# Patient Record
Sex: Female | Born: 1960 | Race: White | Hispanic: No | Marital: Single | Smoking: Never smoker
Health system: Southern US, Community
[De-identification: ages and names within clinical notes are randomized; demographics above are authoritative.]

## PROBLEM LIST (undated history)

## (undated) DIAGNOSIS — I10 Essential (primary) hypertension: Secondary | ICD-10-CM

## (undated) DIAGNOSIS — F329 Major depressive disorder, single episode, unspecified: Secondary | ICD-10-CM

## (undated) DIAGNOSIS — M1990 Unspecified osteoarthritis, unspecified site: Secondary | ICD-10-CM

## (undated) DIAGNOSIS — G43909 Migraine, unspecified, not intractable, without status migrainosus: Secondary | ICD-10-CM

## (undated) DIAGNOSIS — H269 Unspecified cataract: Secondary | ICD-10-CM

## (undated) DIAGNOSIS — Z8669 Personal history of other diseases of the nervous system and sense organs: Secondary | ICD-10-CM

## (undated) DIAGNOSIS — F32A Depression, unspecified: Secondary | ICD-10-CM

## (undated) DIAGNOSIS — Z9849 Cataract extraction status, unspecified eye: Secondary | ICD-10-CM

## (undated) DIAGNOSIS — K219 Gastro-esophageal reflux disease without esophagitis: Secondary | ICD-10-CM

## (undated) DIAGNOSIS — Z9884 Bariatric surgery status: Secondary | ICD-10-CM

## (undated) DIAGNOSIS — M25569 Pain in unspecified knee: Secondary | ICD-10-CM

## (undated) DIAGNOSIS — E559 Vitamin D deficiency, unspecified: Secondary | ICD-10-CM

## (undated) HISTORY — DX: Pain in unspecified knee: M25.569

## (undated) HISTORY — DX: Unspecified osteoarthritis, unspecified site: M19.90

## (undated) HISTORY — DX: Vitamin D deficiency, unspecified: E55.9

## (undated) HISTORY — DX: Bariatric surgery status: Z98.84

## (undated) HISTORY — DX: Essential (primary) hypertension: I10

## (undated) HISTORY — DX: Migraine, unspecified, not intractable, without status migrainosus: G43.909

## (undated) HISTORY — PX: BARIATRIC SURGERY: SHX1103

## (undated) HISTORY — PX: KNEE SURGERY: SHX244

## (undated) HISTORY — DX: Personal history of other diseases of the nervous system and sense organs: Z86.69

## (undated) HISTORY — PX: WRIST SURGERY: SHX841

## (undated) HISTORY — DX: Major depressive disorder, single episode, unspecified: F32.9

## (undated) HISTORY — DX: Cataract extraction status, unspecified eye: Z98.49

## (undated) HISTORY — DX: Depression, unspecified: F32.A

## (undated) HISTORY — PX: TONSILLECTOMY: SUR1361

## (undated) HISTORY — DX: Gastro-esophageal reflux disease without esophagitis: K21.9

## (undated) HISTORY — PX: ABDOMINAL HYSTERECTOMY: SHX81

## (undated) HISTORY — DX: Unspecified cataract: H26.9

---

## 2007-07-26 DIAGNOSIS — F32A Depression, unspecified: Secondary | ICD-10-CM | POA: Insufficient documentation

## 2007-07-26 DIAGNOSIS — F329 Major depressive disorder, single episode, unspecified: Secondary | ICD-10-CM | POA: Insufficient documentation

## 2007-07-26 DIAGNOSIS — K279 Peptic ulcer, site unspecified, unspecified as acute or chronic, without hemorrhage or perforation: Secondary | ICD-10-CM | POA: Insufficient documentation

## 2007-07-27 DIAGNOSIS — M1990 Unspecified osteoarthritis, unspecified site: Secondary | ICD-10-CM

## 2007-07-27 HISTORY — DX: Unspecified osteoarthritis, unspecified site: M19.90

## 2007-10-27 DIAGNOSIS — K219 Gastro-esophageal reflux disease without esophagitis: Secondary | ICD-10-CM | POA: Insufficient documentation

## 2007-10-27 HISTORY — DX: Gastro-esophageal reflux disease without esophagitis: K21.9

## 2009-02-08 DIAGNOSIS — J309 Allergic rhinitis, unspecified: Secondary | ICD-10-CM | POA: Insufficient documentation

## 2010-05-22 DIAGNOSIS — I10 Essential (primary) hypertension: Secondary | ICD-10-CM | POA: Insufficient documentation

## 2010-05-22 DIAGNOSIS — E119 Type 2 diabetes mellitus without complications: Secondary | ICD-10-CM | POA: Insufficient documentation

## 2010-05-22 DIAGNOSIS — R7301 Impaired fasting glucose: Secondary | ICD-10-CM | POA: Insufficient documentation

## 2011-01-28 DIAGNOSIS — G43009 Migraine without aura, not intractable, without status migrainosus: Secondary | ICD-10-CM | POA: Insufficient documentation

## 2011-01-28 DIAGNOSIS — IMO0002 Reserved for concepts with insufficient information to code with codable children: Secondary | ICD-10-CM | POA: Insufficient documentation

## 2011-04-20 ENCOUNTER — Ambulatory Visit: Payer: Self-pay | Admitting: Family Medicine

## 2011-05-30 DIAGNOSIS — M25569 Pain in unspecified knee: Secondary | ICD-10-CM

## 2011-05-30 DIAGNOSIS — Z9884 Bariatric surgery status: Secondary | ICD-10-CM | POA: Insufficient documentation

## 2011-05-30 HISTORY — DX: Pain in unspecified knee: M25.569

## 2011-12-02 DIAGNOSIS — E785 Hyperlipidemia, unspecified: Secondary | ICD-10-CM | POA: Insufficient documentation

## 2012-10-12 DIAGNOSIS — E559 Vitamin D deficiency, unspecified: Secondary | ICD-10-CM

## 2012-10-12 HISTORY — DX: Vitamin D deficiency, unspecified: E55.9

## 2013-11-15 DIAGNOSIS — D509 Iron deficiency anemia, unspecified: Secondary | ICD-10-CM | POA: Insufficient documentation

## 2013-11-15 DIAGNOSIS — M170 Bilateral primary osteoarthritis of knee: Secondary | ICD-10-CM

## 2013-11-20 ENCOUNTER — Ambulatory Visit: Payer: Self-pay | Admitting: Family Medicine

## 2013-11-25 ENCOUNTER — Ambulatory Visit: Payer: Self-pay | Admitting: Orthopedic Surgery

## 2014-11-24 ENCOUNTER — Ambulatory Visit: Payer: Self-pay | Admitting: Emergency Medicine

## 2014-12-26 DIAGNOSIS — IMO0002 Reserved for concepts with insufficient information to code with codable children: Secondary | ICD-10-CM | POA: Insufficient documentation

## 2014-12-26 DIAGNOSIS — H2513 Age-related nuclear cataract, bilateral: Secondary | ICD-10-CM | POA: Insufficient documentation

## 2014-12-26 DIAGNOSIS — H25043 Posterior subcapsular polar age-related cataract, bilateral: Secondary | ICD-10-CM | POA: Insufficient documentation

## 2014-12-26 DIAGNOSIS — H1851 Endothelial corneal dystrophy: Secondary | ICD-10-CM

## 2014-12-26 DIAGNOSIS — H18519 Endothelial corneal dystrophy, unspecified eye: Secondary | ICD-10-CM | POA: Insufficient documentation

## 2014-12-26 DIAGNOSIS — H3530 Unspecified macular degeneration: Secondary | ICD-10-CM | POA: Insufficient documentation

## 2015-01-20 DIAGNOSIS — Z961 Presence of intraocular lens: Secondary | ICD-10-CM | POA: Insufficient documentation

## 2015-01-20 DIAGNOSIS — Z9841 Cataract extraction status, right eye: Secondary | ICD-10-CM

## 2015-01-20 DIAGNOSIS — Z9849 Cataract extraction status, unspecified eye: Secondary | ICD-10-CM

## 2015-01-20 HISTORY — DX: Cataract extraction status, unspecified eye: Z98.49

## 2015-02-10 DIAGNOSIS — Z9842 Cataract extraction status, left eye: Secondary | ICD-10-CM

## 2015-02-10 DIAGNOSIS — Z961 Presence of intraocular lens: Secondary | ICD-10-CM | POA: Insufficient documentation

## 2015-08-30 ENCOUNTER — Encounter: Payer: Self-pay | Admitting: Pain Medicine

## 2015-08-30 ENCOUNTER — Ambulatory Visit: Payer: BC Managed Care – PPO | Attending: Pain Medicine | Admitting: Pain Medicine

## 2015-08-30 VITALS — BP 140/89 | HR 73 | Temp 98.8°F | Resp 18 | Ht 66.0 in | Wt 235.0 lb

## 2015-08-30 DIAGNOSIS — M25562 Pain in left knee: Secondary | ICD-10-CM | POA: Insufficient documentation

## 2015-08-30 DIAGNOSIS — M159 Polyosteoarthritis, unspecified: Secondary | ICD-10-CM | POA: Insufficient documentation

## 2015-08-30 DIAGNOSIS — K219 Gastro-esophageal reflux disease without esophagitis: Secondary | ICD-10-CM | POA: Diagnosis not present

## 2015-08-30 DIAGNOSIS — G8929 Other chronic pain: Secondary | ICD-10-CM

## 2015-08-30 DIAGNOSIS — F1190 Opioid use, unspecified, uncomplicated: Secondary | ICD-10-CM | POA: Insufficient documentation

## 2015-08-30 DIAGNOSIS — Z79899 Other long term (current) drug therapy: Secondary | ICD-10-CM

## 2015-08-30 DIAGNOSIS — M6249 Contracture of muscle, multiple sites: Secondary | ICD-10-CM

## 2015-08-30 DIAGNOSIS — M25561 Pain in right knee: Secondary | ICD-10-CM | POA: Insufficient documentation

## 2015-08-30 DIAGNOSIS — Z8669 Personal history of other diseases of the nervous system and sense organs: Secondary | ICD-10-CM

## 2015-08-30 DIAGNOSIS — M62838 Other muscle spasm: Secondary | ICD-10-CM | POA: Insufficient documentation

## 2015-08-30 DIAGNOSIS — F1120 Opioid dependence, uncomplicated: Secondary | ICD-10-CM

## 2015-08-30 DIAGNOSIS — M150 Primary generalized (osteo)arthritis: Secondary | ICD-10-CM

## 2015-08-30 DIAGNOSIS — G43909 Migraine, unspecified, not intractable, without status migrainosus: Secondary | ICD-10-CM | POA: Insufficient documentation

## 2015-08-30 DIAGNOSIS — I10 Essential (primary) hypertension: Secondary | ICD-10-CM | POA: Diagnosis not present

## 2015-08-30 DIAGNOSIS — Z9884 Bariatric surgery status: Secondary | ICD-10-CM | POA: Diagnosis not present

## 2015-08-30 DIAGNOSIS — M7918 Myalgia, other site: Secondary | ICD-10-CM | POA: Insufficient documentation

## 2015-08-30 DIAGNOSIS — M25569 Pain in unspecified knee: Secondary | ICD-10-CM | POA: Insufficient documentation

## 2015-08-30 DIAGNOSIS — Z0289 Encounter for other administrative examinations: Secondary | ICD-10-CM

## 2015-08-30 DIAGNOSIS — Z5181 Encounter for therapeutic drug level monitoring: Secondary | ICD-10-CM | POA: Insufficient documentation

## 2015-08-30 DIAGNOSIS — Z6841 Body Mass Index (BMI) 40.0 and over, adult: Secondary | ICD-10-CM | POA: Insufficient documentation

## 2015-08-30 DIAGNOSIS — F329 Major depressive disorder, single episode, unspecified: Secondary | ICD-10-CM | POA: Diagnosis not present

## 2015-08-30 DIAGNOSIS — M791 Myalgia: Secondary | ICD-10-CM

## 2015-08-30 DIAGNOSIS — Z79891 Long term (current) use of opiate analgesic: Secondary | ICD-10-CM | POA: Insufficient documentation

## 2015-08-30 DIAGNOSIS — F1990 Other psychoactive substance use, unspecified, uncomplicated: Secondary | ICD-10-CM

## 2015-08-30 HISTORY — DX: Personal history of other diseases of the nervous system and sense organs: Z86.69

## 2015-08-30 HISTORY — DX: Bariatric surgery status: Z98.84

## 2015-08-30 MED ORDER — HYDROCODONE-ACETAMINOPHEN 7.5-325 MG PO TABS: 1.0000 | ORAL_TABLET | Freq: Three times a day (TID) | ORAL | Status: DC | PRN

## 2015-08-30 NOTE — Progress Notes (Signed)
Safety precautions to be maintained throughout the outpatient stay will include: orient to surroundings, keep bed in low position, maintain call bell within reach at all times, provide assistance with transfer out of bed and ambulation. Hydrocodone pill count # [phone]

## 2015-08-30 NOTE — Patient Instructions (Signed)
Radiofrequency Lesioning Radiofrequency lesioning is a procedure that is performed to relieve pain. The procedure is often used for back, neck, or arm pain. Radiofrequency lesioning involves the use of a machine that creates radio waves to make heat. During the procedure, the heat is applied to the nerve that carries the pain signal. The heat damages the nerve and interferes with the pain signal. Pain relief usually lasts for 6 months to 1 year. LET YOUR HEALTH CARE PROVIDER KNOW ABOUT:  Any allergies you have.  All medicines you are taking, including vitamins, herbs, eye drops, creams, and over-the-counter medicines.  Previous problems you or members of your family have had with the use of anesthetics.  Any blood disorders you have.  Previous surgeries you have had.  Any medical conditions you have.  Whether you are pregnant or may be pregnant. RISKS AND COMPLICATIONS Generally, this is a safe procedure. However, problems may occur, including:  Pain or soreness at the injection site.  Infection at the injection site.  Damage to nerves or blood vessels. BEFORE THE PROCEDURE  Ask your health care provider about:  Changing or stopping your regular medicines. This is especially important if you are taking diabetes medicines or blood thinners.  Taking medicines such as aspirin and ibuprofen. These medicines can thin your blood. Do not take these medicines before your procedure if your health care provider instructs you not to.  Follow instructions from your health care provider about eating or drinking restrictions.  Plan to have someone take you home after the procedure.  If you go home right after the procedure, plan to have someone with you for 24 hours. PROCEDURE  You will be given one or more of the following:  A medicine to help you relax (sedative).  A medicine to numb the area (local anesthetic).  You will be awake during the procedure. You will need to be able to  talk with the health care provider during the procedure.  With the help of a type of X-ray (fluoroscopy), the health care provider will insert a radiofrequency needle into the area to be treated.  Next, a wire that carries the radio waves (electrode) will be put through the radiofrequency needle. An electrical pulse will be sent through the electrode to verify the correct nerve. You will feel a tingling sensation, and you may have muscle twitching.  Then, the tissue that is around the needle tip will be heated by an electric current that is passed using the radiofrequency machine. This will numb the nerves.  A bandage (dressing) will be put on the insertion area after the procedure is done. The procedure may vary among health care providers and hospitals. AFTER THE PROCEDURE  Your blood pressure, heart rate, breathing rate, and blood oxygen level will be monitored often until the medicines you were given have worn off.  Return to your normal activities as directed by your health care provider.   This information is not intended to replace advice given to you by your health care provider. Make sure you discuss any questions you have with your health care provider.   Document Released: 06/19/2011 Document Revised: 07/12/2015 Document Reviewed: 11/28/2014 Elsevier Interactive Patient Education 2016 [employer] Inc[employer]  

## 2015-08-30 NOTE — Progress Notes (Signed)
Patient's Name: Saloma A Pett MRN: [id number] MAYEDAebruary 17, 1962 DOS: 08/30/2015  Primary Reason(s) for Visit: Encounter for Medication Management. CC: Joint Swelling   HPI:   Ms. Gerken is a 54 y.o. year old, female patient, who returns today as an established patient. She has Chronic pain; Opiate use; Opiate dependence (HCC); Long term prescription opiate use; Long term current use of opiate analgesic; Encounter for therapeutic drug level monitoring; Bilateral chronic knee pain; Status post bariatric surgery; Morbid obesity (HCC); Osteoarthrosis; History of migraine; Substance Use Disorder Risk:; Cataract, nuclear; Allergic rhinitis; Age-related macular degeneration; Arthritis; Atypical migraine; Cornea guttata; Clinical depression; Acid reflux; Bariatric surgery status; HLD (hyperlipidemia); BP (high blood pressure); Gonalgia; Arthritis of knee, degenerative; Gastroduodenal ulcer; Avitaminosis D; H/O cataract extraction; Cataract, post subcapsular polar senile; Opioid use agreement exists; Myofascial pain; Muscle spasms of lower extremity; and GERD (gastroesophageal reflux disease) on her problem list.. Her primarily concern today is the Joint Swelling   The patient returns to the clinic today after having been seen by the medical psychologist for the substance use disorder evaluation. The evaluation came back indicating that her risk is low. Today we have signed and an opioid contract with the patient and we have informed the patient about the rules and regulations of the medication management program. The patient has an orthopedic surgeon that we will be following her just in case she gets to the point where she may need some knee replacements. She was also seen a sports medicine physician that was doing her injections. However, because were now taking care of her medication, we will take over all of her interventional treatments as well. The patient was informed not to go back to have any more Synvisc  injections, we will take over and do those here from now on. Today we talked about different alternatives such as steroid injections, viscoelastic treatments, genicular nerve blocks, genicular nerve RFA, and even spinal cord stimulation as possible options. Following this I went ahead and also spoke to her about her medication program. For now she indicates that she is doing well and does not even need any injections. Today we will provide her with enough medication to last for 3 months at which time we'll see her back for reevaluation. Today I also spoke to the patient about drug holidays as a way to treat medication tolerance.  Pharmacotherapy Review: Side-effects or Adverse reactions: None reported. Effectiveness: Described as relatively effective, allowing for increase in activities of daily living (ADL). Onset of action: Within expected pharmacological parameters. Duration of action: Within normal limits for medication. Peak effect: Timing and results are as within normal expected parameters. [place] PMP: Compliant with practice rules and regulations. DST: Compliant with practice rules and regulations. Lab work: No new labs ordered by our practice. Treatment compliance: Compliant. Substance Use Disorder (SUD) Risk Level: Low Planned course of action: Continue therapy as is.  Allergies: Ms. Lombard is allergic to diphenhydramine; morphine and related; nsaids; tetanus toxoids; morphine; and naproxen.  Meds: The patient has a current medication list which includes the following prescription(s): amlodipine, atenolol, calcium carbonate antacid, vitamin d, hydrocodone-acetaminophen, losartan-hydrochlorothiazide, magnesium, magnesium-chelated zinc, multivitamin, nortriptyline, omeprazole, sertraline, tizanidine, vitamin b-12, hydrocodone-acetaminophen, and hydrocodone-acetaminophen. Requested Prescriptions   Signed Prescriptions Disp Refills  . HYDROcodone-acetaminophen (NORCO) 7.5-325 MG tablet 90  tablet 0    Sig: Take 1 tablet by mouth every 8 (eight) hours as needed for moderate pain.  Marland Kitchen HYDROcodone-acetaminophen (NORCO) 7.5-325 MG tablet 90 tablet 0    Sig: Take 1 tablet  by mouth every 8 (eight) hours as needed for moderate pain.    ROS: Constitutional: Afebrile, no chills, well hydrated and well nourished Gastrointestinal: negative Musculoskeletal:negative Neurological: negative Behavioral/Psych: negative  PFSH: Medical:  Ms. [facility]  has a past medical history of Osteoarthritis; Cataract; Depression; Hypertension; Migraines; Status post bariatric surgery (08/30/2015); and History of migraine (08/30/2015). Family: family history includes Cancer in her mother; Heart disease in her father. Surgical:  has past surgical history that includes Abdominal hysterectomy; Bariatric Surgery; Wrist surgery; Knee surgery; and Tonsillectomy. Tobacco:  reports that she has never smoked. She does not have any smokeless tobacco history on file. Alcohol:  reports that she does not drink alcohol. Drug:  reports that she does not use illicit drugs.  Physical Exam: Vitals:  Today's Vitals   08/30/15 0751 08/30/15 0753  BP: 140/89   Pulse: 73   Temp: 98.8 F (37.1 C)   Resp: 18   Height:  (1.676 m)   Weight: 235 lb (106.595 kg)   SpO2: 100%   PainSc: 4  4   PainLoc: Knee   Calculated BMI: Body mass index is 37.95 kg/(m^2). General appearance: alert, cooperative, appears stated age, mild distress and morbidly obese Eyes: conjunctivae/corneas clear. PERRL, EOM's intact. Fundi benign. Lungs: No evidence respiratory distress, no audible rales or ronchi and no use of accessory muscles of respiration Neck: no adenopathy, no carotid bruit, no JVD, supple, symmetrical, trachea midline and thyroid not enlarged, symmetric, no tenderness/mass/nodules Back: symmetric, no curvature. ROM normal. No CVA tenderness. Extremities: extremities normal, atraumatic, no cyanosis or edema Pulses: 2+ and  symmetric Skin: Skin color, texture, turgor normal. No rashes or lesions Neurologic: Grossly normal    Assessment: Encounter Diagnosis:  Primary Diagnosis: Chronic pain [G89.29]  Plan: Meryle was seen today for joint swelling.  Diagnoses and all orders for this visit:  Chronic pain -     HYDROcodone-acetaminophen (NORCO) 7.5-325 MG tablet; Take 1 tablet by mouth every 8 (eight) hours as needed for moderate pain. -     HYDROcodone-acetaminophen (NORCO) 7.5-325 MG tablet; Take 1 tablet by mouth every 8 (eight) hours as needed for moderate pain.  Opiate use  Uncomplicated opioid dependence (HCC)  Long term prescription opiate use  Long term current use of opiate analgesic  Encounter for therapeutic drug level monitoring  Bilateral chronic knee pain -     KNEE INJECTION; Standing  Status post bariatric surgery  Morbid obesity due to excess calories (HCC)  History of migraine  Substance Use Disorder Risk: Low  Opioid use agreement exists  Myofascial pain  Muscle spasms of lower extremity, unspecified laterality  Gastroesophageal reflux disease, esophagitis presence not specified     Patient Instructions  Radiofrequency Lesioning Radiofrequency lesioning is a procedure that is performed to relieve pain. The procedure is often used for back, neck, or arm pain. Radiofrequency lesioning involves the use of a machine that creates radio waves to make heat. During the procedure, the heat is applied to the nerve that carries the pain signal. The heat damages the nerve and interferes with the pain signal. Pain relief usually lasts for 6 months to 1 year. LET [facility] CARE PROVIDER KNOW ABOUT:  Any allergies you have.  All medicines you are taking, including vitamins, herbs, eye drops, creams, and over-the-counter medicines.  Previous problems you or members of your family have had with the use of anesthetics.  Any blood disorders you have.  Previous surgeries you  have had.  Any medical conditions you  have.  Whether you are pregnant or may be pregnant. RISKS AND COMPLICATIONS Generally, this is a safe procedure. However, problems may occur, including:  Pain or soreness at the injection site.  Infection at the injection site.  Damage to nerves or blood vessels. BEFORE THE PROCEDURE  Ask your health care provider about:  Changing or stopping your regular medicines. This is especially important if you are taking diabetes medicines or blood thinners.  Taking medicines such as aspirin and ibuprofen. These medicines can thin your blood. Do not take these medicines before your procedure if your health care provider instructs you not to.  Follow instructions from your health care provider about eating or drinking restrictions.  Plan to have someone take you home after the procedure.  If you go home right after the procedure, plan to have someone with you for 24 hours. PROCEDURE  You will be given one or more of the following:  A medicine to help you relax (sedative).  A medicine to numb the area (local anesthetic).  You will be awake during the procedure. You will need to be able to talk with the health care provider during the procedure.  With the help of a type of X-ray (fluoroscopy), the health care provider will insert a radiofrequency needle into the area to be treated.  Next, a wire that carries the radio waves (electrode) will be put through the radiofrequency needle. An electrical pulse will be sent through the electrode to verify the correct nerve. You will feel a tingling sensation, and you may have muscle twitching.  Then, the tissue that is around the needle tip will be heated by an electric current that is passed using the radiofrequency machine. This will numb the nerves.  A bandage (dressing) will be put on the insertion area after the procedure is done. The procedure may vary among health care providers and hospitals. AFTER  THE PROCEDURE  Your blood pressure, heart rate, breathing rate, and blood oxygen level will be monitored often until the medicines you were given have worn off.  Return to your normal activities as directed by your health care provider.   This information is not intended to replace advice given to you by your health care provider. Make sure you discuss any questions you have with your health care provider.   Document Released: 06/19/2011 Document Revised: 07/12/2015 Document Reviewed: 11/28/2014 Elsevier Interactive Patient Education 2016 [employer]    Medications discontinued today:  Medications Discontinued During This Encounter  Medication Reason  . Magnesium 400 MG TABS Error  . sertraline (ZOLOFT) 20 MG/ML concentrated solution Error   Medications administered today:  Ms. Fredric MareBailey had no medications administered during this visit.  Primary Care Physician: Michelene GardenerSANDBULTE, ZACHARY W, MD [facility] Note by: Sydnee LevansFrancisco A. Laban EmperorNaveira, M.D, DABA, DABAPM, DABPM, DABIPP, FIPP

## 2015-11-22 ENCOUNTER — Ambulatory Visit: Payer: BC Managed Care – PPO | Attending: Pain Medicine | Admitting: Pain Medicine

## 2015-11-22 ENCOUNTER — Other Ambulatory Visit: Payer: Self-pay | Admitting: Pain Medicine

## 2015-11-22 ENCOUNTER — Encounter: Payer: Self-pay | Admitting: Pain Medicine

## 2015-11-22 VITALS — BP 133/89 | HR 76 | Temp 98.6°F | Resp 16 | Ht 66.0 in | Wt 240.0 lb

## 2015-11-22 DIAGNOSIS — M170 Bilateral primary osteoarthritis of knee: Secondary | ICD-10-CM

## 2015-11-22 DIAGNOSIS — Z5181 Encounter for therapeutic drug level monitoring: Secondary | ICD-10-CM

## 2015-11-22 DIAGNOSIS — M25561 Pain in right knee: Secondary | ICD-10-CM | POA: Diagnosis not present

## 2015-11-22 DIAGNOSIS — M25562 Pain in left knee: Secondary | ICD-10-CM

## 2015-11-22 DIAGNOSIS — F329 Major depressive disorder, single episode, unspecified: Secondary | ICD-10-CM | POA: Diagnosis not present

## 2015-11-22 DIAGNOSIS — E559 Vitamin D deficiency, unspecified: Secondary | ICD-10-CM | POA: Diagnosis not present

## 2015-11-22 DIAGNOSIS — Z9884 Bariatric surgery status: Secondary | ICD-10-CM | POA: Diagnosis not present

## 2015-11-22 DIAGNOSIS — E785 Hyperlipidemia, unspecified: Secondary | ICD-10-CM | POA: Diagnosis not present

## 2015-11-22 DIAGNOSIS — M25539 Pain in unspecified wrist: Secondary | ICD-10-CM | POA: Diagnosis present

## 2015-11-22 DIAGNOSIS — M25569 Pain in unspecified knee: Secondary | ICD-10-CM | POA: Diagnosis present

## 2015-11-22 DIAGNOSIS — I10 Essential (primary) hypertension: Secondary | ICD-10-CM | POA: Insufficient documentation

## 2015-11-22 DIAGNOSIS — G8929 Other chronic pain: Secondary | ICD-10-CM | POA: Insufficient documentation

## 2015-11-22 DIAGNOSIS — Z79891 Long term (current) use of opiate analgesic: Secondary | ICD-10-CM

## 2015-11-22 LAB — COMPREHENSIVE METABOLIC PANEL
ALBUMIN: 4.3 g/dL (ref 3.5–5.0)
ALT: 20 U/L (ref 14–54)
ANION GAP: 10 (ref 5–15)
AST: 23 U/L (ref 15–41)
Alkaline Phosphatase: 114 U/L (ref 38–126)
BUN: 12 mg/dL (ref 6–20)
CO2: 29 mmol/L (ref 22–32)
Calcium: 9.6 mg/dL (ref 8.9–10.3)
Chloride: 102 mmol/L (ref 101–111)
Creatinine, Ser: 0.92 mg/dL (ref 0.44–1.00)
GFR calc Af Amer: >60 mL/min (ref >60)
GFR calc non Af Amer: >60 mL/min (ref >60)
GLUCOSE: 93 mg/dL (ref 65–99)
POTASSIUM: 3.7 mmol/L (ref 3.5–5.1)
SODIUM: 141 mmol/L (ref 135–145)
Total Bilirubin: 0.7 mg/dL (ref 0.3–1.2)
Total Protein: 7.6 g/dL (ref 6.5–8.1)

## 2015-11-22 LAB — MAGNESIUM: Magnesium: 2.2 mg/dL (ref 1.7–2.4)

## 2015-11-22 LAB — SEDIMENTATION RATE: SED RATE: 41 mm/h — AB (ref 0–30)

## 2015-11-22 LAB — C-REACTIVE PROTEIN: CRP: 1.6 mg/dL — ABNORMAL HIGH (ref <1.0)

## 2015-11-22 MED ORDER — HYDROCODONE-ACETAMINOPHEN 7.5-325 MG PO TABS: 1.0000 | ORAL_TABLET | Freq: Three times a day (TID) | ORAL | Status: DC | PRN

## 2015-11-22 NOTE — Progress Notes (Signed)
Safety precautions to be maintained throughout the outpatient stay will include: orient to surroundings, keep bed in low position, maintain call bell within reach at all times, provide assistance with transfer out of bed and ambulation. Hydrocodone pill count # 7

## 2015-11-22 NOTE — Assessment & Plan Note (Signed)
The patient suffers from bilateral knee osteoarthritis with the left being worst on the right. In the past she has had injections with steroids as well as Synvisc. This injection were done by Dr. Tasia Catchings. She has been recommended to have knee replacements. At this point she is trying to avoid surgery as much as possible. The patient has been counseled on loosing weight and bringing her BMI to less than 30. We have also added standing orders for knee injections, should she need those. Along with her medication management we will be taking over all of her interventional injection treatments.

## 2015-11-22 NOTE — Progress Notes (Signed)
Patient's Name: Sophia Jackson MRN: [id number] DOB: 1961-03-14 DOS: 11/22/2015  Primary Reason(s) for Visit: Encounter for Medication Management CC: Knee Pain and Wrist Pain   HPI:  Ms. Talmadge is a 55 y.o. year old, female patient, who returns today as an established patient. She has Chronic pain; Opiate use; Opiate dependence (HCC); Long term prescription opiate use; Long term current use of opiate analgesic; Encounter for therapeutic drug level monitoring; Chronic knee pain  (Location of Primary Source of Pain) (Bilateral) (L>R); Status post bariatric surgery; Morbid obesity (HCC); Osteoarthrosis; Substance Use Disorder Risk:; Cataract, nuclear; Allergic rhinitis; Age-related macular degeneration; Common Migraine (Without Aura); Cornea guttata; Clinical depression; Bariatric surgery status; HLD (hyperlipidemia); Hypertension; Arthritis of knee, degenerative (Bilateral); Gastroduodenal ulcer (Peptic Ulcer); Cataract, post subcapsular polar senile; Opioid use agreement exists; Myofascial pain; Muscle spasms of lower extremity; GERD (gastroesophageal reflux disease); Encounter for chronic pain management; Osteoarthritis of knee (Bilateral) (L>R); and Vitamin D insufficiency on her problem list.. Her primarily concern today is the Knee Pain and Wrist Pain   The patient comes to the clinics today for pharmacological management of her chronic pain. Today she has also requested for [place] to give her an opinion with her nurse to "[facility]", which she heard of on TV. We went over to their website and I went over some of the treatments that they performed and they seem to be fairly standard.  Reported Pain Score: 4 , clinically she looks like a 1-2/10. Reported level is inconsistent with clinical obrservations. Pain Type: Chronic pain Pain Location: Knee Pain Orientation:  (bilateral) Pain Descriptors / Indicators: Sharp, Constant Pain Frequency: Constant  Date of Last Visit: 08/30/15 Service Provided on  Last Visit: Med Refill  Pharmacotherapy  Review:   Onset of action: Within expected pharmacological parameters Time to Peak effect: Timing and results are as within normal expected parameters Effectiveness: Described as relatively effective, allowing for increase in activities of daily living (ADL) % Relief: More than 50% Side-effects or Adverse reactions: None reported Duration of action: Within normal limits for medication  PMP: Compliant with practice rules and regulations UDS Results: No UDS available, at this time UDS Interpretation: No UDS available, at this time Medication Assessment Form: Reviewed. Patient indicates being compliant with therapy Treatment compliance: Compliant Substance Use Disorder (SUD) Risk Level: Low Pharmacologic Plan: Continue therapy as is  Lab Work: Illicit Drugs No results found for: THCU, COCAINSCRNUR, PCPSCRNUR, MDMA, AMPHETMU, METHADONE, ETOH  Inflammation Markers No results found for: ESRSEDRATE, CRP  Renal Function Lab Results  Component Value Date   BUN 12 11/22/2015   CREATININE 0.92 11/22/2015   GFRAA >60 11/22/2015   GFRNONAA >60 11/22/2015    Hepatic Function Lab Results  Component Value Date   AST 23 11/22/2015   ALT 20 11/22/2015   ALBUMIN 4.3 11/22/2015    Electrolytes Lab Results  Component Value Date   NA 141 11/22/2015   K 3.7 11/22/2015   CL 102 11/22/2015   CALCIUM 9.6 11/22/2015   MG 2.2 11/22/2015     Allergies:  Ms. Delia is allergic to diphenhydramine; ibuprofen; morphine and related; nsaids; tetanus toxoids; 2,4-d dimethylamine (amisol); diclofenac; diphenhydramine hcl; morphine; and naproxen.  Meds:  The patient has a current medication list which includes the following prescription(s): amlodipine, atenolol, calcium carbonate, vitamin d, cyanocobalamin, hydrocodone-acetaminophen, hydrocodone-acetaminophen, hydrocodone-acetaminophen, losartan-hydrochlorothiazide, magnesium, megared omega-3 krill oil,  multivitamin, nortriptyline, omeprazole, sertraline, magnesium (amino acid chelate), and tizanidine.  ROS:  Constitutional: Afebrile, no chills, well hydrated and well nourished Gastrointestinal:  negative Musculoskeletal:negative Neurological: negative Behavioral/Psych: negative  PFSH:  Medical:  Ms. Savell  has a past medical history of Osteoarthritis; Cataract; Depression; Hypertension; Migraines; Status post bariatric surgery (08/30/2015); History of migraine (08/30/2015); Gonalgia (05/30/2011); H/O cataract extraction (01/20/2015); Arthritis (07/27/2007); Acid reflux (10/27/2007); and Avitaminosis D (10/12/2012). Family: family history includes Cancer in her mother; Heart disease in her father. Surgical:  has past surgical history that includes Abdominal hysterectomy; Bariatric Surgery; Wrist surgery; Knee surgery; and Tonsillectomy. Tobacco:  reports that she has never smoked. She does not have any smokeless tobacco history on file. Alcohol:  reports that she does not drink alcohol. Drug:  reports that she does not use illicit drugs.  Physical Exam:  Vitals:  Today's Vitals   11/22/15 0803 11/22/15 0805  BP: 133/89   Pulse: 76   Temp: 98.6 F (37 C)   Resp: 16   Height:  (1.676 m)   Weight: 240 lb (108.863 kg)   SpO2: 100%   PainSc: 4  4   PainLoc: Knee     Calculated BMI: Body mass index is 38.76 kg/(m^2).  General appearance: alert, cooperative, appears stated age, no distress and morbidly obese Eyes: PERLA Respiratory: No evidence respiratory distress, no audible rales or ronchi and no use of accessory muscles of respiration  Cervical Spine Inspection: Normal anatomy Alignment: Symetrical Palpation: WNL ROM: Adequate  Upper Extremities Inspection: No gross anomalies detected ROM: Adequate Sensory: Normal Motor: Unremarkable  Thoracic Spine Inspection: No gross anomalies detected Alignment: Symetrical Palpation: WNL ROM: Adequate  Lumbar  Spine Inspection: No gross anomalies detected Alignment: Symetrical Palpation: WNL ROM: Adequate Gait: Antalgic (limping)  Lower Extremities Inspection: No gross anomalies detected ROM: Decreased on both knees, possibly due to guarding. Sensory: Normal Motor: Unremarkable  Assessment & Plan:  Primary Diagnosis & Pertinent Problem List: The primary encounter diagnosis was Chronic pain. Diagnoses of Chronic knee pain  (Location of Primary Source of Pain) (Bilateral) (L>R), Long term current use of opiate analgesic, Encounter for therapeutic drug level monitoring, Vitamin D insufficiency, and Primary osteoarthritis of both knees were also pertinent to this visit.  Assessment: Osteoarthritis of knee (Bilateral) (L>R) The patient suffers from bilateral knee osteoarthritis with the left being worst on the right. In the past she has had injections with steroids as well as Synvisc. This injection were done by Dr. Tasia Catchings. She has been recommended to have knee replacements. At this point she is trying to avoid surgery as much as possible. The patient has been counseled on loosing weight and bringing her BMI to less than 30. We have also added standing orders for knee injections, should she need those. Along with her medication management we will be taking over all of her interventional injection treatments.    Pharmacotherapy Orders: Meds ordered this encounter  Medications  . HYDROcodone-acetaminophen (NORCO) 7.5-325 MG tablet    Sig: Take 1 tablet by mouth every 8 (eight) hours as needed for moderate pain or severe pain.    Dispense:  90 tablet    Refill:  0    Do not place this medication, or any other prescription from our practice, on "Automatic Refill". Patient may have prescription filled one day early if pharmacy is closed on scheduled refill date. Do not fill until: 11/23/15 To last until: 12/23/15  . HYDROcodone-acetaminophen (NORCO) 7.5-325 MG tablet    Sig: Take 1 tablet by mouth  every 8 (eight) hours as needed for moderate pain or severe pain.    Dispense:  90 tablet  Refill:  0    Do not place this medication, or any other prescription from our practice, on "Automatic Refill". Patient may have prescription filled one day early if pharmacy is closed on scheduled refill date. Do not fill until: 12/23/15 To last until: 01/19/16  . HYDROcodone-acetaminophen (NORCO) 7.5-325 MG tablet    Sig: Take 1 tablet by mouth every 8 (eight) hours as needed for moderate pain or severe pain.    Dispense:  90 tablet    Refill:  0    Do not place this medication, or any other prescription from our practice, on "Automatic Refill". Patient may have prescription filled one day early if pharmacy is closed on scheduled refill date. Do not fill until: 01/19/16 To last until: 02/18/16    [facility] & Procedure Orders: Orders Placed This Encounter  Procedures  . KNEE INJECTION  . DG Knee 1-2 Views Left  . DG Knee 1-2 Views Right  . Drugs of abuse screen w/o alc, rtn urine-sln  . Comprehensive metabolic panel  . C-reactive protein  . Magnesium  . Sedimentation rate  . Vitamin B12  . Vitamin D pnl(25-hydrxy+1,25-dihy)-bld    Radiology Orders: DG KNEE 1-2 VIEWS LEFT DG KNEE 1-2 VIEWS RIGHT  Interventional Therapies: PRN procedure: Bilateral intra-articular knee injection. (Steroids versus Hyalgan)    Administered Medications: Ms. Starlin had no medications administered during this visit.  Primary Care Physician: Michelene Gardener, MD Location: [facility] Facility Note by: Sydnee Levans Laban Emperor, M.D, DABA, DABAPM, DABPM, DABIPP, FIPP

## 2015-11-24 ENCOUNTER — Encounter: Payer: Self-pay | Admitting: Pain Medicine

## 2015-11-24 DIAGNOSIS — R7982 Elevated C-reactive protein (CRP): Secondary | ICD-10-CM | POA: Insufficient documentation

## 2015-11-24 NOTE — Progress Notes (Signed)

## 2015-11-26 LAB — TOXASSURE SELECT 13 (MW), URINE: PDF: 0

## 2015-12-01 ENCOUNTER — Ambulatory Visit
Admission: RE | Admit: 2015-12-01 | Discharge: 2015-12-01 | Disposition: A | Discharge status: Home / Self Care | Payer: BC Managed Care – PPO | Source: Ambulatory Visit | Attending: Pain Medicine | Admitting: Pain Medicine

## 2015-12-01 DIAGNOSIS — M25562 Pain in left knee: Secondary | ICD-10-CM | POA: Diagnosis present

## 2015-12-01 DIAGNOSIS — G8929 Other chronic pain: Secondary | ICD-10-CM

## 2015-12-01 DIAGNOSIS — M25561 Pain in right knee: Principal | ICD-10-CM

## 2015-12-01 DIAGNOSIS — M170 Bilateral primary osteoarthritis of knee: Secondary | ICD-10-CM | POA: Diagnosis not present

## 2015-12-14 NOTE — Progress Notes (Signed)
Quick Note:  This imaging study has been reviewed and not found to have any major pathology requiring urgent or emergency care. This particular study shows 3 views of the left knee with no acute pathology. There seems to have been a previous fracture of the lateral tibial plateau which has healed. There is no clear joint effusion. Changes observed seems to suggest mild degenerative changes. There is some mild narrowing of the medial joint compartment suggesting some loss of cartilage in the medial portion of the joint. ______

## 2015-12-14 NOTE — Progress Notes (Signed)
Quick Note:  X-rays of the right knee suggest mild to moderate osteoarthritis concentrated on the lateral and patellofemoral compartments of the knee. No major pathology observed. ______

## 2016-02-19 ENCOUNTER — Encounter: Payer: BC Managed Care – PPO | Admitting: Pain Medicine

## 2016-02-21 ENCOUNTER — Encounter: Payer: BC Managed Care – PPO | Admitting: Pain Medicine

## 2016-02-23 ENCOUNTER — Telehealth: Payer: Self-pay | Admitting: Pain Medicine

## 2016-02-23 ENCOUNTER — Ambulatory Visit: Payer: BC Managed Care – PPO | Attending: Pain Medicine | Admitting: Pain Medicine

## 2016-02-23 ENCOUNTER — Encounter: Payer: Self-pay | Admitting: Pain Medicine

## 2016-02-23 VITALS — BP 154/80 | HR 69 | Temp 98.7°F | Resp 16 | Ht 66.0 in | Wt 240.0 lb

## 2016-02-23 DIAGNOSIS — H3530 Unspecified macular degeneration: Secondary | ICD-10-CM | POA: Insufficient documentation

## 2016-02-23 DIAGNOSIS — J309 Allergic rhinitis, unspecified: Secondary | ICD-10-CM | POA: Diagnosis not present

## 2016-02-23 DIAGNOSIS — Z5181 Encounter for therapeutic drug level monitoring: Secondary | ICD-10-CM

## 2016-02-23 DIAGNOSIS — M791 Myalgia: Secondary | ICD-10-CM

## 2016-02-23 DIAGNOSIS — G43909 Migraine, unspecified, not intractable, without status migrainosus: Secondary | ICD-10-CM | POA: Diagnosis not present

## 2016-02-23 DIAGNOSIS — Z9884 Bariatric surgery status: Secondary | ICD-10-CM | POA: Diagnosis not present

## 2016-02-23 DIAGNOSIS — M170 Bilateral primary osteoarthritis of knee: Secondary | ICD-10-CM | POA: Diagnosis not present

## 2016-02-23 DIAGNOSIS — Z79891 Long term (current) use of opiate analgesic: Secondary | ICD-10-CM | POA: Diagnosis not present

## 2016-02-23 DIAGNOSIS — E785 Hyperlipidemia, unspecified: Secondary | ICD-10-CM | POA: Diagnosis not present

## 2016-02-23 DIAGNOSIS — K219 Gastro-esophageal reflux disease without esophagitis: Secondary | ICD-10-CM | POA: Diagnosis not present

## 2016-02-23 DIAGNOSIS — F329 Major depressive disorder, single episode, unspecified: Secondary | ICD-10-CM | POA: Insufficient documentation

## 2016-02-23 DIAGNOSIS — I10 Essential (primary) hypertension: Secondary | ICD-10-CM | POA: Insufficient documentation

## 2016-02-23 DIAGNOSIS — M6249 Contracture of muscle, multiple sites: Secondary | ICD-10-CM

## 2016-02-23 DIAGNOSIS — G8929 Other chronic pain: Secondary | ICD-10-CM | POA: Insufficient documentation

## 2016-02-23 DIAGNOSIS — F1190 Opioid use, unspecified, uncomplicated: Secondary | ICD-10-CM

## 2016-02-23 DIAGNOSIS — M62838 Other muscle spasm: Secondary | ICD-10-CM | POA: Diagnosis not present

## 2016-02-23 DIAGNOSIS — Z90710 Acquired absence of both cervix and uterus: Secondary | ICD-10-CM | POA: Insufficient documentation

## 2016-02-23 DIAGNOSIS — H269 Unspecified cataract: Secondary | ICD-10-CM | POA: Insufficient documentation

## 2016-02-23 DIAGNOSIS — M7918 Myalgia, other site: Secondary | ICD-10-CM

## 2016-02-23 DIAGNOSIS — M25569 Pain in unspecified knee: Secondary | ICD-10-CM | POA: Diagnosis present

## 2016-02-23 MED ORDER — HYDROCODONE-ACETAMINOPHEN 7.5-325 MG PO TABS: 1.0000 | ORAL_TABLET | Freq: Three times a day (TID) | ORAL | Status: DC | PRN

## 2016-02-23 MED ORDER — TIZANIDINE HCL 4 MG PO CAPS: 4.0000 mg | ORAL_CAPSULE | Freq: Every day | ORAL | Status: DC

## 2016-02-23 NOTE — Progress Notes (Signed)
Safety precautions to be maintained throughout the outpatient stay will include: orient to surroundings, keep bed in low position, maintain call bell within reach at all times, provide assistance with transfer out of bed and ambulation.  Pill count 0/90 date filled was 01/22/2016 hydrocodone /apap 7.5-325mg 

## 2016-02-23 NOTE — Telephone Encounter (Signed)
Got msg from pharmacy saying they had script from Dr. Laban EmperorNaveira for Tizanidine, which he normally doesn't write. She canceled the script at the pharmacy. She was not sure why it was sent?

## 2016-02-23 NOTE — Telephone Encounter (Signed)
Spoke with Dr. Laban EmperorNaveira, states he wrote this because she has already been taking it. Patient advised per voicemail that she does not have to fill it if it is not needed.

## 2016-02-23 NOTE — Progress Notes (Signed)
Patient's Name: Sophia Jackson  Patient type: Established  MRN: [id number]  Service setting: Ambulatory outpatient  DOB: 10-Oct-1961  Location: [facility]  DOS: 02/23/2016  Primary Care Physician: Michelene Gardener, MD  Note by: Sydnee Levans Laban Emperor, M.D, DABA, DABAPM, DABPM, DABIPP, FIPP  Referring Physician: Michelene Gardener, MD  Specialty: Board-Certified Interventional Pain Management     Primary Reason(s) for Visit: Encounter for prescription drug management (Level of risk: moderate) CC: Knee Pain   HPI  Sophia Jackson is a 55 y.o. year old, female patient, who returns today as an established patient. She has Chronic pain; Opiate use; Opiate dependence (HCC); Long term prescription opiate use; Long term current use of opiate analgesic; Encounter for therapeutic drug level monitoring; Chronic knee pain  (Location of Primary Source of Pain) (Bilateral) (L>R); Status post bariatric surgery; Morbid obesity (HCC); Osteoarthrosis; Substance Use Disorder Risk:; Cataract, nuclear; Allergic rhinitis; Age-related macular degeneration; Common Migraine (Without Aura); Cornea guttata; Clinical depression; Bariatric surgery status; HLD (hyperlipidemia); Hypertension; Arthritis of knee, degenerative (Bilateral); Gastroduodenal ulcer (Peptic Ulcer); Cataract, post subcapsular polar senile; Opioid use agreement exists; Myofascial pain; Muscle spasms of lower extremity; GERD (gastroesophageal reflux disease); Encounter for chronic pain management; Osteoarthritis of knee (Bilateral) (L>R); Vitamin D insufficiency; and Elevated C-reactive protein (CRP) on her problem list.. Her primarily concern today is the Knee Pain   Pain Assessment: Self-Reported Pain Score: 7 , clinically she looks like a 2-3/10. Reported level is inconsistent with clinical obrservations Pain Type: Chronic pain Pain Location: Knee Pain Orientation: Left Pain Descriptors / Indicators: Aching, Constant,  Sharp Pain Frequency: Constant  The patient comes into the clinics today for pharmacological management of her chronic pain.  Date of Last Visit: 11/22/15 Service Provided on Last Visit: Med Refill  Controlled Substance Pharmacotherapy Assessment & REMS (Risk Evaluation and Mitigation Strategy)  Analgesic: No change since the last visit (see below). Pill Count: Pill count 0/90 date filled was 01/22/2016 hydrocodone /apap 7.5-325mg  MME/day: No change since the last visit (see below). Date of Last Visit: 11/22/15 Pharmacokinetics: Onset of action (Liberation/Absorption): Within expected pharmacological parameters Time to Peak effect (Distribution): Timing and results are as within normal expected parameters Duration of action (Metabolism/Excretion): Within normal limits for medication Pharmacodynamics: Analgesic Effect: More than 50% Activity Facilitation: Medication(s) allow patient to sit, stand, walk, and do the basic ADLs Perceived Effectiveness: Described as relatively effective, allowing for increase in activities of daily living (ADL) Side-effects or Adverse reactions: None reported Monitoring: [place] PMP: Online review of the past 31-month period conducted. Compliant with practice rules and regulations UDS Results/interpretation: Last UDS done on 11/22/2015 came back within normal limits with no unexpected results. Medication Assessment Form: Reviewed. Patient indicates being compliant with therapy Treatment compliance: Compliant Risk Assessment: Aberrant Behavior: None observed today Substance Use Disorder (SUD) Risk Level: No change since last visit Risk of opioid abuse or dependence: 0.7-3.0% with doses ? 36 MME/day and 6.1-26% with doses ? 120 MME/day. Opioid Risk Tool (ORT) Score: Total Score: 1 Low Risk for SUD (Score <3) Depression Scale Score: PHQ-2: PHQ-2 Total Score: 0 No depression (0) PHQ-9: PHQ-9 Total Score: 0 No depression (0-4)  Pharmacologic Plan: No change in  therapy, at this time  Laboratory Chemistry  Inflammation Markers Lab Results  Component Value Date   ESRSEDRATE 41* 11/22/2015   CRP 1.6* 11/22/2015    Renal Function Lab Results  Component Value Date   BUN 12 11/22/2015   CREATININE 0.92 11/22/2015   GFRAA >  60 11/22/2015   GFRNONAA >60 11/22/2015    Hepatic Function Lab Results  Component Value Date   AST 23 11/22/2015   ALT 20 11/22/2015   ALBUMIN 4.3 11/22/2015    Electrolytes Lab Results  Component Value Date   NA 141 11/22/2015   K 3.7 11/22/2015   CL 102 11/22/2015   CALCIUM 9.6 11/22/2015   MG 2.2 11/22/2015   Note: I personally reviewed the above data. Results shared with patient.  Meds  The patient has a current medication list which includes the following prescription(s): amlodipine, atenolol, calcium carbonate, vitamin d, cyanocobalamin, hydrocodone-acetaminophen, losartan-hydrochlorothiazide, magnesium, megared omega-3 krill oil, multivitamin, nortriptyline, omeprazole, sertraline, magnesium (amino acid chelate), sumatriptan, vitamin b-12, hydrocodone-acetaminophen, and hydrocodone-acetaminophen.  Current Outpatient Prescriptions on File Prior to Visit  Medication Sig  . amLODipine (NORVASC) 5 MG tablet Take 5 mg by mouth daily.  Marland Kitchen. atenolol (TENORMIN) 25 MG tablet Take 25 mg by mouth daily.  . calcium carbonate (OS-CAL) 600 MG TABS tablet Take 600 mg by mouth.  . Cholecalciferol (VITAMIN D) 2000 UNITS tablet Take 2,000 Units by mouth daily.  . cyanocobalamin (TH VITAMIN B12) 100 MCG tablet Take by mouth.  . losartan-hydrochlorothiazide (HYZAAR) 100-25 MG tablet Take 1 tablet by mouth daily.  . Magnesium 100 MG TABS Take 1 tablet by mouth daily.  Marland Kitchen. MEGARED OMEGA-3 KRILL OIL 500 MG CAPS Take 1 capsule by mouth.  . Multiple Vitamin (MULTIVITAMIN) capsule Take 1 capsule by mouth daily.  . nortriptyline (PAMELOR) 25 MG capsule Take 25 mg by mouth at bedtime. Take 1-2 at bedtime  . omeprazole (PRILOSEC OTC)  20 MG tablet Take 20 mg by mouth.  . sertraline (ZOLOFT) 100 MG tablet Take 150 mg by mouth daily.  Marland Kitchen. Specialty Vitamins Products (MAGNESIUM, AMINO ACID CHELATE,) 133 MG tablet Take 1 tablet by mouth.   No current facility-administered medications on file prior to visit.    ROS  Constitutional: Afebrile, no chills, well hydrated and well nourished Gastrointestinal: No upper or lower GI bleeding, no nausea, no vomiting and no acute GI distress Musculoskeletal: No acute joint swelling or redness, no acute loss of range of motion and no acute onset weakness Neurological: Denies any acute onset apraxia, no episodes of paralysis, no acute loss of coordination, no acute loss of consciousness and no acute onset aphasia, dysarthria, agnosia, or amnesia  Allergies  Ms. Fredric MareBailey is allergic to diphenhydramine; ibuprofen; morphine and related; nsaids; tetanus toxoids; 2,4-d dimethylamine (amisol); diclofenac; diphenhydramine hcl; morphine; and naproxen.  PFSH  Medical:  Ms. Fredric MareBailey  has a past medical history of Osteoarthritis; Cataract; Depression; Hypertension; Migraines; Status post bariatric surgery (08/30/2015); History of migraine (08/30/2015); Gonalgia (05/30/2011); H/O cataract extraction (01/20/2015); Arthritis (07/27/2007); Acid reflux (10/27/2007); and Avitaminosis D (10/12/2012). Family: family history includes Cancer in her mother; Heart disease in her father. Surgical:  has past surgical history that includes Abdominal hysterectomy; Bariatric Surgery; Wrist surgery; Knee surgery; and Tonsillectomy. Tobacco:  reports that she has never smoked. She does not have any smokeless tobacco history on file. Alcohol:  reports that she does not drink alcohol. Drug:  reports that she does not use illicit drugs.  Physical Examination  Constitutional Vitals: Blood pressure 154/80, pulse 69, temperature 98.7 F (37.1 C), temperature source Oral, resp. rate 16, height 5\' 6"  (1.676 m), weight 240 lb (108.863  kg), SpO2 100 %. Calculated BMI: Body mass index is 38.76 kg/(m^2). (35-39.9 kg/m2) Severe obesity (Class II) - 136% higher incidence of chronic pain General appearance: Alert,  oriented x 3, in no apparent acute distress. Well nourished, well hydrated Eyes: PERLA Respiratory: No evidence respiratory distress, no audible rales or ronchi and no use of accessory muscles of respiration Psych: Alert, oriented to person, oriented to place and oriented to time  Cervical Spine Exam  Inspection: Normal anatomy, no anomalies observed Cervical Lordosis: Normal Alignment: Symetrical Functional ROM: Within functional limits [facility]) AROM: WFL Sensory: No sensory anomalies reported or detected  Upper Extremity Exam    Right  Left  Inspection: No gross anomalies detected  Inspection: No gross anomalies detected  Functional ROM: Adequate  Functional ROM: Adequate  AROM: Adequate  AROM: Adequate  Sensory: No sensory anomalies reported or detected  Sensory: No sensory anomalies reported or detected  Motor: Unremarkable  Motor: Unremarkable  Vascular: Normal skin color, temperature, and hair growth. No peripheral edema or cyanosis  Vascular: Normal skin color, temperature, and hair growth. No peripheral edema or cyanosis   Thoracic Spine  Inspection: No gross anomalies detected Alignment: Symetrical Functional ROM: Within functional limits [facility]) AROM: Adequate Palpation: WNL  Lumbar Spine  Inspection: No gross anomalies detected Alignment: Symetrical Functional ROM: Within functional limits (WFL) AROM: Adequate Sensory: No sensory anomalies reported or detected Palpation: WNL Provocative Tests: Lumbar Hyperextension and rotation test: deferred Patrick's Maneuver: deferred  Gait Assessment  Gait: Antalgic (limping)  Lower Extremities    Right  Left  Inspection: No gross anomalies detected  Inspection: No gross anomalies detected  Functional ROM: Within functional limits [facility])  Functional  ROM: Within functional limits (WFL)  AROM: Decreased for knee   AROM: Decreased for knee   Sensory: No sensory anomalies reported or detected  Sensory: No sensory anomalies reported or detected  Motor: Unremarkable  Motor: Unremarkable  Toe walk (S1): WNL  Toe walk (S1): WNL  Heal walk (L5): WNL  Heal walk (L5): WNL   Assessment & Plan  Primary Diagnosis & Pertinent Problem List: The primary encounter diagnosis was Chronic pain. Diagnoses of Encounter for therapeutic drug level monitoring, Long term current use of opiate analgesic, Opiate use, Muscle spasms of lower extremity, unspecified laterality, and Myofascial pain were also pertinent to this visit.  Visit Diagnosis: 1. Chronic pain   2. Encounter for therapeutic drug level monitoring   3. Long term current use of opiate analgesic   4. Opiate use   5. Muscle spasms of lower extremity, unspecified laterality   6. Myofascial pain     Problems updated and reviewed during this visit: No problems updated.  Problem-specific Plan(s): No problem-specific assessment & plan notes found for this encounter.  No new assessment & plan notes have been filed under this hospital service since the last note was generated. Service: Pain Management   Plan of Care   Problem List Items Addressed This Visit      High   Chronic pain - Primary (Chronic)   Relevant Medications   HYDROcodone-acetaminophen (NORCO) 7.5-325 MG tablet   HYDROcodone-acetaminophen (NORCO) 7.5-325 MG tablet   HYDROcodone-acetaminophen (NORCO) 7.5-325 MG tablet   Myofascial pain (Chronic)     Medium   Encounter for therapeutic drug level monitoring   Long term current use of opiate analgesic (Chronic)   Relevant Orders   ToxASSURE Select 13 (MW), Urine   Opiate use (Chronic)     Low   Muscle spasms of lower extremity (Chronic)       Pharmacotherapy (Medications Ordered): Meds ordered this encounter  Medications  . HYDROcodone-acetaminophen (NORCO) 7.5-325  MG tablet  Sig: Take 1 tablet by mouth every 8 (eight) hours as needed for moderate pain or severe pain.    Dispense:  90 tablet    Refill:  0    Do not place this medication, or any other prescription from our practice, on "Automatic Refill". Patient may have prescription filled one day early if pharmacy is closed on scheduled refill date. Do not fill until: 02/23/16 To last until: 03/24/16  . HYDROcodone-acetaminophen (NORCO) 7.5-325 MG tablet    Sig: Take 1 tablet by mouth every 8 (eight) hours as needed for moderate pain or severe pain.    Dispense:  90 tablet    Refill:  0    Do not place this medication, or any other prescription from our practice, on "Automatic Refill". Patient may have prescription filled one day early if pharmacy is closed on scheduled refill date. Do not fill until: 03/24/16 To last until: 04/23/16  . HYDROcodone-acetaminophen (NORCO) 7.5-325 MG tablet    Sig: Take 1 tablet by mouth every 8 (eight) hours as needed for moderate pain or severe pain.    Dispense:  90 tablet    Refill:  0    Do not place this medication, or any other prescription from our practice, on "Automatic Refill". Patient may have prescription filled one day early if pharmacy is closed on scheduled refill date. Do not fill until: 04/23/16 To last until: 05/23/16  . DISCONTD: tiZANidine (ZANAFLEX) 4 MG capsule    Sig: Take 1 capsule (4 mg total) by mouth daily.    Dispense:  30 capsule    Refill:  2    Do not add this medication to the electronic "Automatic Refill" notification system. Patient may have prescription filled one day early if pharmacy is closed on scheduled refill date.    Lab-work & Procedure Ordered: Orders Placed This Encounter  Procedures  . ToxASSURE Select 13 (MW), Urine    Imaging Ordered: None  Interventional Therapies: Scheduled:  None at this time.    Considering:  Bilateral intra-articular knee injections with Hyalgan versus genicular nerve blocks followed  by radiofrequency.    PRN Procedures:  Bilateral intra-articular knee injection with steroids versus Hyalgan.    Referral(s) or Consult(s): None at this time.  New Prescriptions   No medications on file    Medications administered during this visit: Ms. Fanton had no medications administered during this visit.  Future Appointments Date Time Provider Department Center  05/20/2016 8:00 AM Delano Metz, MD [facility] None    Primary Care Physician: Michelene Gardener, MD Location: [facility] Note by: Sydnee Levans Laban Emperor, M.D, DABA, DABAPM, DABPM, DABIPP, FIPP  Pain Score Disclaimer: We use the NRS-11 scale. This is a self-reported, subjective measurement of pain severity with only modest accuracy. It is used primarily to identify changes within a particular patient. It must be understood that outpatient pain scales are significantly less accurate that those used for research, where they can be applied under ideal controlled circumstances with minimal exposure to variables. In reality, the score is likely to be a combination of pain intensity and pain affect, where pain affect describes the degree of emotional arousal or changes in action readiness caused by the sensory experience of pain. Factors such as social and work situation, setting, emotional state, anxiety levels, expectation, and prior pain experience may influence pain perception and show large inter-individual differences that may also be affected by time variables.

## 2016-03-01 LAB — TOXASSURE SELECT 13 (MW), URINE: PDF: 0

## 2016-05-20 ENCOUNTER — Encounter: Payer: BC Managed Care – PPO | Admitting: Pain Medicine

## 2016-05-23 ENCOUNTER — Ambulatory Visit: Payer: BC Managed Care – PPO | Attending: Pain Medicine | Admitting: Pain Medicine

## 2016-05-23 ENCOUNTER — Encounter: Payer: Self-pay | Admitting: Pain Medicine

## 2016-05-23 ENCOUNTER — Other Ambulatory Visit
Admission: RE | Admit: 2016-05-23 | Discharge: 2016-05-23 | Disposition: A | Discharge status: Home / Self Care | Payer: BC Managed Care – PPO | Source: Ambulatory Visit | Attending: Pain Medicine | Admitting: Pain Medicine

## 2016-05-23 VITALS — BP 141/86 | HR 71 | Temp 97.8°F | Resp 16 | Ht 66.0 in | Wt 235.0 lb

## 2016-05-23 DIAGNOSIS — Z79891 Long term (current) use of opiate analgesic: Secondary | ICD-10-CM

## 2016-05-23 DIAGNOSIS — H353190 Nonexudative age-related macular degeneration, unspecified eye, stage unspecified: Secondary | ICD-10-CM | POA: Insufficient documentation

## 2016-05-23 DIAGNOSIS — K279 Peptic ulcer, site unspecified, unspecified as acute or chronic, without hemorrhage or perforation: Secondary | ICD-10-CM | POA: Diagnosis not present

## 2016-05-23 DIAGNOSIS — G8929 Other chronic pain: Secondary | ICD-10-CM | POA: Diagnosis not present

## 2016-05-23 DIAGNOSIS — M62838 Other muscle spasm: Secondary | ICD-10-CM | POA: Diagnosis not present

## 2016-05-23 DIAGNOSIS — E785 Hyperlipidemia, unspecified: Secondary | ICD-10-CM | POA: Diagnosis not present

## 2016-05-23 DIAGNOSIS — Z5181 Encounter for therapeutic drug level monitoring: Secondary | ICD-10-CM

## 2016-05-23 DIAGNOSIS — M25561 Pain in right knee: Secondary | ICD-10-CM | POA: Diagnosis not present

## 2016-05-23 DIAGNOSIS — I10 Essential (primary) hypertension: Secondary | ICD-10-CM | POA: Diagnosis not present

## 2016-05-23 DIAGNOSIS — R7982 Elevated C-reactive protein (CRP): Secondary | ICD-10-CM | POA: Diagnosis not present

## 2016-05-23 DIAGNOSIS — M25562 Pain in left knee: Secondary | ICD-10-CM | POA: Insufficient documentation

## 2016-05-23 DIAGNOSIS — K219 Gastro-esophageal reflux disease without esophagitis: Secondary | ICD-10-CM | POA: Diagnosis not present

## 2016-05-23 DIAGNOSIS — Z6837 Body mass index (BMI) 37.0-37.9, adult: Secondary | ICD-10-CM | POA: Diagnosis not present

## 2016-05-23 DIAGNOSIS — E559 Vitamin D deficiency, unspecified: Secondary | ICD-10-CM | POA: Insufficient documentation

## 2016-05-23 DIAGNOSIS — M170 Bilateral primary osteoarthritis of knee: Secondary | ICD-10-CM | POA: Insufficient documentation

## 2016-05-23 DIAGNOSIS — M25569 Pain in unspecified knee: Secondary | ICD-10-CM | POA: Diagnosis present

## 2016-05-23 DIAGNOSIS — J309 Allergic rhinitis, unspecified: Secondary | ICD-10-CM | POA: Insufficient documentation

## 2016-05-23 DIAGNOSIS — G43009 Migraine without aura, not intractable, without status migrainosus: Secondary | ICD-10-CM | POA: Insufficient documentation

## 2016-05-23 DIAGNOSIS — H2510 Age-related nuclear cataract, unspecified eye: Secondary | ICD-10-CM | POA: Diagnosis not present

## 2016-05-23 DIAGNOSIS — Z9884 Bariatric surgery status: Secondary | ICD-10-CM | POA: Diagnosis not present

## 2016-05-23 DIAGNOSIS — R51 Headache: Secondary | ICD-10-CM | POA: Diagnosis present

## 2016-05-23 LAB — SEDIMENTATION RATE: Sed Rate: 43 mm/hr — ABNORMAL HIGH (ref 0–30)

## 2016-05-23 LAB — COMPREHENSIVE METABOLIC PANEL
ALBUMIN: 4.1 g/dL (ref 3.5–5.0)
ALT: 18 U/L (ref 14–54)
ANION GAP: 7 (ref 5–15)
AST: 24 U/L (ref 15–41)
Alkaline Phosphatase: 124 U/L (ref 38–126)
BUN: 15 mg/dL (ref 6–20)
CO2: 29 mmol/L (ref 22–32)
Calcium: 9.4 mg/dL (ref 8.9–10.3)
Chloride: 105 mmol/L (ref 101–111)
Creatinine, Ser: 0.97 mg/dL (ref 0.44–1.00)
GFR calc Af Amer: >60 mL/min (ref >60)
GFR calc non Af Amer: >60 mL/min (ref >60)
GLUCOSE: 95 mg/dL (ref 65–99)
POTASSIUM: 4.1 mmol/L (ref 3.5–5.1)
SODIUM: 141 mmol/L (ref 135–145)
TOTAL PROTEIN: 7.2 g/dL (ref 6.5–8.1)
Total Bilirubin: 0.4 mg/dL (ref 0.3–1.2)

## 2016-05-23 LAB — MAGNESIUM: MAGNESIUM: 2.2 mg/dL (ref 1.7–2.4)

## 2016-05-23 LAB — VITAMIN B12: VITAMIN B 12: 1285 pg/mL — AB (ref 180–914)

## 2016-05-23 LAB — C-REACTIVE PROTEIN: CRP: 1.2 mg/dL — ABNORMAL HIGH (ref <1.0)

## 2016-05-23 MED ORDER — HYDROCODONE-ACETAMINOPHEN 7.5-325 MG PO TABS: 1.0000 | ORAL_TABLET | Freq: Three times a day (TID) | ORAL | Status: DC | PRN

## 2016-05-23 NOTE — Progress Notes (Signed)
Safety precautions to be maintained throughout the outpatient stay will include: orient to surroundings, keep bed in low position, maintain call bell within reach at all times, provide assistance with transfer out of bed and ambulation.  Pills remaining hydrocodne 7.5-325 0/90  Filled 04/23/16

## 2016-05-23 NOTE — Progress Notes (Signed)
Patient's Name: Sophia Jackson  Patient type: Established  MRN: [id number]  Service setting: Ambulatory outpatient  DOB: [birth year]  Location: [facility]  DOS: 05/23/2016  Primary Care Physician: Michelene Gardener, MD  Note by: Sydnee Levans Laban Emperor, M.D, DABA, DABAPM, DABPM, DABIPP, FIPP  Referring Physician: Michelene Gardener, MD  Specialty: Board-Certified Interventional Pain Management  Last Visit to Pain Management: 02/23/2016   Primary Reason(s) for Visit: Encounter for prescription drug management (Level of risk: moderate) CC: Knee Pain and Headache   HPI  Ms. Mcphee is a 55 y.o. year old, female patient, who returns today as an established patient. She has Chronic pain; Opiate use; Opiate dependence (HCC); Long term prescription opiate use; Long term current use of opiate analgesic; Encounter for therapeutic drug level monitoring; Chronic knee pain  (Location of Primary Source of Pain) (Bilateral) (L>R); Status post bariatric surgery; Morbid obesity (HCC); Osteoarthrosis; Substance Use Disorder Risk:; Cataract, nuclear; Allergic rhinitis; Age-related macular degeneration; Common Migraine (Without Aura); Cornea guttata; Clinical depression; Bariatric surgery status; HLD (hyperlipidemia); Hypertension; Arthritis of knee, degenerative (Bilateral); Gastroduodenal ulcer (Peptic Ulcer); Cataract, post subcapsular polar senile; Opioid use agreement exists; Myofascial pain; Muscle spasms of lower extremity; GERD (gastroesophageal reflux disease); Encounter for chronic pain management; Osteoarthritis of knee (Bilateral) (L>R); Vitamin D insufficiency; Elevated C-reactive protein (CRP); and Age-related nuclear cataract of both eyes on her problem list.. Her primarily concern today is the Knee Pain and Headache   Pain Assessment: Self-Reported Pain Score: 3              Reported level is compatible with observation       Pain Type: Chronic pain Pain Location: Knee Pain  Orientation: Right, Left Pain Descriptors / Indicators: Sharp, Aching Pain Frequency: Constant  The patient comes into the clinics today for pharmacological management of her chronic pain. I last saw this patient on 02/23/2016. The patient  reports that she does not use illicit drugs. Her body mass index is 37.95 kg/(m^2).  Date of Last Visit: 02/23/16 Service Provided on Last Visit: Med Refill  Controlled Substance Pharmacotherapy Assessment & REMS (Risk Evaluation and Mitigation Strategy)  Analgesic: Hydrocodone/APAP 7.5/325 one every 8 hours (22.5 mg/day of hydrocodone) MME/day: 22.5 mg/day.  Pill Count: Pills remaining hydrocodne 7.5-325 0/90 Filled 04/23/16 Pharmacokinetics: Onset of action (Liberation/Absorption): Within expected pharmacological parameters Time to Peak effect (Distribution): Timing and results are as within normal expected parameters Duration of action (Metabolism/Excretion): Within normal limits for medication Pharmacodynamics: Analgesic Effect: More than 50% Activity Facilitation: Medication(s) allow patient to sit, stand, walk, and do the basic ADLs Perceived Effectiveness: Described as relatively effective, allowing for increase in activities of daily living (ADL) Side-effects or Adverse reactions: None reported Monitoring: [place] PMP: Online review of the past 28-month period conducted. Compliant with practice rules and regulations Last UDS on record: TOXASSURE SELECT 13  Date Value Ref Range Status  02/23/2016 FINAL  Final    Comment:    ==================================================================== TOXASSURE SELECT 13 (MW) ==================================================================== Test                             Result       Flag       Units Drug Present and Declared for Prescription Verification   Hydrocodone                    294          EXPECTED  ng/mg creat   Hydromorphone                  80           EXPECTED   ng/mg creat    Dihydrocodeine                 177          EXPECTED   ng/mg creat   Norhydrocodone                 899          EXPECTED   ng/mg creat    Sources of hydrocodone include scheduled prescription    medications. Hydromorphone, dihydrocodeine and norhydrocodone are    expected metabolites of hydrocodone. Hydromorphone and    dihydrocodeine are also available as scheduled prescription    medications. ==================================================================== Test                      Result    Flag   Units      Ref Range   Creatinine              111              mg/dL      >=16>=20 ==================================================================== Declared Medications:  The flagging and interpretation on this report are based on the  following declared medications.  Unexpected results may arise from  inaccuracies in the declared medications.  **Note: The testing scope of this panel includes these medications:  Hydrocodone (Norco)  **Note: The testing scope of this panel does not include following  reported medications:  Acetaminophen (Norco)  Amlodipine (Norvasc)  Atenolol (Tenormin)  Cyanocobalamin  Hydrochlorothiazide (HCTZ)  Losartan (Losartan Potassium)  Magnesium  Multivitamin  Nortriptyline (Pamelor)  Omeprazole  Sertraline (Zoloft)  Sumatriptan (Imitrex)  Supplement (Krill Oil)  Supplement (OsCal)  Tizanidine (Zanaflex)  Vitamin D ==================================================================== For clinical consultation, please call [phone]. ====================================================================    UDS interpretation: Compliant          Medication Assessment Form: Reviewed. Patient indicates being compliant with therapy Treatment compliance: Compliant Risk Assessment: Aberrant Behavior: None observed today Substance Use Disorder (SUD) Risk Level: Low-to-moderate Risk of opioid abuse or dependence: 0.7-3.0% with doses ? 36 MME/day and  6.1-26% with doses ? 120 MME/day. Opioid Risk Tool (ORT) Score: Total Score: 1 Low Risk for SUD (Score <3) Depression Scale Score: PHQ-2: PHQ-2 Total Score: 0 No depression (0) PHQ-9: PHQ-9 Total Score: 0 No depression (0-4)  Pharmacologic Plan: No change in therapy, at this time  Laboratory Chemistry  Inflammation Markers Lab Results  Component Value Date   ESRSEDRATE 43* 05/23/2016   CRP 1.2* 05/23/2016    Renal Function Lab Results  Component Value Date   BUN 15 05/23/2016   CREATININE 0.97 05/23/2016   GFRAA >60 05/23/2016   GFRNONAA >60 05/23/2016    Hepatic Function Lab Results  Component Value Date   AST 24 05/23/2016   ALT 18 05/23/2016   ALBUMIN 4.1 05/23/2016    Electrolytes Lab Results  Component Value Date   NA 141 05/23/2016   K 4.1 05/23/2016   CL 105 05/23/2016   CALCIUM 9.4 05/23/2016   MG 2.2 05/23/2016    Pain Modulating Vitamins Lab Results  Component Value Date   VITAMINB12 1285* 05/23/2016    Coagulation Parameters No results found for: INR, LABPROT, APTT, PLT  Cardiovascular No results found for: BNP, HGB, HCT  Note: Lab results  reviewed.  Recent Diagnostic Imaging  Dg Knee 1-2 Views Left  12/01/2015  CLINICAL DATA:  Bilateral chronic left knee pain EXAM: LEFT KNEE - 1-2 VIEW COMPARISON:  11/24/2014 and 11/25/2013 FINDINGS: Three views of the left knee submitted. No acute fracture or subluxation. Previous fracture of the lateral tibial plateau is healed. No joint effusion. Mild narrowing of patellofemoral joint space. Mild spurring of patella. There is mild narrowing of medial joint compartment. Spurring of lateral femoral condyle. IMPRESSION: No acute fracture or subluxation. Mild degenerative changes as described above. Electronically Signed   By: Natasha Mead M.D.   On: 12/01/2015 13:34   Dg Knee 1-2 Views Right  12/01/2015  CLINICAL DATA:  Five years of knee pain which has gradually grown worse ; history of tibial plateau fracture  on the left. EXAM: RIGHT KNEE - 1-2 VIEW COMPARISON:  Right knee series of November 24, 2014 FINDINGS: The bones of the right knee are adequately mineralized. The tibial plateaus appear intact. There is mild narrowing of the lateral joint compartment which is stable. There are small are marginal osteophytes arising from the periphery of the lateral femoral condyle and lateral tibial plateau. There is no chondrocalcinosis or joint effusion. There are small osteophytes arising from the superior and inferior articular margins of the patella. There is no acute fracture nor dislocation. IMPRESSION: There is mild to moderate osteoarthritic change centered on the lateral and patellofemoral compartments. There is no tibial plateau fracture nor other acute bony abnormality of the right knee. Electronically Signed   By: David  [place] M.D.   On: 12/01/2015 13:31   Knee Imaging: Knee-R DG 1-2 views:  Results for orders placed during the hospital encounter of 12/01/15  DG Knee 1-2 Views Right   Narrative CLINICAL DATA:  Five years of knee pain which has gradually grown worse ; history of tibial plateau fracture on the left.  EXAM: RIGHT KNEE - 1-2 VIEW  COMPARISON:  Right knee series of November 24, 2014  FINDINGS: The bones of the right knee are adequately mineralized. The tibial plateaus appear intact. There is mild narrowing of the lateral joint compartment which is stable. There are small are marginal osteophytes arising from the periphery of the lateral femoral condyle and lateral tibial plateau. There is no chondrocalcinosis or joint effusion. There are small osteophytes arising from the superior and inferior articular margins of the patella. There is no acute fracture nor dislocation.  IMPRESSION: There is mild to moderate osteoarthritic change centered on the lateral and patellofemoral compartments. There is no tibial plateau fracture nor other acute bony abnormality of the right  knee.   Electronically Signed   By: David  [place] M.D.   On: 12/01/2015 13:31    Knee-L DG 1-2 views:  Results for orders placed during the hospital encounter of 12/01/15  DG Knee 1-2 Views Left   Narrative CLINICAL DATA:  Bilateral chronic left knee pain  EXAM: LEFT KNEE - 1-2 VIEW  COMPARISON:  11/24/2014 and 11/25/2013  FINDINGS: Three views of the left knee submitted. No acute fracture or subluxation. Previous fracture of the lateral tibial plateau is healed. No joint effusion. Mild narrowing of patellofemoral joint space. Mild spurring of patella. There is mild narrowing of medial joint compartment. Spurring of lateral femoral condyle.  IMPRESSION: No acute fracture or subluxation. Mild degenerative changes as described above.   Electronically Signed   By: Natasha Mead M.D.   On: 12/01/2015 13:34    Knee-R DG 4 views:  Results  for orders placed in visit on 11/24/14  DG Knee Complete 4 Views Right   Narrative * PRIOR REPORT IMPORTED FROM AN EXTERNAL SYSTEM *   CLINICAL DATA:  55 year old female status post fall over concrete  parking bumper earlier today.   EXAM:  RIGHT KNEE - COMPLETE 4+ VIEW   COMPARISON:  Concurrently obtained radiographs of the left knee   FINDINGS:  No acute fracture, malalignment or knee joint effusion.  Tricompartmental degenerative osteoarthritis of moderate severity  with compartmental joint space narrowing and osteophyte formation.  Normal bony mineralization. No lytic or blastic osseous lesion.  Narrowing   IMPRESSION:  1. No acute fracture, malalignment or joint effusion.  2. Tricompartmental osteoarthritis of moderate severity.    Electronically Signed    By: Malachy Moan M.D.    On: 11/24/2014 19:12       Note: Imaging results reviewed.  Meds  The patient has a current medication list which includes the following prescription(s): amlodipine, atenolol, calcium carbonate, vitamin d, cyanocobalamin, gabapentin,  hydrocodone-acetaminophen, hydrocodone-acetaminophen, hydrocodone-acetaminophen, losartan-hydrochlorothiazide, magnesium, megared omega-3 krill oil, methocarbamol, multivitamin, nortriptyline, omeprazole, sertraline, magnesium (amino acid chelate), sumatriptan, and vitamin b-12.  Current Outpatient Prescriptions on File Prior to Visit  Medication Sig  . amLODipine (NORVASC) 5 MG tablet Take 5 mg by mouth daily.  Marland Kitchen atenolol (TENORMIN) 25 MG tablet Take 25 mg by mouth daily.  . calcium carbonate (OS-CAL) 600 MG TABS tablet Take 600 mg by mouth.  . Cholecalciferol (VITAMIN D) 2000 UNITS tablet Take 2,000 Units by mouth daily.  . cyanocobalamin (TH VITAMIN B12) 100 MCG tablet Take by mouth.  . losartan-hydrochlorothiazide (HYZAAR) 100-25 MG tablet Take 1 tablet by mouth daily.  . Magnesium 100 MG TABS Take 1 tablet by mouth daily.  Marland Kitchen MEGARED OMEGA-3 KRILL OIL 500 MG CAPS Take 1 capsule by mouth.  . Multiple Vitamin (MULTIVITAMIN) capsule Take 1 capsule by mouth daily.  . nortriptyline (PAMELOR) 25 MG capsule Take 25 mg by mouth at bedtime. Take 1-2 at bedtime  . omeprazole (PRILOSEC OTC) 20 MG tablet Take 20 mg by mouth.  . sertraline (ZOLOFT) 100 MG tablet Take 150 mg by mouth daily.  Marland Kitchen Specialty Vitamins Products (MAGNESIUM, AMINO ACID CHELATE,) 133 MG tablet Take 1 tablet by mouth.  . SUMAtriptan (IMITREX) 50 MG tablet Take 50 mg by mouth as needed.   . vitamin B-12 (CYANOCOBALAMIN) 100 MCG tablet Take 100 mcg by mouth daily.    No current facility-administered medications on file prior to visit.    ROS  Constitutional: Denies any fever or chills Gastrointestinal: No reported hemesis, hematochezia, vomiting, or acute GI distress Musculoskeletal: Denies any acute onset joint swelling, redness, loss of ROM, or weakness Neurological: No reported episodes of acute onset apraxia, aphasia, dysarthria, agnosia, amnesia, paralysis, loss of coordination, or loss of consciousness  Allergies  Ms.  Myung is allergic to diphenhydramine; ibuprofen; morphine and related; nsaids; tetanus toxoids; 2,4-d dimethylamine (amisol); diclofenac; diphenhydramine hcl; morphine; and naproxen.  PFSH  Medical:  Ms. Varone  has a past medical history of Osteoarthritis; Cataract; Depression; Hypertension; Migraines; Status post bariatric surgery (08/30/2015); History of migraine (08/30/2015); Gonalgia (05/30/2011); H/O cataract extraction (01/20/2015); Arthritis (07/27/2007); Acid reflux (10/27/2007); and Avitaminosis D (10/12/2012). Family: family history includes Cancer in her mother; Heart disease in her father. Surgical:  has past surgical history that includes Abdominal hysterectomy; Bariatric Surgery; Wrist surgery; Knee surgery; and Tonsillectomy. Tobacco:  reports that she has never smoked. She does not have any smokeless tobacco history on  file. Alcohol:  reports that she does not drink alcohol. Drug:  reports that she does not use illicit drugs.  Constitutional Exam  Vitals: Blood pressure 141/86, pulse 71, temperature 97.8 F (36.6 C), temperature source Oral, resp. rate 16, height  (1.676 m), weight 235 lb (106.595 kg), SpO2 100 %. General appearance: Well nourished, well developed, and well hydrated. In no acute distress Calculated BMI/Body habitus: Body mass index is 37.95 kg/(m^2). (35-39.9 kg/m2) Severe obesity (Class II) - 136% higher incidence of chronic pain Psych/Mental status: Alert and oriented x 3 (person, place, & time) Eyes: PERLA Respiratory: No evidence of acute respiratory distress  Cervical Spine Exam  Inspection: No masses, redness, or swelling Alignment: Symmetrical ROM: Functional: ROM is within functional limits [facility]) Stability: No instability detected Muscle strength & Tone: Functionally intact Sensory: Unimpaired Palpation: No complaints of tenderness  Upper Extremity (UE) Exam    Side: Right upper extremity  Side: Left upper extremity  Inspection: No masses,  redness, swelling, or asymmetry  Inspection: No masses, redness, swelling, or asymmetry  ROM:  ROM:  Functional: ROM is within functional limits [facility])        Functional: ROM is within functional limits [facility])        Muscle strength & Tone: Functionally intact  Muscle strength & Tone: Functionally intact  Sensory: Unimpaired  Sensory: Unimpaired  Palpation: No complaints of tenderness  Palpation: No complaints of tenderness   Thoracic Spine Exam  Inspection: No masses, redness, or swelling Alignment: Symmetrical ROM: Functional: ROM is within functional limits [facility]) Stability: No instability detected Sensory: Unimpaired Muscle strength & Tone: Functionally intact Palpation: No complaints of tenderness  Lumbar Spine Exam  Inspection: No masses, redness, or swelling Alignment: Symmetrical ROM: Functional: ROM is within functional limits [facility]) Stability: No instability detected Muscle strength & Tone: Functionally intact Sensory: Unimpaired Palpation: No complaints of tenderness Provocative Tests: Lumbar Hyperextension and rotation test: evaluation deferred today       Patrick's Maneuver: evaluation deferred today              Gait & Posture Assessment  Ambulation: Unassisted Gait: Unaffected Posture: WNL   Lower Extremity Exam    Side: Right lower extremity  Side: Left lower extremity  Inspection: No masses, redness, swelling, or asymmetry ROM:  Inspection: No masses, redness, swelling, or asymmetry ROM:  Functional: Reduced ROM Due to knee arthropathy  Functional: Reduced ROM Due to knee arthropathy  Muscle strength & Tone: Functionally intact  Muscle strength & Tone: Functionally intact  Sensory: Unimpaired  Sensory: Unimpaired  Palpation: No complaints of tenderness  Palpation: No complaints of tenderness   Assessment & Plan  Primary Diagnosis & Pertinent Problem List: The primary encounter diagnosis was Chronic pain. Diagnoses of Encounter for therapeutic drug level  monitoring and Long term current use of opiate analgesic were also pertinent to this visit.  Visit Diagnosis: 1. Chronic pain   2. Encounter for therapeutic drug level monitoring   3. Long term current use of opiate analgesic     Problems updated and reviewed during this visit: Problem  Age-Related Nuclear Cataract of Both Eyes    Problem-specific Plan(s): No problem-specific assessment & plan notes found for this encounter.  No new assessment & plan notes have been filed under this hospital service since the last note was generated. Service: Pain Management   Plan of Care   Problem List Items Addressed This Visit      High   Chronic pain - Primary (Chronic)  Relevant Medications   gabapentin (NEURONTIN) 100 MG capsule   methocarbamol (ROBAXIN) 500 MG tablet   HYDROcodone-acetaminophen (NORCO) 7.5-325 MG tablet   HYDROcodone-acetaminophen (NORCO) 7.5-325 MG tablet   HYDROcodone-acetaminophen (NORCO) 7.5-325 MG tablet   Other Relevant Orders   Comprehensive metabolic panel (Completed)   Sedimentation rate (Completed)   C-reactive protein (Completed)   Magnesium (Completed)   25-Hydroxyvitamin D Lcms D2+D3   Vitamin B12 (Completed)     Medium   Encounter for therapeutic drug level monitoring   Long term current use of opiate analgesic (Chronic)       Pharmacotherapy (Medications Ordered): Meds ordered this encounter  Medications  . HYDROcodone-acetaminophen (NORCO) 7.5-325 MG tablet    Sig: Take 1 tablet by mouth every 8 (eight) hours as needed for severe pain.    Dispense:  90 tablet    Refill:  0    Do not place this medication, or any other prescription from our practice, on "Automatic Refill". Patient may have prescription filled one day early if pharmacy is closed on scheduled refill date. Do not fill until: 05/23/16 To last until: 06/22/16  . HYDROcodone-acetaminophen (NORCO) 7.5-325 MG tablet    Sig: Take 1 tablet by mouth every 8 (eight) hours as needed  for severe pain.    Dispense:  90 tablet    Refill:  0    Do not place this medication, or any other prescription from our practice, on "Automatic Refill". Patient may have prescription filled one day early if pharmacy is closed on scheduled refill date. Do not fill until: 06/22/16 To last until: 07/22/16  . HYDROcodone-acetaminophen (NORCO) 7.5-325 MG tablet    Sig: Take 1 tablet by mouth every 8 (eight) hours as needed for severe pain.    Dispense:  90 tablet    Refill:  0    Do not place this medication, or any other prescription from our practice, on "Automatic Refill". Patient may have prescription filled one day early if pharmacy is closed on scheduled refill date. Do not fill until: 07/22/16 To last until: 08/21/16    [facility] & Procedure Ordered: Orders Placed This Encounter  Procedures  . Comprehensive metabolic panel  . Sedimentation rate  . C-reactive protein  . Magnesium  . 25-Hydroxyvitamin D Lcms D2+D3  . Vitamin B12    Imaging Ordered: None  Interventional Therapies: Scheduled: None at this time.    Considering: Bilateral intra-articular knee injections with Hyalgan versus genicular nerve blocks followed by radiofrequency.    PRN Procedures: Bilateral intra-articular knee injection with steroids versus Hyalgan.        Referral(s) or Consult(s): None at this time.  New Prescriptions   No medications on file    Medications administered during this visit: Ms. Lashomb had no medications administered during this visit.  Requested PM Follow-up: Return in 2 months (on 08/05/2016) for Med-Mgmt, (3-Mo).  Future Appointments Date Time Provider Department Center  08/05/2016 8:20 AM Delano Metz, MD [facility] None    Primary Care Physician: Michelene Gardener, MD Location: [facility] Note by: Sydnee Levans Laban Emperor, M.D, DABA, DABAPM, DABPM, DABIPP, FIPP  Pain Score Disclaimer: We use the NRS-11 scale. This is a  self-reported, subjective measurement of pain severity with only modest accuracy. It is used primarily to identify changes within a particular patient. It must be understood that outpatient pain scales are significantly less accurate that those used for research, where they can be applied under ideal controlled circumstances with minimal exposure to variables.  In reality, the score is likely to be a combination of pain intensity and pain affect, where pain affect describes the degree of emotional arousal or changes in action readiness caused by the sensory experience of pain. Factors such as social and work situation, setting, emotional state, anxiety levels, expectation, and prior pain experience may influence pain perception and show large inter-individual differences that may also be affected by time variables.  Patient instructions provided during this appointment: There are no Patient Instructions on file for this visit.

## 2016-05-26 LAB — 25-HYDROXYVITAMIN D LCMS D2+D3
25-HYDROXY, VITAMIN D-3: 36 ng/mL
25-HYDROXY, VITAMIN D: 36 ng/mL

## 2016-05-28 NOTE — Progress Notes (Signed)
-   Normal Vitamin B-12 levels are between 211 and 946 pg/mL, for our Lab. Medical conditions that can increase levels of vitamin B12 include liver disease, kidney failure and myeloproliferative disorders, which includes myelocytic leukemia and polycythemia vera. 

## 2016-05-28 NOTE — Progress Notes (Signed)
Normal levels of C-Reactive Protein for our Lab are less than 1.0 mg/L. C-reactive protein (CRP) is produced by the liver. The level of CRP rises when there is inflammation throughout the body. CRP goes up in response to inflammation. High levels suggests the presence of chronic inflammation but do not identify its location or cause. High levels have been observed in obese patients, individuals with bacterial infections, chronic inflammation, or flare-ups of inflammatory conditions. Drops of previously elevated levels suggest that the inflammation or infection is subsiding and/or responding to treatment.A normal sedimentation rate should be below 30 mm/hr. The sed rate is an acute phase reactant that indirectly measures the degree of inflammation present in the body. It can be acute, developing rapidly after trauma, injury or infection, for example, or can occur over an extended time (chronic) with conditions such as autoimmune diseases or cancer. The ESR is not diagnostic; it is a non-specific, screening test that may be elevated in a number of these different conditions. It provides general information about the presence or absence of an inflammatory condition.The combined elevation of the ESR & CRP, may be suggestive of an autoimmune disease. Should this be the case, we will inquire if the patient has had a rheumatologic evaluation looking at  the RF levels, ANA levels, and CBC.

## 2016-08-05 ENCOUNTER — Encounter: Payer: Self-pay | Admitting: Pain Medicine

## 2016-08-05 ENCOUNTER — Ambulatory Visit: Payer: BC Managed Care – PPO | Attending: Pain Medicine | Admitting: Pain Medicine

## 2016-08-05 VITALS — BP 133/75 | HR 71 | Temp 98.3°F | Resp 18 | Ht 66.0 in | Wt 235.0 lb

## 2016-08-05 DIAGNOSIS — Z9884 Bariatric surgery status: Secondary | ICD-10-CM | POA: Insufficient documentation

## 2016-08-05 DIAGNOSIS — M1990 Unspecified osteoarthritis, unspecified site: Secondary | ICD-10-CM | POA: Insufficient documentation

## 2016-08-05 DIAGNOSIS — M25562 Pain in left knee: Secondary | ICD-10-CM

## 2016-08-05 DIAGNOSIS — K219 Gastro-esophageal reflux disease without esophagitis: Secondary | ICD-10-CM | POA: Diagnosis not present

## 2016-08-05 DIAGNOSIS — F1190 Opioid use, unspecified, uncomplicated: Secondary | ICD-10-CM

## 2016-08-05 DIAGNOSIS — Z5181 Encounter for therapeutic drug level monitoring: Secondary | ICD-10-CM | POA: Diagnosis not present

## 2016-08-05 DIAGNOSIS — G894 Chronic pain syndrome: Secondary | ICD-10-CM | POA: Insufficient documentation

## 2016-08-05 DIAGNOSIS — I10 Essential (primary) hypertension: Secondary | ICD-10-CM | POA: Diagnosis not present

## 2016-08-05 DIAGNOSIS — E785 Hyperlipidemia, unspecified: Secondary | ICD-10-CM | POA: Insufficient documentation

## 2016-08-05 DIAGNOSIS — Z6837 Body mass index (BMI) 37.0-37.9, adult: Secondary | ICD-10-CM | POA: Insufficient documentation

## 2016-08-05 DIAGNOSIS — Z79891 Long term (current) use of opiate analgesic: Secondary | ICD-10-CM | POA: Diagnosis not present

## 2016-08-05 DIAGNOSIS — M25561 Pain in right knee: Secondary | ICD-10-CM

## 2016-08-05 DIAGNOSIS — G8929 Other chronic pain: Secondary | ICD-10-CM

## 2016-08-05 DIAGNOSIS — M170 Bilateral primary osteoarthritis of knee: Secondary | ICD-10-CM | POA: Diagnosis not present

## 2016-08-05 MED ORDER — HYDROCODONE-ACETAMINOPHEN 7.5-325 MG PO TABS: 1.0000 | ORAL_TABLET | Freq: Three times a day (TID) | ORAL | 0 refills | Status: DC | PRN

## 2016-08-05 NOTE — Progress Notes (Signed)
Safety precautions to be maintained throughout the outpatient stay will include: orient to surroundings, keep bed in low position, maintain call bell within reach at all times, provide assistance with transfer out of bed and ambulation.  Bottle labeled hydrocodone/apap 7.5/325 mg #55/90  Filled 07-24-16

## 2016-08-05 NOTE — Progress Notes (Signed)
Patient's Name: Sophia Jackson  MRN: [id number]  Referring Provider: Michelene Gardener, MD  DOB: [birth year]  PCP: Sophia Gardener, MD  DOS: 08/05/2016  Note by: Sophia Levans. Laban Emperor, MD  Service setting: Ambulatory outpatient  Specialty: Interventional Pain Management  Location: [facility] (AMB) Pain Management Facility    Patient type: Established   Primary Reason(s) for Visit: Encounter for prescription drug management (Level of risk: moderate) CC: Knee Pain (bilateral)  HPI  [name] is a 55 y.o. year old, female patient, who comes today for an initial evaluation. She has Opiate use; Long term prescription opiate use; Long term current use of opiate analgesic; Encounter for therapeutic drug level monitoring; Chronic knee pain  (Location of Primary Source of Pain) (Bilateral) (L>R); Status post bariatric surgery; Morbid obesity (HCC); Osteoarthrosis; Cataract, nuclear; Allergic rhinitis; Age-related macular degeneration; Common Migraine (Without Aura); Cornea guttata; Clinical depression; HLD (hyperlipidemia); Hypertension; Gastroduodenal ulcer (Peptic Ulcer); Cataract, post subcapsular polar senile; Opioid use agreement exists; Myofascial pain; Muscle spasms of lower extremity; GERD (gastroesophageal reflux disease); Encounter for chronic pain management; Osteoarthritis of knee (Bilateral) (L>R); Vitamin D insufficiency; Elevated C-reactive protein (CRP); Age-related nuclear cataract of both eyes; and Chronic pain on her problem list.. Her primarily concern today is the Knee Pain (bilateral)  Pain Assessment: Self-Reported Pain Score: 3 /10             Reported level is compatible with observation.       Pain Type: Chronic pain Pain Location: Knee Pain Orientation: Right, Left Pain Descriptors / Indicators: Sharp Pain Frequency: Constant  The patient comes into the clinics today for pharmacological management of her chronic pain. I last saw this patient on 05/23/2016. The patient  reports  that she does not use drugs. Her body mass index is 37.93 kg/m.  Date of Last Visit: 05/23/16 Service Provided on Last Visit: Med Refill  Controlled Substance Pharmacotherapy Assessment & REMS (Risk Evaluation and Mitigation Strategy)  Analgesic: Hydrocodone/APAP 7.5/325 one every 8 hours (22.5 mg/day of hydrocodone) MME/day: 22.5 mg/day.  Pill Count: Bottle labeled hydrocodone/apap 7.5/325 mg #55/90  Filled 07-24-16. Pharmacokinetics: Onset of action (Liberation/Absorption): Within expected pharmacological parameters Time to Peak effect (Distribution): Timing and results are as within normal expected parameters Duration of action (Metabolism/Excretion): Within normal limits for medication Pharmacodynamics: Analgesic Effect: More than 50% Activity Facilitation: Medication(s) allow patient to sit, stand, walk, and do the basic ADLs Perceived Effectiveness: Described as relatively effective, allowing for increase in activities of daily living (ADL) Side-effects or Adverse reactions: None reported Monitoring: [place] PMP: Online review of the past 38-month period conducted. Compliant with practice rules and regulations List of all UDS test(s) done:  Lab Results  Component Value Date   TOXASSSELUR FINAL 02/23/2016   TOXASSSELUR FINAL 11/22/2015   Last UDS on record: ToxAssure Select 13  Date Value Ref Range Status  02/23/2016 FINAL  Final    Comment:    ==================================================================== TOXASSURE SELECT 13 (MW) ==================================================================== Test                             Result       Flag       Units Drug Present and Declared for Prescription Verification   Hydrocodone                    294          EXPECTED   ng/mg creat   Hydromorphone  80           EXPECTED   ng/mg creat   Dihydrocodeine                 177          EXPECTED   ng/mg creat   Norhydrocodone                 899          EXPECTED    ng/mg creat    Sources of hydrocodone include scheduled prescription    medications. Hydromorphone, dihydrocodeine and norhydrocodone are    expected metabolites of hydrocodone. Hydromorphone and    dihydrocodeine are also available as scheduled prescription    medications. ==================================================================== Test                      Result    Flag   Units      Ref Range   Creatinine              111              mg/dL      >=16 ==================================================================== Declared Medications:  The flagging and interpretation on this report are based on the  following declared medications.  Unexpected results may arise from  inaccuracies in the declared medications.  **Note: The testing scope of this panel includes these medications:  Hydrocodone (Norco)  **Note: The testing scope of this panel does not include following  reported medications:  Acetaminophen (Norco)  Amlodipine (Norvasc)  Atenolol (Tenormin)  Cyanocobalamin  Hydrochlorothiazide (HCTZ)  Losartan (Losartan Potassium)  Magnesium  Multivitamin  Nortriptyline (Pamelor)  Omeprazole  Sertraline (Zoloft)  Sumatriptan (Imitrex)  Supplement (Krill Oil)  Supplement (OsCal)  Tizanidine (Zanaflex)  Vitamin D ==================================================================== For clinical consultation, please call [phone]. ====================================================================    UDS interpretation: Compliant          Medication Assessment Form: Reviewed. Patient indicates being compliant with therapy Treatment compliance: Compliant Risk Assessment: Aberrant Behavior: None observed today Substance Use Disorder (SUD) Risk Level: Low-to-moderate Risk of opioid abuse or dependence: 0.7-3.0% with doses ? 36 MME/day and 6.1-26% with doses ? 120 MME/day. Opioid Risk Tool (ORT) Score: 1   Low Risk for SUD (Score <3) Depression Scale  Score: PHQ-2: 0   No depression (0) PHQ-9: 0   No depression (0-4)  Pharmacologic Plan: No change in therapy, at this time  Laboratory Chemistry  Inflammation Markers Lab Results  Component Value Date   ESRSEDRATE 43 (H) 05/23/2016   CRP 1.2 (H) 05/23/2016   Renal Function Lab Results  Component Value Date   BUN 15 05/23/2016   CREATININE 0.97 05/23/2016   GFRAA >60 05/23/2016   GFRNONAA >60 05/23/2016   Hepatic Function Lab Results  Component Value Date   AST 24 05/23/2016   ALT 18 05/23/2016   ALBUMIN 4.1 05/23/2016   Electrolytes Lab Results  Component Value Date   NA 141 05/23/2016   K 4.1 05/23/2016   CL 105 05/23/2016   CALCIUM 9.4 05/23/2016   MG 2.2 05/23/2016   Pain Modulating Vitamins Lab Results  Component Value Date   25OHVITD1 36 05/23/2016   25OHVITD2 <1.0 05/23/2016   25OHVITD3 36 05/23/2016   VITAMINB12 1,285 (H) 05/23/2016   Coagulation Parameters No results found for: INR, LABPROT, APTT, PLT Cardiovascular No results found for: BNP, HGB, HCT  Note: Lab results reviewed.  Recent Diagnostic Imaging  No results found. Meds  The  patient has a current medication list which includes the following prescription(s): amlodipine, atenolol, vitamin d, cyanocobalamin, hydrocodone-acetaminophen, hydrocodone-acetaminophen, hydrocodone-acetaminophen, losartan-hydrochlorothiazide, multivitamin, nortriptyline, omeprazole, sertraline, sumatriptan, and tizanidine.  Current Outpatient Prescriptions on File Prior to Visit  Medication Sig  . amLODipine (NORVASC) 5 MG tablet Take 5 mg by mouth daily.  Marland Kitchen atenolol (TENORMIN) 25 MG tablet Take 25 mg by mouth daily.  . Cholecalciferol (VITAMIN D) 2000 UNITS tablet Take 2,000 Units by mouth daily.  . cyanocobalamin (TH VITAMIN B12) 100 MCG tablet Take by mouth.  . losartan-hydrochlorothiazide (HYZAAR) 100-25 MG tablet Take 1 tablet by mouth daily.  . Multiple Vitamin (MULTIVITAMIN) capsule Take 1 capsule by mouth  daily.  . nortriptyline (PAMELOR) 25 MG capsule Take 25 mg by mouth at bedtime. Take 1-2 at bedtime  . omeprazole (PRILOSEC OTC) 20 MG tablet Take 20 mg by mouth daily as needed.   . sertraline (ZOLOFT) 100 MG tablet Take 150 mg by mouth daily.   No current facility-administered medications on file prior to visit.    ROS  Constitutional: Denies any fever or chills Gastrointestinal: No reported hemesis, hematochezia, vomiting, or acute GI distress Musculoskeletal: Denies any acute onset joint swelling, redness, loss of ROM, or weakness Neurological: No reported episodes of acute onset apraxia, aphasia, dysarthria, agnosia, amnesia, paralysis, loss of coordination, or loss of consciousness  Allergies  Ms. Zaun is allergic to 2,4-d dimethylamine (amisol); diphenhydramine; diphenhydramine hcl; ibuprofen; morphine and related; nsaids; tetanus toxoids; diclofenac; morphine; naproxen; and voltaren  [diclofenac sodium].  PFSH  Medical:  Ms. Heinle  has a past medical history of Acid reflux (10/27/2007); Arthritis (07/27/2007); Avitaminosis D (10/12/2012); Cataract; Depression; Gonalgia (05/30/2011); H/O cataract extraction (01/20/2015); History of migraine (08/30/2015); Hypertension; Migraines; Osteoarthritis; and Status post bariatric surgery (08/30/2015). Family: family history includes Cancer in her mother; Heart disease in her father. Surgical:  has a past surgical history that includes Abdominal hysterectomy; Bariatric Surgery; Wrist surgery; Knee surgery; and Tonsillectomy. Tobacco:  reports that she has never smoked. She has never used smokeless tobacco. Alcohol:  reports that she does not drink alcohol. Drug:  reports that she does not use drugs.  Constitutional Exam  General appearance: Well nourished, well developed, and well hydrated. In no acute distress Vitals:   08/05/16 0833  BP: 133/75  Pulse: 71  Resp: 18  Temp: 98.3 F (36.8 C)  SpO2: 100%  Weight: 235 lb (106.6 kg)   Height: 5\' 6"  (1.676 m)  BMI Assessment: Estimated body mass index is 37.93 kg/m as calculated from the following:   Height as of this encounter: 5\' 6"  (1.676 m).   Weight as of this encounter: 235 lb (106.6 kg).   BMI interpretation: (35-39.9 kg/m2) = Severe obesity (Class II): This range is associated with a 136% higher incidence of chronic pain. BMI Readings from Last 4 Encounters:  08/05/16 37.93 kg/m  05/23/16 37.93 kg/m  02/23/16 38.74 kg/m  11/22/15 38.74 kg/m   Wt Readings from Last 4 Encounters:  08/05/16 235 lb (106.6 kg)  05/23/16 235 lb (106.6 kg)  02/23/16 240 lb (108.9 kg)  11/22/15 240 lb (108.9 kg)  Psych/Mental status: Alert and oriented x 3 (person, place, & time) Eyes: PERLA Respiratory: No evidence of acute respiratory distress  Cervical Spine Exam  Inspection: No masses, redness, or swelling Alignment: Symmetrical Functional ROM: Unrestricted ROM Stability: No instability detected Muscle strength & Tone: Functionally intact Sensory: Unimpaired Palpation: Non-contributory  Upper Extremity (UE) Exam    Side: Right upper extremity  Side: Left  upper extremity  Inspection: No masses, redness, swelling, or asymmetry  Inspection: No masses, redness, swelling, or asymmetry  Functional ROM: Unrestricted ROM         Functional ROM: Unrestricted ROM          Muscle strength & Tone: Functionally intact  Muscle strength & Tone: Functionally intact  Sensory: Unimpaired  Sensory: Unimpaired  Palpation: Non-contributory  Palpation: Non-contributory   Thoracic Spine Exam  Inspection: No masses, redness, or swelling Alignment: Symmetrical Functional ROM: Unrestricted ROM Stability: No instability detected Sensory: Unimpaired Muscle strength & Tone: Functionally intact Palpation: Non-contributory  Lumbar Spine Exam  Inspection: No masses, redness, or swelling Alignment: Symmetrical Functional ROM: Decreased ROM Stability: No instability detected Muscle  strength & Tone: Functionally intact Sensory: Movement-associated pain Palpation: Complains of area being tender to palpation Provocative Tests: Lumbar Hyperextension and rotation test: Positive bilaterally for facet joint pain. Patrick's Maneuver: Positive for bilateral S-I joint pain           Gait & Posture Assessment  Ambulation: Unassisted Gait: Relatively normal for age and body habitus Posture: WNL   Lower Extremity Exam    Side: Right lower extremity  Side: Left lower extremity  Inspection: No masses, redness, swelling, or asymmetry  Inspection: No masses, redness, swelling, or asymmetry  Functional ROM: Unrestricted ROM          Functional ROM: Unrestricted ROM          Muscle strength & Tone: Functionally intact  Muscle strength & Tone: Functionally intact  Sensory: Unimpaired  Sensory: Unimpaired  Palpation: Non-contributory  Palpation: Non-contributory   Assessment  Primary Diagnosis & Pertinent Problem List: The primary encounter diagnosis was Chronic pain syndrome. Diagnoses of Opiate use, Long term current use of opiate analgesic, Encounter for therapeutic drug level monitoring, Chronic knee pain  (Location of Primary Source of Pain) (Bilateral) (L>R), and Primary osteoarthritis of both knees were also pertinent to this visit.  Visit Diagnosis: 1. Chronic pain syndrome   2. Opiate use   3. Long term current use of opiate analgesic   4. Encounter for therapeutic drug level monitoring   5. Chronic knee pain  (Location of Primary Source of Pain) (Bilateral) (L>R)   6. Primary osteoarthritis of both knees    Plan of Care  Pharmacotherapy (Medications Ordered): Meds ordered this encounter  Medications  . HYDROcodone-acetaminophen (NORCO) 7.5-325 MG tablet    Sig: Take 1 tablet by mouth every 8 (eight) hours as needed for severe pain.    Dispense:  90 tablet    Refill:  0    Do not place this medication, or any other prescription from our practice, on "Automatic  Refill". Patient may have prescription filled one day early if pharmacy is closed on scheduled refill date. Do not fill until: 08/21/16 To last until: 09/20/16  . HYDROcodone-acetaminophen (NORCO) 7.5-325 MG tablet    Sig: Take 1 tablet by mouth every 8 (eight) hours as needed for severe pain.    Dispense:  90 tablet    Refill:  0    Do not place this medication, or any other prescription from our practice, on "Automatic Refill". Patient may have prescription filled one day early if pharmacy is closed on scheduled refill date. Do not fill until: 09/20/16 To last until: 10/20/16  . HYDROcodone-acetaminophen (NORCO) 7.5-325 MG tablet    Sig: Take 1 tablet by mouth every 8 (eight) hours as needed for severe pain.    Dispense:  90 tablet  Refill:  0    Do not place this medication, or any other prescription from our practice, on "Automatic Refill". Patient may have prescription filled one day early if pharmacy is closed on scheduled refill date. Do not fill until: 10/20/16 To last until: 11/19/16   New Prescriptions   No medications on file   Medications administered during this visit: Ms. Mallet had no medications administered during this visit. Lab-work, Procedure(s), & Referral(s) Ordered: Orders Placed This Encounter  Procedures  . ToxASSURE Select 13 (MW), Urine   Imaging & Referral(s) Ordered: None  Interventional Therapies: Scheduled:  None at this time.    Considering:  Bilateral intra-articular knee injections with Hyalgan versus genicular nerve blocks followed by radiofrequency.    PRN Procedures:  Bilateral intra-articular knee injection with steroids versus Hyalgan.    Requested PM Follow-up: Return in 3 months (on 11/07/2016) for Med-Mgmt, In addition.  Future Appointments Date Time Provider Department Center  11/07/2016 7:45 AM Delano Metz, MD [facility] None   Primary Care Physician: Sophia Gardener, MD Location: [facility] Outpatient Pain  Management Facility Note by: Sophia Jackson, M.D, DABA, DABAPM, DABPM, DABIPP, FIPP  Pain Score Disclaimer: We use the NRS-11 scale. This is a self-reported, subjective measurement of pain severity with only modest accuracy. It is used primarily to identify changes within a particular patient. It must be understood that outpatient pain scales are significantly less accurate that those used for research, where they can be applied under ideal controlled circumstances with minimal exposure to variables. In reality, the score is likely to be a combination of pain intensity and pain affect, where pain affect describes the degree of emotional arousal or changes in action readiness caused by the sensory experience of pain. Factors such as social and work situation, setting, emotional state, anxiety levels, expectation, and prior pain experience may influence pain perception and show large inter-individual differences that may also be affected by time variables.  Patient instructions provided during this appointment: Patient Instructions   GENERAL RISKS AND COMPLICATIONS  What are the risk, side effects and possible complications? Generally speaking, most procedures are safe.  However, with any procedure there are risks, side effects, and the possibility of complications.  The risks and complications are dependent upon the sites that are lesioned, or the type of nerve block to be performed.  The closer the procedure is to the spine, the more serious the risks are.  Great care is taken when placing the radio frequency needles, block needles or lesioning probes, but sometimes complications can occur. 1. Infection: Any time there is an injection through the skin, there is a risk of infection.  This is why sterile conditions are used for these blocks.  There are four possible types of infection. 1. Localized skin infection. 2. Central Nervous System Infection-This can be in the form of Meningitis, which can  be deadly. 3. Epidural Infections-This can be in the form of an epidural abscess, which can cause pressure inside of the spine, causing compression of the spinal cord with subsequent paralysis. This would require an emergency surgery to decompress, and there are no guarantees that the patient would recover from the paralysis. 4. Discitis-This is an infection of the intervertebral discs.  It occurs in about 1% of discography procedures.  It is difficult to treat and it may lead to surgery.        2. Pain: the needles have to go through skin and soft tissues, will cause soreness.  3. Damage to internal structures:  The nerves to be lesioned may be near blood vessels or    other nerves which can be potentially damaged.       4. Bleeding: Bleeding is more common if the patient is taking blood thinners such as  aspirin, Coumadin, Ticiid, Plavix, etc., or if he/she have some genetic predisposition  such as hemophilia. Bleeding into the spinal canal can cause compression of the spinal  cord with subsequent paralysis.  This would require an emergency surgery to  decompress and there are no guarantees that the patient would recover from the  paralysis.       5. Pneumothorax:  Puncturing of a lung is a possibility, every time a needle is introduced in  the area of the chest or upper back.  Pneumothorax refers to free air around the  collapsed lung(s), inside of the thoracic cavity (chest cavity).  Another two possible  complications related to a similar event would include: Hemothorax and Chylothorax.   These are variations of the Pneumothorax, where instead of air around the collapsed  lung(s), you may have blood or chyle, respectively.       6. Spinal headaches: They may occur with any procedures in the area of the spine.       7. Persistent CSF (Cerebro-Spinal Fluid) leakage: This is a rare problem, but may occur  with prolonged intrathecal or epidural catheters either due to the formation of a fistulous   track or a dural tear.       8. Nerve damage: By working so close to the spinal cord, there is always a possibility of  nerve damage, which could be as serious as a permanent spinal cord injury with  paralysis.       9. Death:  Although rare, severe deadly allergic reactions known as "Anaphylactic  reaction" can occur to any of the medications used.      10. Worsening of the symptoms:  We can always make thing worse.  What are the chances of something like this happening? Chances of any of this occuring are extremely low.  By statistics, you have more of a chance of getting killed in a motor vehicle accident: while driving to the hospital than any of the above occurring .  Nevertheless, you should be aware that they are possibilities.  In general, it is similar to taking a shower.  Everybody knows that you can slip, hit your head and get killed.  Does that mean that you should not shower again?  Nevertheless always keep in mind that statistics do not mean anything if you happen to be on the wrong side of them.  Even if a procedure has a 1 (one) in a 1,000,000 (million) chance of going wrong, it you happen to be that one..Also, keep in mind that by statistics, you have more of a chance of having something go wrong when taking medications.  Who should not have this procedure? If you are on a blood thinning medication (e.g. Coumadin, Plavix, see list of "Blood Thinners"), or if you have an active infection going on, you should not have the procedure.  If you are taking any blood thinners, please inform your physician.  How should I prepare for this procedure?  Do not eat or drink anything at least six hours prior to the procedure.  Bring a driver with you .  It cannot be a taxi.  Come accompanied by an adult that can drive you back, and that  is strong enough to help you if your legs get weak or numb from the local anesthetic.  Take all of your medicines the morning of the procedure with just enough  water to swallow them.  If you have diabetes, make sure that you are scheduled to have your procedure done first thing in the morning, whenever possible.  If you have diabetes, take only half of your insulin dose and notify our nurse that you have done so as soon as you arrive at the clinic.  If you are diabetic, but only take blood sugar pills (oral hypoglycemic), then do not take them on the morning of your procedure.  You may take them after you have had the procedure.  Do not take aspirin or any aspirin-containing medications, at least eleven (11) days prior to the procedure.  They may prolong bleeding.  Wear loose fitting clothing that may be easy to take off and that you would not mind if it got stained with Betadine or blood.  Do not wear any jewelry or perfume  Remove any nail coloring.  It will interfere with some of our monitoring equipment.  NOTE: Remember that this is not meant to be interpreted as a complete list of all possible complications.  Unforeseen problems may occur.  BLOOD THINNERS The following drugs contain aspirin or other products, which can cause increased bleeding during surgery and should not be taken for 2 weeks prior to and 1 week after surgery.  If you should need take something for relief of minor pain, you may take acetaminophen which is found in Tylenol,m Datril, Anacin-3 and Panadol. It is not blood thinner. The products listed below are.  Do not take any of the products listed below in addition to any listed on your instruction sheet.  A.P.C or A.P.C with Codeine Codeine Phosphate Capsules #3 Ibuprofen Ridaura  ABC compound Congesprin Imuran rimadil  Advil Cope Indocin Robaxisal  Alka-Seltzer Effervescent Pain Reliever and Antacid Coricidin or Coricidin-D  Indomethacin Rufen  Alka-Seltzer plus Cold Medicine Cosprin Ketoprofen S-A-C Tablets  Anacin Analgesic Tablets or Capsules Coumadin Korlgesic Salflex  Anacin Extra Strength Analgesic tablets or  capsules CP-2 Tablets Lanoril Salicylate  Anaprox Cuprimine Capsules Levenox Salocol  Anexsia-D Dalteparin Magan Salsalate  Anodynos Darvon compound Magnesium Salicylate Sine-off  Ansaid Dasin Capsules Magsal Sodium Salicylate  Anturane Depen Capsules Marnal Soma  APF Arthritis pain formula Dewitt's Pills Measurin Stanback  Argesic Dia-Gesic Meclofenamic Sulfinpyrazone  Arthritis Bayer Timed Release Aspirin Diclofenac Meclomen Sulindac  Arthritis pain formula Anacin Dicumarol Medipren Supac  Analgesic (Safety coated) Arthralgen Diffunasal Mefanamic Suprofen  Arthritis Strength Bufferin Dihydrocodeine Mepro Compound Suprol  Arthropan liquid Dopirydamole Methcarbomol with Aspirin Synalgos  ASA tablets/Enseals Disalcid Micrainin Tagament  Ascriptin Doan's Midol Talwin  Ascriptin A/D Dolene Mobidin Tanderil  Ascriptin Extra Strength Dolobid Moblgesic Ticlid  Ascriptin with Codeine Doloprin or Doloprin with Codeine Momentum Tolectin  Asperbuf Duoprin Mono-gesic Trendar  Aspergum Duradyne Motrin or Motrin IB Triminicin  Aspirin plain, buffered or enteric coated Durasal Myochrisine Trigesic  Aspirin Suppositories Easprin Nalfon Trillsate  Aspirin with Codeine Ecotrin Regular or Extra Strength Naprosyn Uracel  Atromid-S Efficin Naproxen Ursinus  Auranofin Capsules Elmiron Neocylate Vanquish  Axotal Emagrin Norgesic Verin  Azathioprine Empirin or Empirin with Codeine Normiflo Vitamin E  Azolid Emprazil Nuprin Voltaren  Bayer Aspirin plain, buffered or children's or timed BC Tablets or powders Encaprin Orgaran Warfarin Sodium  Buff-a-Comp Enoxaparin Orudis Zorpin  Buff-a-Comp with Codeine Equegesic Os-Cal-Gesic   Buffaprin Excedrin plain, buffered or  Extra Strength Oxalid   Bufferin Arthritis Strength Feldene Oxphenbutazone   Bufferin plain or Extra Strength Feldene Capsules Oxycodone with Aspirin   Bufferin with Codeine Fenoprofen Fenoprofen Pabalate or Pabalate-SF   Buffets II Flogesic  Panagesic   Buffinol plain or Extra Strength Florinal or Florinal with Codeine Panwarfarin   Buf-Tabs Flurbiprofen Penicillamine   Butalbital Compound Four-way cold tablets Penicillin   Butazolidin Fragmin Pepto-Bismol   Carbenicillin Geminisyn Percodan   Carna Arthritis Reliever Geopen Persantine   Carprofen Gold's salt Persistin   Chloramphenicol Goody's Phenylbutazone   Chloromycetin Haltrain Piroxlcam   Clmetidine heparin Plaquenil   Cllnoril Hyco-pap Ponstel   Clofibrate Hydroxy chloroquine Propoxyphen         Before stopping any of these medications, be sure to consult the physician who ordered them.  Some, such as Coumadin (Warfarin) are ordered to prevent or treat serious conditions such as "deep thrombosis", "pumonary embolisms", and other heart problems.  The amount of time that you may need off of the medication may also vary with the medication and the reason for which you were taking it.  If you are taking any of these medications, please make sure you notify your pain physician before you undergo any procedures.          Knee Injection A knee injection is a procedure to get medicine into your knee joint. Your health care provider puts a needle into the joint and injects medicine with an attached syringe. The injected medicine may relieve the pain, swelling, and stiffness of arthritis. The injected medicine may also help to lubricate and cushion your knee joint. You may need more than one injection. [facility] KNOW ABOUT:  Any allergies you have.  All medicines you are taking, including vitamins, herbs, eye drops, creams, and over-the-counter medicines.  Previous problems you or members of your family have had with the use of anesthetics.  Any blood disorders you have.  Previous surgeries you have had.  Any medical conditions you may have. RISKS AND COMPLICATIONS Generally, this is a safe procedure. However, problems may occur,  including:  Infection.  Bleeding.  Worsening symptoms.  Damage to the area around your knee.  Allergic reaction to any of the medicines.  Skin reactions from repeated injections. BEFORE THE PROCEDURE  Ask your health care provider about changing or stopping your regular medicines. This is especially important if you are taking diabetes medicines or blood thinners.  Plan to have someone take you home after the procedure. PROCEDURE  You will sit or lie down in a position for your knee to be treated.  The skin over your kneecap will be cleaned with a germ-killing solution (antiseptic).  You will be given a medicine that numbs the area (local anesthetic). You may feel some stinging.  After your knee becomes numb, you will have a second injection. This is the medicine. This needle is carefully placed between your kneecap and your knee. The medicine is injected into the joint space.  At the end of the procedure, the needle will be removed.  A bandage (dressing) may be placed over the injection site. The procedure may vary among health care providers and hospitals. AFTER THE PROCEDURE  You may have to move your knee through its full range of motion. This helps to get all of the medicine into your joint space.  Your blood pressure, heart rate, breathing rate, and blood oxygen level will be monitored often until the medicines you were given have worn  off.  You will be watched to make sure that you do not have a reaction to the injected medicine.   This information is not intended to replace advice given to you by your health care provider. Make sure you discuss any questions you have with your health care provider.   Document Released: 01/12/2007 Document Revised: 11/11/2014 Document Reviewed: 08/31/2014 Elsevier Interactive Patient Education [employer]

## 2016-08-05 NOTE — Patient Instructions (Addendum)
GENERAL RISKS AND COMPLICATIONS  What are the risk, side effects and possible complications? Generally speaking, most procedures are safe.  However, with any procedure there are risks, side effects, and the possibility of complications.  The risks and complications are dependent upon the sites that are lesioned, or the type of nerve block to be performed.  The closer the procedure is to the spine, the more serious the risks are.  Great care is taken when placing the radio frequency needles, block needles or lesioning probes, but sometimes complications can occur. 1. Infection: Any time there is an injection through the skin, there is a risk of infection.  This is why sterile conditions are used for these blocks.  There are four possible types of infection. 1. Localized skin infection. 2. Central Nervous System Infection-This can be in the form of Meningitis, which can be deadly. 3. Epidural Infections-This can be in the form of an epidural abscess, which can cause pressure inside of the spine, causing compression of the spinal cord with subsequent paralysis. This would require an emergency surgery to decompress, and there are no guarantees that the patient would recover from the paralysis. 4. Discitis-This is an infection of the intervertebral discs.  It occurs in about 1% of discography procedures.  It is difficult to treat and it may lead to surgery.        2. Pain: the needles have to go through skin and soft tissues, will cause soreness.       3. Damage to internal structures:  The nerves to be lesioned may be near blood vessels or    other nerves which can be potentially damaged.       4. Bleeding: Bleeding is more common if the patient is taking blood thinners such as  aspirin, Coumadin, Ticiid, Plavix, etc., or if he/she have some genetic predisposition  such as hemophilia. Bleeding into the spinal canal can cause compression of the spinal  cord with subsequent paralysis.  This would require an  emergency surgery to  decompress and there are no guarantees that the patient would recover from the  paralysis.       5. Pneumothorax:  Puncturing of a lung is a possibility, every time a needle is introduced in  the area of the chest or upper back.  Pneumothorax refers to free air around the  collapsed lung(s), inside of the thoracic cavity (chest cavity).  Another two possible  complications related to a similar event would include: Hemothorax and Chylothorax.   These are variations of the Pneumothorax, where instead of air around the collapsed  lung(s), you may have blood or chyle, respectively.       6. Spinal headaches: They may occur with any procedures in the area of the spine.       7. Persistent CSF (Cerebro-Spinal Fluid) leakage: This is a rare problem, but may occur  with prolonged intrathecal or epidural catheters either due to the formation of a fistulous  track or a dural tear.       8. Nerve damage: By working so close to the spinal cord, there is always a possibility of  nerve damage, which could be as serious as a permanent spinal cord injury with  paralysis.       9. Death:  Although rare, severe deadly allergic reactions known as "Anaphylactic  reaction" can occur to any of the medications used.      10. Worsening of the symptoms:  We can always make thing worse.    What are the chances of something like this happening? Chances of any of this occuring are extremely low.  By statistics, you have more of a chance of getting killed in a motor vehicle accident: while driving to the hospital than any of the above occurring .  Nevertheless, you should be aware that they are possibilities.  In general, it is similar to taking a shower.  Everybody knows that you can slip, hit your head and get killed.  Does that mean that you should not shower again?  Nevertheless always keep in mind that statistics do not mean anything if you happen to be on the wrong side of them.  Even if a procedure has a 1  (one) in a 1,000,000 (million) chance of going wrong, it you happen to be that one..Also, keep in mind that by statistics, you have more of a chance of having something go wrong when taking medications.  Who should not have this procedure? If you are on a blood thinning medication (e.g. Coumadin, Plavix, see list of "Blood Thinners"), or if you have an active infection going on, you should not have the procedure.  If you are taking any blood thinners, please inform your physician.  How should I prepare for this procedure?  Do not eat or drink anything at least six hours prior to the procedure.  Bring a driver with you .  It cannot be a taxi.  Come accompanied by an adult that can drive you back, and that is strong enough to help you if your legs get weak or numb from the local anesthetic.  Take all of your medicines the morning of the procedure with just enough water to swallow them.  If you have diabetes, make sure that you are scheduled to have your procedure done first thing in the morning, whenever possible.  If you have diabetes, take only half of your insulin dose and notify our nurse that you have done so as soon as you arrive at the clinic.  If you are diabetic, but only take blood sugar pills (oral hypoglycemic), then do not take them on the morning of your procedure.  You may take them after you have had the procedure.  Do not take aspirin or any aspirin-containing medications, at least eleven (11) days prior to the procedure.  They may prolong bleeding.  Wear loose fitting clothing that may be easy to take off and that you would not mind if it got stained with Betadine or blood.  Do not wear any jewelry or perfume  Remove any nail coloring.  It will interfere with some of our monitoring equipment.  NOTE: Remember that this is not meant to be interpreted as a complete list of all possible complications.  Unforeseen problems may occur.  BLOOD THINNERS The following drugs  contain aspirin or other products, which can cause increased bleeding during surgery and should not be taken for 2 weeks prior to and 1 week after surgery.  If you should need take something for relief of minor pain, you may take acetaminophen which is found in Tylenol,m Datril, Anacin-3 and Panadol. It is not blood thinner. The products listed below are.  Do not take any of the products listed below in addition to any listed on your instruction sheet.  A.P.C or A.P.C with Codeine Codeine Phosphate Capsules #3 Ibuprofen Ridaura  ABC compound Congesprin Imuran rimadil  Advil Cope Indocin Robaxisal  Alka-Seltzer Effervescent Pain Reliever and Antacid Coricidin or Coricidin-D  Indomethacin Rufen    Alka-Seltzer plus Cold Medicine Cosprin Ketoprofen S-A-C Tablets  Anacin Analgesic Tablets or Capsules Coumadin Korlgesic Salflex  Anacin Extra Strength Analgesic tablets or capsules CP-2 Tablets Lanoril Salicylate  Anaprox Cuprimine Capsules Levenox Salocol  Anexsia-D Dalteparin Magan Salsalate  Anodynos Darvon compound Magnesium Salicylate Sine-off  Ansaid Dasin Capsules Magsal Sodium Salicylate  Anturane Depen Capsules Marnal Soma  APF Arthritis pain formula Dewitt's Pills Measurin Stanback  Argesic Dia-Gesic Meclofenamic Sulfinpyrazone  Arthritis Bayer Timed Release Aspirin Diclofenac Meclomen Sulindac  Arthritis pain formula Anacin Dicumarol Medipren Supac  Analgesic (Safety coated) Arthralgen Diffunasal Mefanamic Suprofen  Arthritis Strength Bufferin Dihydrocodeine Mepro Compound Suprol  Arthropan liquid Dopirydamole Methcarbomol with Aspirin Synalgos  ASA tablets/Enseals Disalcid Micrainin Tagament  Ascriptin Doan's Midol Talwin  Ascriptin A/D Dolene Mobidin Tanderil  Ascriptin Extra Strength Dolobid Moblgesic Ticlid  Ascriptin with Codeine Doloprin or Doloprin with Codeine Momentum Tolectin  Asperbuf Duoprin Mono-gesic Trendar  Aspergum Duradyne Motrin or Motrin IB Triminicin  Aspirin  plain, buffered or enteric coated Durasal Myochrisine Trigesic  Aspirin Suppositories Easprin Nalfon Trillsate  Aspirin with Codeine Ecotrin Regular or Extra Strength Naprosyn Uracel  Atromid-S Efficin Naproxen Ursinus  Auranofin Capsules Elmiron Neocylate Vanquish  Axotal Emagrin Norgesic Verin  Azathioprine Empirin or Empirin with Codeine Normiflo Vitamin E  Azolid Emprazil Nuprin Voltaren  Bayer Aspirin plain, buffered or children's or timed BC Tablets or powders Encaprin Orgaran Warfarin Sodium  Buff-a-Comp Enoxaparin Orudis Zorpin  Buff-a-Comp with Codeine Equegesic Os-Cal-Gesic   Buffaprin Excedrin plain, buffered or Extra Strength Oxalid   Bufferin Arthritis Strength Feldene Oxphenbutazone   Bufferin plain or Extra Strength Feldene Capsules Oxycodone with Aspirin   Bufferin with Codeine Fenoprofen Fenoprofen Pabalate or Pabalate-SF   Buffets II Flogesic Panagesic   Buffinol plain or Extra Strength Florinal or Florinal with Codeine Panwarfarin   Buf-Tabs Flurbiprofen Penicillamine   Butalbital Compound Four-way cold tablets Penicillin   Butazolidin Fragmin Pepto-Bismol   Carbenicillin Geminisyn Percodan   Carna Arthritis Reliever Geopen Persantine   Carprofen Gold's salt Persistin   Chloramphenicol Goody's Phenylbutazone   Chloromycetin Haltrain Piroxlcam   Clmetidine heparin Plaquenil   Cllnoril Hyco-pap Ponstel   Clofibrate Hydroxy chloroquine Propoxyphen         Before stopping any of these medications, be sure to consult the physician who ordered them.  Some, such as Coumadin (Warfarin) are ordered to prevent or treat serious conditions such as "deep thrombosis", "pumonary embolisms", and other heart problems.  The amount of time that you may need off of the medication may also vary with the medication and the reason for which you were taking it.  If you are taking any of these medications, please make sure you notify your pain physician before you undergo any  procedures.         Knee Injection A knee injection is a procedure to get medicine into your knee joint. Your health care provider puts a needle into the joint and injects medicine with an attached syringe. The injected medicine may relieve the pain, swelling, and stiffness of arthritis. The injected medicine may also help to lubricate and cushion your knee joint. You may need more than one injection. LET YOUR HEALTH CARE PROVIDER KNOW ABOUT:  Any allergies you have.  All medicines you are taking, including vitamins, herbs, eye drops, creams, and over-the-counter medicines.  Previous problems you or members of your family have had with the use of anesthetics.  Any blood disorders you have.  Previous surgeries you have had.  Any medical conditions you   may have. RISKS AND COMPLICATIONS Generally, this is a safe procedure. However, problems may occur, including:  Infection.  Bleeding.  Worsening symptoms.  Damage to the area around your knee.  Allergic reaction to any of the medicines.  Skin reactions from repeated injections. BEFORE THE PROCEDURE  Ask your health care provider about changing or stopping your regular medicines. This is especially important if you are taking diabetes medicines or blood thinners.  Plan to have someone take you home after the procedure. PROCEDURE  You will sit or lie down in a position for your knee to be treated.  The skin over your kneecap will be cleaned with a germ-killing solution (antiseptic).  You will be given a medicine that numbs the area (local anesthetic). You may feel some stinging.  After your knee becomes numb, you will have a second injection. This is the medicine. This needle is carefully placed between your kneecap and your knee. The medicine is injected into the joint space.  At the end of the procedure, the needle will be removed.  A bandage (dressing) may be placed over the injection site. The procedure may vary  among health care providers and hospitals. AFTER THE PROCEDURE  You may have to move your knee through its full range of motion. This helps to get all of the medicine into your joint space.  Your blood pressure, heart rate, breathing rate, and blood oxygen level will be monitored often until the medicines you were given have worn off.  You will be watched to make sure that you do not have a reaction to the injected medicine.   This information is not intended to replace advice given to you by your health care provider. Make sure you discuss any questions you have with your health care provider.   Document Released: 01/12/2007 Document Revised: 11/11/2014 Document Reviewed: 08/31/2014 Elsevier Interactive Patient Education 2016 [employer]  

## 2016-08-13 LAB — TOXASSURE SELECT 13 (MW), URINE

## 2016-09-09 ENCOUNTER — Telehealth: Payer: Self-pay | Admitting: *Deleted

## 2016-09-09 NOTE — Telephone Encounter (Signed)
I spoke with patient and gave her the CPT code for knee joint injection 20610.

## 2016-11-07 ENCOUNTER — Ambulatory Visit: Payer: BC Managed Care – PPO | Admitting: Pain Medicine

## 2016-11-07 DIAGNOSIS — G894 Chronic pain syndrome: Secondary | ICD-10-CM | POA: Insufficient documentation

## 2016-11-07 NOTE — Progress Notes (Deleted)
Patient's Name: Sophia Jackson  MRN: [id number]  Referring Provider: Stann Mainland, MD  DOB: 04/16/1961  PCP: Stann Mainland, MD  DOS: 11/07/2016  Note by: Kathlen Brunswick. Dossie Arbour, MD  Service setting: Ambulatory outpatient  Specialty: Interventional Pain Management  Location: [facility] (AMB) Pain Management Facility    Patient type: Established   Primary Reason(s) for Visit: Encounter for prescription drug management (Level of risk: moderate) CC: No chief complaint on file.  HPI  Sophia Jackson is a 56 y.o. year old, female patient, who comes today for a medication management evaluation. She has Opiate use; Long term prescription opiate use; Long term current use of opiate analgesic; Encounter for therapeutic drug level monitoring; Chronic knee pain  (Location of Primary Source of Pain) (Bilateral) (L>R); Status post bariatric surgery; Morbid obesity ([place]); Osteoarthrosis; Cataract, nuclear; Allergic rhinitis; Age-related macular degeneration; Common Migraine (Without Aura); Cornea guttata; Clinical depression; HLD (hyperlipidemia); Hypertension; Gastroduodenal ulcer (Peptic Ulcer); Cataract, post subcapsular polar senile; Opioid use agreement exists; Myofascial pain; Muscle spasms of lower extremity; GERD (gastroesophageal reflux disease); Encounter for chronic pain management; Osteoarthritis of knee (Bilateral) (L>R); Vitamin D insufficiency; Elevated C-reactive protein (CRP); Age-related nuclear cataract of both eyes; and Chronic pain syndrome on her problem list. Her primarily concern today is the No chief complaint on file.  Pain Assessment: Self-Reported Pain Score:  /10             Reported level is compatible with observation.          Sophia Jackson was last seen on 08/05/2016 for medication management. During today's appointment we reviewed Sophia Jackson's chronic pain status, as well as her outpatient medication regimen.  The patient  reports that she does not use drugs. Her body mass index is  unknown because there is no height or weight on file.  Further details on both, my assessment(s), as well as the proposed treatment plan, please see below.  Controlled Substance Pharmacotherapy Assessment REMS (Risk Evaluation and Mitigation Strategy)  Analgesic:Hydrocodone/APAP 7.5/325 one every 8 hours (22.5 mg/day of hydrocodone) MME/day:22.5 mg/day.  No notes on file Pharmacokinetics: Liberation and absorption (onset of action): WNL Distribution (time to peak effect): WNL Metabolism and excretion (duration of action): WNL         Pharmacodynamics: Desired effects: Analgesia: Sophia Jackson reports >50% benefit. Functional ability: Patient reports that medication allows her to accomplish basic ADLs Clinically meaningful improvement in function (CMIF): Sustained CMIF goals met Perceived effectiveness: Described as relatively effective, allowing for increase in activities of daily living (ADL) Undesirable effects: Side-effects or Adverse reactions: None reported Monitoring: [place] PMP: Online review of the past 37-monthperiod conducted. Compliant with practice rules and regulations List of all UDS test(s) done:  Lab Results  Component Value Date   TOXASSSELUR FINAL 08/05/2016   [place] 02/23/2016   [place] 11/22/2015   Last UDS on record: ToxAssure Select 13  Date Value Ref Range Status  08/05/2016 FINAL  Final    Comment:    ==================================================================== TOXASSURE SELECT 13 (MW) ==================================================================== Test                             Result       Flag       Units Drug Present and Declared for Prescription Verification   Hydrocodone                    804  EXPECTED   ng/mg creat   Hydromorphone                  258          EXPECTED   ng/mg creat   Dihydrocodeine                 396          EXPECTED   ng/mg creat   Norhydrocodone                 1621          EXPECTED   ng/mg creat    Sources of hydrocodone include scheduled prescription    medications. Hydromorphone, dihydrocodeine and norhydrocodone are    expected metabolites of hydrocodone. Hydromorphone and    dihydrocodeine are also available as scheduled prescription    medications. ==================================================================== Test                      Result    Flag   Units      Ref Range   Creatinine              80               mg/dL      >=20 ==================================================================== Declared Medications:  The flagging and interpretation on this report are based on the  following declared medications.  Unexpected results may arise from  inaccuracies in the declared medications.  **Note: The testing scope of this panel includes these medications:  Hydrocodone (Norco)  **Note: The testing scope of this panel does not include following  reported medications:  Acetaminophen (Norco)  Amlodipine  Atenolol  Cholecalciferol  Cyanocobalamin  Hydrochlorothiazide (Hyzaar)  Losartan (Hyzaar)  Multivitamin  Nortriptyline (Pamelor)  Omeprazole (Prilosec)  Sertraline (Zoloft)  Sumatriptan (Imitrex)  Tizanidine (Zanaflex) ==================================================================== For clinical consultation, please call [phone]. ====================================================================    UDS interpretation: Compliant          Medication Assessment Form: Reviewed. Patient indicates being compliant with therapy Treatment compliance: Compliant Risk Assessment Profile: Aberrant behavior: See prior evaluations. None observed or detected today Comorbid factors increasing risk of overdose: See prior notes. No additional risks detected today Risk of substance use disorder (SUD): Low Opioid Risk Tool (ORT) Total Score:    Interpretation Table:  Score <3 = Low Risk for SUD  Score between 4-7 = Moderate Risk for  SUD  Score >8 = High Risk for Opioid Abuse   Risk Mitigation Strategies:  Patient Counseling: Covered Patient-Prescriber Agreement (PPA): Present and active  Notification to other healthcare providers: Done  Pharmacologic Plan: No change in therapy, at this time  Laboratory Chemistry  Inflammation Markers Lab Results  Component Value Date   ESRSEDRATE 43 (H) 05/23/2016   CRP 1.2 (H) 05/23/2016   Renal Function Lab Results  Component Value Date   BUN 15 05/23/2016   CREATININE 0.97 05/23/2016   GFRAA >60 05/23/2016   GFRNONAA >60 05/23/2016   Hepatic Function Lab Results  Component Value Date   AST 24 05/23/2016   ALT 18 05/23/2016   ALBUMIN 4.1 05/23/2016   Electrolytes Lab Results  Component Value Date   NA 141 05/23/2016   K 4.1 05/23/2016   CL 105 05/23/2016   CALCIUM 9.4 05/23/2016   MG 2.2 05/23/2016   Pain Modulating Vitamins Lab Results  Component Value Date   25OHVITD1 36 05/23/2016   25OHVITD2 <1.0 05/23/2016   25OHVITD3 36 05/23/2016  VITAMINB12 1,285 (H) 05/23/2016   Coagulation Parameters No results found for: INR, LABPROT, APTT, PLT Cardiovascular No results found for: BNP, HGB, HCT Note: Lab results reviewed.  Recent Diagnostic Imaging Review  No results found. Note: Imaging results reviewed.          Meds  The patient has a current medication list which includes the following prescription(s): amlodipine, atenolol, vitamin d, cyanocobalamin, hydrocodone-acetaminophen, hydrocodone-acetaminophen, hydrocodone-acetaminophen, losartan-hydrochlorothiazide, multivitamin, nortriptyline, omeprazole, sertraline, sumatriptan, and tizanidine.  Current Outpatient Prescriptions on File Prior to Visit  Medication Sig  . amLODipine (NORVASC) 5 MG tablet Take 5 mg by mouth daily.  Marland Kitchen atenolol (TENORMIN) 25 MG tablet Take 25 mg by mouth daily.  . Cholecalciferol (VITAMIN D) 2000 UNITS tablet Take 2,000 Units by mouth daily.  . cyanocobalamin (TH VITAMIN  B12) 100 MCG tablet Take by mouth.  Marland Kitchen HYDROcodone-acetaminophen (NORCO) 7.5-325 MG tablet Take 1 tablet by mouth every 8 (eight) hours as needed for severe pain.  Marland Kitchen HYDROcodone-acetaminophen (NORCO) 7.5-325 MG tablet Take 1 tablet by mouth every 8 (eight) hours as needed for severe pain.  Marland Kitchen HYDROcodone-acetaminophen (NORCO) 7.5-325 MG tablet Take 1 tablet by mouth every 8 (eight) hours as needed for severe pain.  Marland Kitchen losartan-hydrochlorothiazide (HYZAAR) 100-25 MG tablet Take 1 tablet by mouth daily.  . Multiple Vitamin (MULTIVITAMIN) capsule Take 1 capsule by mouth daily.  . nortriptyline (PAMELOR) 25 MG capsule Take 25 mg by mouth at bedtime. Take 1-2 at bedtime  . omeprazole (PRILOSEC OTC) 20 MG tablet Take 20 mg by mouth daily as needed.   . sertraline (ZOLOFT) 100 MG tablet Take 150 mg by mouth daily.  . SUMAtriptan (IMITREX) 100 MG tablet Take by mouth.  Marland Kitchen tiZANidine (ZANAFLEX) 4 MG tablet 6 mg (one and a half) po qhs   No current facility-administered medications on file prior to visit.    ROS  Constitutional: Denies any fever or chills Gastrointestinal: No reported hemesis, hematochezia, vomiting, or acute GI distress Musculoskeletal: Denies any acute onset joint swelling, redness, loss of ROM, or weakness Neurological: No reported episodes of acute onset apraxia, aphasia, dysarthria, agnosia, amnesia, paralysis, loss of coordination, or loss of consciousness  Allergies  Sophia Jackson is allergic to 2,4-d dimethylamine (amisol); diphenhydramine; diphenhydramine hcl; ibuprofen; morphine and related; nsaids; tetanus toxoids; diclofenac; morphine; naproxen; and voltaren  [diclofenac sodium].  [place]  Drug: Sophia Jackson  reports that she does not use drugs. Alcohol:  reports that she does not drink alcohol. Tobacco:  reports that she has never smoked. She has never used smokeless tobacco. Medical:  has a past medical history of Acid reflux (10/27/2007); Arthritis (07/27/2007); Avitaminosis D  (10/12/2012); Cataract; Depression; Gonalgia (05/30/2011); H/O cataract extraction (01/20/2015); History of migraine (08/30/2015); Hypertension; Migraines; Osteoarthritis; and Status post bariatric surgery (08/30/2015). Family: family history includes Cancer in her mother; Heart disease in her father.  Past Surgical History:  Procedure Laterality Date  . ABDOMINAL HYSTERECTOMY    . BARIATRIC SURGERY    . KNEE SURGERY    . TONSILLECTOMY    . WRIST SURGERY     Constitutional Exam  General appearance: Well nourished, well developed, and well hydrated. In no apparent acute distress There were no vitals filed for this visit. BMI Assessment: Estimated body mass index is 37.93 kg/m as calculated from the following:   Height as of 08/05/16: '5\' 6"'  (1.676 m).   Weight as of 08/05/16: 235 lb (106.6 kg).  BMI interpretation table: BMI level Category Range association with higher incidence of chronic  pain  <18 kg/m2 Underweight   18.5-24.9 kg/m2 Ideal body weight   25-29.9 kg/m2 Overweight Increased incidence by 20%  30-34.9 kg/m2 Obese (Class I) Increased incidence by 68%  35-39.9 kg/m2 Severe obesity (Class II) Increased incidence by 136%  >40 kg/m2 Extreme obesity (Class III) Increased incidence by 254%   BMI Readings from Last 4 Encounters:  08/05/16 37.93 kg/m  05/23/16 37.93 kg/m  02/23/16 38.74 kg/m  11/22/15 38.74 kg/m   Wt Readings from Last 4 Encounters:  08/05/16 235 lb (106.6 kg)  05/23/16 235 lb (106.6 kg)  02/23/16 240 lb (108.9 kg)  11/22/15 240 lb (108.9 kg)  Psych/Mental status: Alert, oriented x 3 (person, place, & time) Eyes: PERLA Respiratory: No evidence of acute respiratory distress  Cervical Spine Exam  Inspection: No masses, redness, or swelling Alignment: Symmetrical Functional ROM: Unrestricted ROM Stability: No instability detected Muscle strength & Tone: Functionally intact Sensory: Unimpaired Palpation: Non-contributory  Upper Extremity (UE) Exam     Side: Right upper extremity  Side: Left upper extremity  Inspection: No masses, redness, swelling, or asymmetry  Inspection: No masses, redness, swelling, or asymmetry  Functional ROM: Unrestricted ROM          Functional ROM: Unrestricted ROM          Muscle strength & Tone: Functionally intact  Muscle strength & Tone: Functionally intact  Sensory: Unimpaired  Sensory: Unimpaired  Palpation: Non-contributory  Palpation: Non-contributory   Thoracic Spine Exam  Inspection: No masses, redness, or swelling Alignment: Symmetrical Functional ROM: Unrestricted ROM Stability: No instability detected Sensory: Unimpaired Muscle strength & Tone: Functionally intact Palpation: Non-contributory  Lumbar Spine Exam  Inspection: No masses, redness, or swelling Alignment: Symmetrical Functional ROM: Unrestricted ROM Stability: No instability detected Muscle strength & Tone: Functionally intact Sensory: Unimpaired Palpation: Non-contributory Provocative Tests: Lumbar Hyperextension and rotation test: evaluation deferred today       Patrick's Maneuver: evaluation deferred today              Gait & Posture Assessment  Ambulation: Unassisted Gait: Relatively normal for age and body habitus Posture: WNL   Lower Extremity Exam    Side: Right lower extremity  Side: Left lower extremity  Inspection: No masses, redness, swelling, or asymmetry  Inspection: No masses, redness, swelling, or asymmetry  Functional ROM: Unrestricted ROM          Functional ROM: Unrestricted ROM          Muscle strength & Tone: Functionally intact  Muscle strength & Tone: Functionally intact  Sensory: Unimpaired  Sensory: Unimpaired  Palpation: Non-contributory  Palpation: Non-contributory   Assessment  Primary Diagnosis & Pertinent Problem List: The primary encounter diagnosis was Chronic knee pain  (Location of Primary Source of Pain) (Bilateral) (L>R). Diagnoses of Chronic pain syndrome, Long term current use of  opiate analgesic, and Opiate use were also pertinent to this visit.  Status Diagnosis   Stable  Stable  Stable 1. Chronic knee pain  (Location of Primary Source of Pain) (Bilateral) (L>R)   2. Chronic pain syndrome   3. Long term current use of opiate analgesic   4. Opiate use      Plan of Care  Pharmacotherapy (Medications Ordered): No orders of the defined types were placed in this encounter.  New Prescriptions   No medications on file   Medications administered today: Sophia Jackson had no medications administered during this visit. Lab-work, procedure(s), and/or referral(s): No orders of the defined types were placed in this encounter.  Imaging and/or referral(s): None  Interventional therapies: Planned, scheduled, and/or pending:   ***   Considering:   Bilateral intra-articular knee injections with Hyalgan versus genicular nerve blocks followed by radiofrequency.    Palliative PRN treatment(s):   Bilateral intra-articular knee injection with steroids versus Hyalgan.    Provider-requested follow-up: No Follow-up on file.  Future Appointments Date Time Provider [place]  11/07/2016 7:45 AM Milinda Pointer, MD [facility] None   Primary Care Physician: Stann Mainland, MD Location: [facility] Management Facility Note by: Kathlen Brunswick. Dossie Arbour, M.D, DABA, DABAPM, DABPM, DABIPP, FIPP Date: 11/07/16; Time: 2:51 AM  Pain Score Disclaimer: We use the NRS-11 scale. This is a self-reported, subjective measurement of pain severity with only modest accuracy. It is used primarily to identify changes within a particular patient. It must be understood that outpatient pain scales are significantly less accurate that those used for research, where they can be applied under ideal controlled circumstances with minimal exposure to variables. In reality, the score is likely to be a combination of pain intensity and pain affect, where pain affect describes the degree  of emotional arousal or changes in action readiness caused by the sensory experience of pain. Factors such as social and work situation, setting, emotional state, anxiety levels, expectation, and prior pain experience may influence pain perception and show large inter-individual differences that may also be affected by time variables.  Patient instructions provided during this appointment: There are no Patient Instructions on file for this visit.

## 2016-11-11 DIAGNOSIS — G43709 Chronic migraine without aura, not intractable, without status migrainosus: Secondary | ICD-10-CM | POA: Insufficient documentation

## 2016-11-11 DIAGNOSIS — G43909 Migraine, unspecified, not intractable, without status migrainosus: Secondary | ICD-10-CM | POA: Insufficient documentation

## 2016-11-11 DIAGNOSIS — IMO0002 Reserved for concepts with insufficient information to code with codable children: Secondary | ICD-10-CM | POA: Insufficient documentation

## 2016-11-12 ENCOUNTER — Ambulatory Visit: Payer: BC Managed Care – PPO | Attending: Pain Medicine | Admitting: Pain Medicine

## 2016-11-12 ENCOUNTER — Encounter: Payer: Self-pay | Admitting: Pain Medicine

## 2016-11-12 VITALS — BP 138/79 | HR 67 | Temp 97.8°F | Resp 16 | Ht 66.0 in | Wt 240.0 lb

## 2016-11-12 DIAGNOSIS — Z6839 Body mass index (BMI) 39.0-39.9, adult: Secondary | ICD-10-CM | POA: Insufficient documentation

## 2016-11-12 DIAGNOSIS — G894 Chronic pain syndrome: Secondary | ICD-10-CM | POA: Insufficient documentation

## 2016-11-12 DIAGNOSIS — F1190 Opioid use, unspecified, uncomplicated: Secondary | ICD-10-CM

## 2016-11-12 DIAGNOSIS — I10 Essential (primary) hypertension: Secondary | ICD-10-CM | POA: Diagnosis not present

## 2016-11-12 DIAGNOSIS — Z9884 Bariatric surgery status: Secondary | ICD-10-CM | POA: Diagnosis not present

## 2016-11-12 DIAGNOSIS — M159 Polyosteoarthritis, unspecified: Secondary | ICD-10-CM

## 2016-11-12 DIAGNOSIS — Z79891 Long term (current) use of opiate analgesic: Secondary | ICD-10-CM | POA: Insufficient documentation

## 2016-11-12 DIAGNOSIS — M1990 Unspecified osteoarthritis, unspecified site: Secondary | ICD-10-CM | POA: Diagnosis not present

## 2016-11-12 DIAGNOSIS — H3530 Unspecified macular degeneration: Secondary | ICD-10-CM | POA: Insufficient documentation

## 2016-11-12 DIAGNOSIS — Z5181 Encounter for therapeutic drug level monitoring: Secondary | ICD-10-CM | POA: Insufficient documentation

## 2016-11-12 DIAGNOSIS — M170 Bilateral primary osteoarthritis of knee: Secondary | ICD-10-CM | POA: Insufficient documentation

## 2016-11-12 DIAGNOSIS — Z79899 Other long term (current) drug therapy: Secondary | ICD-10-CM | POA: Diagnosis not present

## 2016-11-12 DIAGNOSIS — G8929 Other chronic pain: Secondary | ICD-10-CM

## 2016-11-12 DIAGNOSIS — M25561 Pain in right knee: Secondary | ICD-10-CM | POA: Diagnosis not present

## 2016-11-12 DIAGNOSIS — E785 Hyperlipidemia, unspecified: Secondary | ICD-10-CM | POA: Insufficient documentation

## 2016-11-12 DIAGNOSIS — M25562 Pain in left knee: Secondary | ICD-10-CM

## 2016-11-12 DIAGNOSIS — M150 Primary generalized (osteo)arthritis: Secondary | ICD-10-CM | POA: Diagnosis not present

## 2016-11-12 DIAGNOSIS — F329 Major depressive disorder, single episode, unspecified: Secondary | ICD-10-CM | POA: Diagnosis not present

## 2016-11-12 MED ORDER — HYDROCODONE-ACETAMINOPHEN 7.5-325 MG PO TABS: 1.0000 | ORAL_TABLET | Freq: Three times a day (TID) | ORAL | 0 refills | Status: DC | PRN

## 2016-11-12 NOTE — Progress Notes (Signed)
Nursing Pain Medication Assessment:  Safety precautions to be maintained throughout the outpatient stay will include: orient to surroundings, keep bed in low position, maintain call bell within reach at all times, provide assistance with transfer out of bed and ambulation.  Medication Inspection Compliance: Pill count conducted under aseptic conditions, in front of the patient. Neither the pills nor the bottle was removed from the patient's sight at any time. Once count was completed pills were immediately returned to the patient in their original bottle.  Medication: Hydrocodone/APAP Pill Count: 23 of 90 pills remain Bottle Appearance: Standard pharmacy container. Clearly labeled. Filled Date: 1212 / 18 / 2017 Medication last intake: 11/12/2016

## 2016-11-12 NOTE — Progress Notes (Signed)
Patient's Name: Sophia Jackson  MRN: [id number]  Referring Provider: Stann Mainland, MD  DOB: [birth year]  PCP: Stann Mainland, MD  DOS: 11/12/2016  Note by: Kathlen Brunswick. Dossie Arbour, MD  Service setting: Ambulatory outpatient  Specialty: Interventional Pain Management  Location: [facility] (AMB) Pain Management Facility    Patient type: Established   Primary Reason(s) for Visit: Encounter for prescription drug management (Level of risk: moderate) CC: Knee Pain (both knees)  HPI  Sophia Jackson is a 56 y.o. year old, female patient, who comes today for a medication management evaluation. She has Opiate use; Long term prescription opiate use; Long term current use of opiate analgesic; Encounter for therapeutic drug level monitoring; Chronic knee pain  (Location of Primary Source of Pain) (Bilateral) (L>R); Status post bariatric surgery; Morbid obesity ([place]); Osteoarthritis; Cataract, nuclear; Allergic rhinitis; Age-related macular degeneration; Common Migraine (Without Aura); Cornea guttata; Clinical depression; HLD (hyperlipidemia); Hypertension; Gastroduodenal ulcer (Peptic Ulcer); Cataract, post subcapsular polar senile; Opioid use agreement exists; Myofascial pain; Muscle spasms of lower extremity; GERD (gastroesophageal reflux disease); Encounter for chronic pain management; Vitamin D insufficiency; Elevated C-reactive protein (CRP); Age-related nuclear cataract of both eyes; Chronic pain syndrome; Osteoarthritis of knee (Bilateral) (L>R); and Chronic migraine on her problem list. Her primarily concern today is the Knee Pain (both knees)  Pain Assessment: Self-Reported Pain Score: 2 /10             Reported level is compatible with observation.       Pain Type: Chronic pain Pain Location: Knee Pain Orientation: Right, Left Pain Descriptors / Indicators: Aching, Sharp Pain Frequency: Constant  Sophia Jackson was last seen on 08/05/2016 for medication management. During today's appointment we reviewed Ms.  Jackson's chronic pain status, as well as her outpatient medication regimen.  The patient  reports that she does not use drugs. Her body mass index is 38.74 kg/m.  Further details on both, my assessment(s), as well as the proposed treatment plan, please see below.  Controlled Substance Pharmacotherapy Assessment REMS (Risk Evaluation and Mitigation Strategy)  Analgesic:Hydrocodone/APAP 7.5/325 one every 8 hours (22.5 mg/day of hydrocodone) MME/day:22.5 mg/day.  Evon Slack, RN  11/12/2016  8:50 AM  Sign at close encounter Nursing Pain Medication Assessment:  Safety precautions to be maintained throughout the outpatient stay will include: orient to surroundings, keep bed in low position, maintain call bell within reach at all times, provide assistance with transfer out of bed and ambulation.  Medication Inspection Compliance: Pill count conducted under aseptic conditions, in front of the patient. Neither the pills nor the bottle was removed from the patient's sight at any time. Once count was completed pills were immediately returned to the patient in their original bottle.  Medication: Hydrocodone/APAP Pill Count: 23 of 90 pills remain Bottle Appearance: Standard pharmacy container. Clearly labeled. Filled Date: 18 / 18 / 2017 Medication last intake: 11/12/2016   Pharmacokinetics: Liberation and absorption (onset of action): WNL Distribution (time to peak effect): WNL Metabolism and excretion (duration of action): WNL         Pharmacodynamics: Desired effects: Analgesia: Sophia Jackson reports >50% benefit. Functional ability: Patient reports that medication allows her to accomplish basic ADLs Clinically meaningful improvement in function (CMIF): Sustained CMIF goals met Perceived effectiveness: Described as relatively effective, allowing for increase in activities of daily living (ADL) Undesirable effects: Side-effects or Adverse reactions: None reported Monitoring: [place] PMP: Online  review of the past 58-monthperiod conducted. Compliant with practice rules and regulations List of all UDS  test(s) done:  Lab Results  Component Value Date   TOXASSSELUR FINAL 08/05/2016   TOXASSSELUR FINAL 02/23/2016   TOXASSSELUR FINAL 11/22/2015   Last UDS on record: [employer]ox[employer] Select 13  Date Value Ref Range Status  08/05/2016 FINAL  Final    Comment:    ==================================================================== TOXASSURE SELECT 13 (MW) ==================================================================== Test                             Result       Flag       Units Drug Present and Declared for Prescription Verification   Hydrocodone                    804          EXPECTED   ng/mg creat   Hydromorphone                  258          EXPECTED   ng/mg creat   Dihydrocodeine                 396          EXPECTED   ng/mg creat   Norhydrocodone                 1621         EXPECTED   ng/mg creat    Sources of hydrocodone include scheduled prescription    medications. Hydromorphone, dihydrocodeine and norhydrocodone are    expected metabolites of hydrocodone. Hydromorphone and    dihydrocodeine are also available as scheduled prescription    medications. ==================================================================== Test                      Result    Flag   Units      Ref Range   Creatinine              80               mg/dL      >=20 ==================================================================== Declared Medications:  The flagging and interpretation on this report are based on the  following declared medications.  Unexpected results may arise from  inaccuracies in the declared medications.  **Note: The testing scope of this panel includes these medications:  Hydrocodone (Norco)  **Note: The testing scope of this panel does not include following  reported medications:  Acetaminophen (Norco)  Amlodipine  Atenolol  Cholecalciferol  Cyanocobalamin   Hydrochlorothiazide (Hyzaar)  Losartan (Hyzaar)  Multivitamin  Nortriptyline (Pamelor)  Omeprazole (Prilosec)  Sertraline (Zoloft)  Sumatriptan (Imitrex)  Tizanidine (Zanaflex) ==================================================================== For clinical consultation, please call [phone]. ====================================================================    UDS interpretation: Compliant          Medication Assessment Form: Reviewed. Patient indicates being compliant with therapy Treatment compliance: Compliant Risk Assessment Profile: Aberrant behavior: See prior evaluations. None observed or detected today Comorbid factors increasing risk of overdose: See prior notes. No additional risks detected today Risk of substance use disorder (SUD): Low Opioid Risk Tool (ORT) Total Score: 1  Interpretation Table:  Score <3 = Low Risk for SUD  Score between 4-7 = Moderate Risk for SUD  Score >8 = High Risk for Opioid Abuse   Risk Mitigation Strategies:  Patient Counseling: Covered Patient-Prescriber Agreement (PPA): Present and active  Notification to other healthcare providers: Done  Pharmacologic Plan: No change in therapy, at this time  Laboratory  Chemistry  Inflammation Markers Lab Results  Component Value Date   ESRSEDRATE 43 (H) 05/23/2016   CRP 1.2 (H) 05/23/2016   Renal Function Lab Results  Component Value Date   BUN 15 05/23/2016   CREATININE 0.97 05/23/2016   GFRAA >60 05/23/2016   GFRNONAA >60 05/23/2016   Hepatic Function Lab Results  Component Value Date   AST 24 05/23/2016   ALT 18 05/23/2016   ALBUMIN 4.1 05/23/2016   Electrolytes Lab Results  Component Value Date   NA 141 05/23/2016   K 4.1 05/23/2016   CL 105 05/23/2016   CALCIUM 9.4 05/23/2016   MG 2.2 05/23/2016   Pain Modulating Vitamins Lab Results  Component Value Date   25OHVITD1 36 05/23/2016   25OHVITD2 <1.0 05/23/2016   25OHVITD3 36 05/23/2016   VITAMINB12 1,285 (H)  05/23/2016   Coagulation Parameters No results found for: INR, LABPROT, APTT, PLT Cardiovascular No results found for: BNP, HGB, HCT Note: Lab results reviewed.  Recent Diagnostic Imaging Review  No results found. Note: Imaging results reviewed.          Meds  The patient has a current medication list which includes the following prescription(s): amlodipine, atenolol, vitamin d, cyanocobalamin, hydrocodone-acetaminophen, hydrocodone-acetaminophen, hydrocodone-acetaminophen, losartan-hydrochlorothiazide, multivitamin with minerals, nortriptyline, omeprazole, sertraline, sumatriptan, tizanidine, and vitamin b-12.  Current Outpatient Prescriptions on File Prior to Visit  Medication Sig  . amLODipine (NORVASC) 5 MG tablet Take 5 mg by mouth daily.  . atenolol (TENORMIN) 25 MG tablet Take 25 mg by mouth daily.  . Cholecalciferol (VITAMIN D) 2000 UNITS tablet Take 2,000 Units by mouth daily.  . cyanocobalamin (TH VITAMIN B12) 100 MCG tablet Take by mouth.  . losartan-hydrochlorothiazide (HYZAAR) 100-25 MG tablet Take 1 tablet by mouth daily.  . omeprazole (PRILOSEC OTC) 20 MG tablet Take 20 mg by mouth daily as needed.   . SUMAtriptan (IMITREX) 100 MG tablet Take 100 mg by mouth once. as needed for Migraine (May repeat in 2 hours. Max 2/24hours.)  . tiZANidine (ZANAFLEX) 4 MG tablet 6 mg (one and a half) po qhs   No current facility-administered medications on file prior to visit.    ROS  Constitutional: Denies any fever or chills Gastrointestinal: No reported hemesis, hematochezia, vomiting, or acute GI distress Musculoskeletal: Denies any acute onset joint swelling, redness, loss of ROM, or weakness Neurological: No reported episodes of acute onset apraxia, aphasia, dysarthria, agnosia, amnesia, paralysis, loss of coordination, or loss of consciousness  Allergies  Ms. Rovira is allergic to 2,4-d dimethylamine (amisol); diphenhydramine; diphenhydramine hcl; ibuprofen; morphine and  related; nsaids; tetanus toxoids; diclofenac; morphine; naproxen; and voltaren  [diclofenac sodium].  PFSH  Drug: Ms. Tsuchiya  reports that she does not use drugs. Alcohol:  reports that she does not drink alcohol. Tobacco:  reports that she has never smoked. She has never used smokeless tobacco. Medical:  has a past medical history of Acid reflux (10/27/2007); Arthritis (07/27/2007); Avitaminosis D (10/12/2012); Cataract; Depression; Gonalgia (05/30/2011); H/O cataract extraction (01/20/2015); History of migraine (08/30/2015); Hypertension; Migraines; Osteoarthritis; and Status post bariatric surgery (08/30/2015). Family: family history includes Cancer in her mother; Heart disease in her father.  Past Surgical History:  Procedure Laterality Date  . ABDOMINAL HYSTERECTOMY    . BARIATRIC SURGERY    . KNEE SURGERY    . TONSILLECTOMY    . WRIST SURGERY     Constitutional Exam  General appearance: Well nourished, well developed, and well hydrated. In no apparent acute distress Vitals:   11/12/16 0835    BP: 138/79  Pulse: 67  Resp: 16  Temp: 97.8 F (36.6 C)  SpO2: 100%  Weight: 240 lb (108.9 kg)  Height: 5' 6" (1.676 m)   BMI Assessment: Estimated body mass index is 38.74 kg/m as calculated from the following:   Height as of this encounter: 5' 6" (1.676 m).   Weight as of this encounter: 240 lb (108.9 kg).  BMI interpretation table: BMI level Category Range association with higher incidence of chronic pain  <18 kg/m2 Underweight   18.5-24.9 kg/m2 Ideal body weight   25-29.9 kg/m2 Overweight Increased incidence by 20%  30-34.9 kg/m2 Obese (Class I) Increased incidence by 68%  35-39.9 kg/m2 Severe obesity (Class II) Increased incidence by 136%  >40 kg/m2 Extreme obesity (Class III) Increased incidence by 254%   BMI Readings from Last 4 Encounters:  11/12/16 38.74 kg/m  08/05/16 37.93 kg/m  05/23/16 37.93 kg/m  02/23/16 38.74 kg/m   Wt Readings from Last 4 Encounters:   11/12/16 240 lb (108.9 kg)  08/05/16 235 lb (106.6 kg)  05/23/16 235 lb (106.6 kg)  02/23/16 240 lb (108.9 kg)  Psych/Mental status: Alert, oriented x 3 (person, place, & time) Eyes: PERLA Respiratory: No evidence of acute respiratory distress  Cervical Spine Exam  Inspection: No masses, redness, or swelling Alignment: Symmetrical Functional ROM: Unrestricted ROM Stability: No instability detected Muscle strength & Tone: Functionally intact Sensory: Unimpaired Palpation: Non-contributory  Upper Extremity (UE) Exam    Side: Right upper extremity  Side: Left upper extremity  Inspection: No masses, redness, swelling, or asymmetry  Inspection: No masses, redness, swelling, or asymmetry  Functional ROM: Unrestricted ROM          Functional ROM: Unrestricted ROM          Muscle strength & Tone: Functionally intact  Muscle strength & Tone: Functionally intact  Sensory: Unimpaired  Sensory: Unimpaired  Palpation: Non-contributory  Palpation: Non-contributory   Thoracic Spine Exam  Inspection: No masses, redness, or swelling Alignment: Symmetrical Functional ROM: Unrestricted ROM Stability: No instability detected Sensory: Unimpaired Muscle strength & Tone: Functionally intact Palpation: Non-contributory  Lumbar Spine Exam  Inspection: No masses, redness, or swelling Alignment: Symmetrical Functional ROM: Unrestricted ROM Stability: No instability detected Muscle strength & Tone: Functionally intact Sensory: Unimpaired Palpation: Non-contributory Provocative Tests: Lumbar Hyperextension and rotation test: evaluation deferred today       Patrick's Maneuver: evaluation deferred today              Gait & Posture Assessment  Ambulation: Unassisted Gait: Relatively normal for age and body habitus Posture: WNL   Lower Extremity Exam    Side: Right lower extremity  Side: Left lower extremity  Inspection: No masses, redness, swelling, or asymmetry  Inspection: No masses,  redness, swelling, or asymmetry  Functional ROM: Unrestricted ROM          Functional ROM: Unrestricted ROM          Muscle strength & Tone: Functionally intact  Muscle strength & Tone: Functionally intact  Sensory: Unimpaired  Sensory: Unimpaired  Palpation: Non-contributory  Palpation: Non-contributory   Assessment  Primary Diagnosis & Pertinent Problem List: The primary encounter diagnosis was Chronic pain syndrome. Diagnoses of Chronic knee pain  (Location of Primary Source of Pain) (Bilateral) (L>R), Osteoarthritis of knee (Bilateral) (L>R), Osteoarthritis, Long term current use of opiate analgesic, and Opiate use were also pertinent to this visit.  Status Diagnosis  Stable Unimproved Unimproved 1. Chronic pain syndrome   2. Chronic knee pain  (Location  of Primary Source of Pain) (Bilateral) (L>R)   3. Osteoarthritis of knee (Bilateral) (L>R)   4. Osteoarthritis   5. Long term current use of opiate analgesic   6. Opiate use      Plan of Care  Pharmacotherapy (Medications Ordered): Meds ordered this encounter  Medications  . HYDROcodone-acetaminophen (NORCO) 7.5-325 MG tablet    Sig: Take 1 tablet by mouth every 8 (eight) hours as needed for severe pain.    Dispense:  90 tablet    Refill:  0    Do not place this medication, or any other prescription from our practice, on "Automatic Refill". Patient may have prescription filled one day early if pharmacy is closed on scheduled refill date. Do not fill until: 11/19/16 To last until: 12/19/16  . HYDROcodone-acetaminophen (NORCO) 7.5-325 MG tablet    Sig: Take 1 tablet by mouth every 8 (eight) hours as needed for severe pain.    Dispense:  90 tablet    Refill:  0    Do not place this medication, or any other prescription from our practice, on "Automatic Refill". Patient may have prescription filled one day early if pharmacy is closed on scheduled refill date. Do not fill until: 01/18/17 To last until: 02/17/17  .  HYDROcodone-acetaminophen (NORCO) 7.5-325 MG tablet    Sig: Take 1 tablet by mouth every 8 (eight) hours as needed for severe pain.    Dispense:  90 tablet    Refill:  0    Do not place this medication, or any other prescription from our practice, on "Automatic Refill". Patient may have prescription filled one day early if pharmacy is closed on scheduled refill date. Do not fill until: 12/19/16 To last until: 01/18/17   New Prescriptions   No medications on file   Medications administered today: Ms. Rodell had no medications administered during this visit. Lab-work, procedure(s), and/or referral(s): Orders Placed This Encounter  Procedures  . KNEE INJECTION   Imaging and/or referral(s): None  Interventional therapies: Planned, scheduled, and/or pending:   None at this time    Considering:   Bilateral intra-articular Hyalgan knee injections. Bilateral intra-articular knee injections with Hyalgan versus genicular nerve blocks followed by radiofrequency.    Palliative PRN treatment(s):   Bilateral intra-articular knee injection with steroids versus Hyalgan.    Provider-requested follow-up: Return in about 3 months (around 02/10/2017) for (NP) Med-Mgmt, in addition, (PRN) procedure: Hyalgan knee injections.  No future appointments. Primary Care Physician: Zachary W Sandbulte, MD Location: [facility] Note by:  A. , M.D, DABA, DABAPM, DABPM, DABIPP, FIPP Date: 11/12/16; Time: 5:04 PM  Pain Score Disclaimer: We use the NRS-11 scale. This is a self-reported, subjective measurement of pain severity with only modest accuracy. It is used primarily to identify changes within a particular patient. It must be understood that outpatient pain scales are significantly less accurate that those used for research, where they can be applied under ideal controlled circumstances with minimal exposure to variables. In reality, the score is likely to be a  combination of pain intensity and pain affect, where pain affect describes the degree of emotional arousal or changes in action readiness caused by the sensory experience of pain. Factors such as social and work situation, setting, emotional state, anxiety levels, expectation, and prior pain experience may influence pain perception and show large inter-individual differences that may also be affected by time variables.  Patient instructions provided during this appointment: There are no Patient Instructions on file for this   visit.

## 2016-12-02 ENCOUNTER — Telehealth: Payer: Self-pay | Admitting: *Deleted

## 2016-12-16 ENCOUNTER — Encounter: Payer: BC Managed Care – PPO | Admitting: Pain Medicine

## 2017-02-20 ENCOUNTER — Ambulatory Visit: Payer: BC Managed Care – PPO | Attending: Nurse Practitioner | Admitting: Nurse Practitioner

## 2017-02-20 ENCOUNTER — Encounter: Payer: Self-pay | Admitting: Nurse Practitioner

## 2017-02-20 VITALS — BP 155/74 | HR 71 | Temp 98.7°F | Resp 16 | Ht 66.0 in | Wt 235.0 lb

## 2017-02-20 DIAGNOSIS — E559 Vitamin D deficiency, unspecified: Secondary | ICD-10-CM | POA: Insufficient documentation

## 2017-02-20 DIAGNOSIS — Z8249 Family history of ischemic heart disease and other diseases of the circulatory system: Secondary | ICD-10-CM | POA: Insufficient documentation

## 2017-02-20 DIAGNOSIS — G894 Chronic pain syndrome: Secondary | ICD-10-CM | POA: Diagnosis not present

## 2017-02-20 DIAGNOSIS — Z79891 Long term (current) use of opiate analgesic: Secondary | ICD-10-CM | POA: Diagnosis not present

## 2017-02-20 DIAGNOSIS — K219 Gastro-esophageal reflux disease without esophagitis: Secondary | ICD-10-CM | POA: Diagnosis not present

## 2017-02-20 DIAGNOSIS — E785 Hyperlipidemia, unspecified: Secondary | ICD-10-CM | POA: Diagnosis not present

## 2017-02-20 DIAGNOSIS — Z885 Allergy status to narcotic agent status: Secondary | ICD-10-CM | POA: Insufficient documentation

## 2017-02-20 DIAGNOSIS — Z888 Allergy status to other drugs, medicaments and biological substances status: Secondary | ICD-10-CM | POA: Diagnosis not present

## 2017-02-20 DIAGNOSIS — H2513 Age-related nuclear cataract, bilateral: Secondary | ICD-10-CM | POA: Diagnosis not present

## 2017-02-20 DIAGNOSIS — Z886 Allergy status to analgesic agent status: Secondary | ICD-10-CM | POA: Diagnosis not present

## 2017-02-20 DIAGNOSIS — M25562 Pain in left knee: Secondary | ICD-10-CM | POA: Insufficient documentation

## 2017-02-20 DIAGNOSIS — G8929 Other chronic pain: Secondary | ICD-10-CM | POA: Diagnosis not present

## 2017-02-20 DIAGNOSIS — Z887 Allergy status to serum and vaccine status: Secondary | ICD-10-CM | POA: Insufficient documentation

## 2017-02-20 DIAGNOSIS — Z90710 Acquired absence of both cervix and uterus: Secondary | ICD-10-CM | POA: Insufficient documentation

## 2017-02-20 DIAGNOSIS — G43909 Migraine, unspecified, not intractable, without status migrainosus: Secondary | ICD-10-CM | POA: Insufficient documentation

## 2017-02-20 DIAGNOSIS — Z809 Family history of malignant neoplasm, unspecified: Secondary | ICD-10-CM | POA: Diagnosis not present

## 2017-02-20 DIAGNOSIS — F329 Major depressive disorder, single episode, unspecified: Secondary | ICD-10-CM | POA: Insufficient documentation

## 2017-02-20 DIAGNOSIS — Z9884 Bariatric surgery status: Secondary | ICD-10-CM | POA: Diagnosis not present

## 2017-02-20 DIAGNOSIS — Z9889 Other specified postprocedural states: Secondary | ICD-10-CM | POA: Diagnosis not present

## 2017-02-20 DIAGNOSIS — M170 Bilateral primary osteoarthritis of knee: Secondary | ICD-10-CM | POA: Diagnosis not present

## 2017-02-20 DIAGNOSIS — M25561 Pain in right knee: Secondary | ICD-10-CM | POA: Insufficient documentation

## 2017-02-20 DIAGNOSIS — Z6837 Body mass index (BMI) 37.0-37.9, adult: Secondary | ICD-10-CM | POA: Diagnosis not present

## 2017-02-20 DIAGNOSIS — I10 Essential (primary) hypertension: Secondary | ICD-10-CM | POA: Diagnosis not present

## 2017-02-20 DIAGNOSIS — M62838 Other muscle spasm: Secondary | ICD-10-CM | POA: Insufficient documentation

## 2017-02-20 MED ORDER — HYDROCODONE-ACETAMINOPHEN 7.5-325 MG PO TABS: 1.0000 | ORAL_TABLET | Freq: Three times a day (TID) | ORAL | 0 refills | Status: DC | PRN

## 2017-02-20 NOTE — Progress Notes (Signed)
Patient's Name: Sophia Jackson  MRN: [id number]  Referring Provider: Stann Mainland, MD  DOB: 10/09/1961  PCP: Stann Mainland, MD  DOS: 02/20/2017  Note by: Vevelyn Francois NP  Service setting: Ambulatory outpatient  Specialty: Interventional Pain Management  Location: [facility] (AMB) Pain Management Facility    Patient type: Established    Primary Reason(s) for Visit: Encounter for prescription drug management (Level of risk: moderate) CC: Knee Pain (bilaterally)  HPI  Sophia Jackson is a 56 y.o. year old, female patient, who comes today for a medication management evaluation. She has Opiate use; Long term prescription opiate use; Long term current use of opiate analgesic; Encounter for therapeutic drug level monitoring; Chronic knee pain  (Location of Primary Source of Pain) (Bilateral) (L>R); Status post bariatric surgery; Morbid obesity ([place]); Osteoarthritis; Cataract, nuclear; Allergic rhinitis; Age-related macular degeneration; Common Migraine (Without Aura); Cornea guttata; Clinical depression; HLD (hyperlipidemia); Hypertension; Gastroduodenal ulcer (Peptic Ulcer); Cataract, post subcapsular polar senile; Opioid use agreement exists; Myofascial pain; Muscle spasms of lower extremity; GERD (gastroesophageal reflux disease); Encounter for chronic pain management; Vitamin D insufficiency; Elevated C-reactive protein (CRP); Age-related nuclear cataract of both eyes; Chronic pain syndrome; Osteoarthritis of knee (Bilateral) (L>R); and Chronic migraine on her problem list. Her primarily concern today is the Knee Pain (bilaterally)  Pain Assessment: Self-Reported Pain Score: 3 /10             Reported level is compatible with observation.       Pain Type: Chronic pain Pain Location: Knee Pain Orientation: Right, Left Pain Descriptors / Indicators: Sharp Pain Frequency: Constant  Sophia Jackson is for medication management. During today's appointment we reviewed Sophia Jackson's chronic pain status, as  well as her outpatient medication regimen. She has bilateral knee pain with the left being worse than the right . She admits that she needs bilatearel knee replaements. She is trying to defer this as long as possible. She has swelling but she uses ice regularly . She has failed Volteran and lidoderm patches in the past. She admits that the narcotics take the edge off. She does some numbneess and tingling occasionally but does not feel like it is realted to her knees. She has weakness in her knees. She suffered a fall Easter however this was related to misjudgment not knee weakness. She does try to stay active with her dog.   The patient  reports that she does not use drugs. Her body mass index is 37.93 kg/m.  Further details on both, my assessment(s), as well as the proposed treatment plan, please see below.  Controlled Substance Pharmacotherapy Assessment REMS (Risk Evaluation and Mitigation Strategy)  Analgesic:Hydrocodone/APAP 7.5/325 one every 8 hours (22.5 mg/day of hydrocodone) MME/day:22.5 mg/day. Janett Billow, RN  02/20/2017  9:27 AM  Sign at close encounter Nursing Pain Medication Assessment:  Safety precautions to be maintained throughout the outpatient stay will include: orient to surroundings, keep bed in low position, maintain call bell within reach at all times, provide assistance with transfer out of bed and ambulation.  Medication Inspection Compliance: Sophia Jackson did not comply with our request to bring her pills to be counted. She was reminded that bringing the medication bottles, even when empty, is a requirement. Pill/Patch Count: None available to be counted. Bottle Appearance: No container available. Did not bring bottle(s) to appointment. Medication: None brought in. Filled Date: N/A Last Medication intake:  Ran out of medicine more than 48 hours ago   Pharmacokinetics: Liberation and absorption (  onset of action): WNL Distribution (time to peak effect):  WNL Metabolism and excretion (duration of action): WNL         Pharmacodynamics: Desired effects: Analgesia: Sophia Jackson reports >50% benefit. Functional ability: Patient reports that medication allows her to accomplish basic ADLs Clinically meaningful improvement in function (CMIF): Sustained CMIF goals met Perceived effectiveness: Described as relatively effective, allowing for increase in activities of daily living (ADL) Undesirable effects: Side-effects or Adverse reactions: None reported Monitoring: [place] PMP: Online review of the past 50-monthperiod conducted. Compliant with practice rules and regulations List of all UDS test(s) done:  Lab Results  Component Value Date   TOXASSSELUR FINAL 08/05/2016   TOXASSSELUR FINAL 02/23/2016   TOXASSSELUR FINAL 11/22/2015   Last UDS on record: ToxAssure Select 13  Date Value Ref Range Status  08/05/2016 FINAL  Final    Comment:    ==================================================================== TOXASSURE SELECT 13 (MW) ==================================================================== Test                             Result       Flag       Units Drug Present and Declared for Prescription Verification   Hydrocodone                    804          EXPECTED   ng/mg creat   Hydromorphone                  258          EXPECTED   ng/mg creat   Dihydrocodeine                 396          EXPECTED   ng/mg creat   Norhydrocodone                 1621         EXPECTED   ng/mg creat    Sources of hydrocodone include scheduled prescription    medications. Hydromorphone, dihydrocodeine and norhydrocodone are    expected metabolites of hydrocodone. Hydromorphone and    dihydrocodeine are also available as scheduled prescription    medications. ==================================================================== Test                      Result    Flag   Units      Ref Range   Creatinine              80               mg/dL       >=20 ==================================================================== Declared Medications:  The flagging and interpretation on this report are based on the  following declared medications.  Unexpected results may arise from  inaccuracies in the declared medications.  **Note: The testing scope of this panel includes these medications:  Hydrocodone (Norco)  **Note: The testing scope of this panel does not include following  reported medications:  Acetaminophen (Norco)  Amlodipine  Atenolol  Cholecalciferol  Cyanocobalamin  Hydrochlorothiazide (Hyzaar)  Losartan (Hyzaar)  Multivitamin  Nortriptyline (Pamelor)  Omeprazole (Prilosec)  Sertraline (Zoloft)  Sumatriptan (Imitrex)  Tizanidine (Zanaflex) ==================================================================== For clinical consultation, please call [phone] ====================================================================    UDS interpretation: Compliant          Medication Assessment Form: Reviewed. Patient indicates being compliant with therapy Treatment compliance: Compliant Risk Assessment Profile:  Aberrant behavior: See prior evaluations. None observed or detected today Comorbid factors increasing risk of overdose: See prior notes. No additional risks detected today Risk of substance use disorder (SUD): Low Opioid Risk Tool (ORT) Total Score: 3  Interpretation Table:  Score <3 = Low Risk for SUD  Score between 4-7 = Moderate Risk for SUD  Score >8 = High Risk for Opioid Abuse   Risk Mitigation Strategies:  Patient Counseling: Covered Patient-Prescriber Agreement (PPA): Present and active  Notification to other healthcare providers: Done  Pharmacologic Plan: No change in therapy, at this time  Laboratory Chemistry  Inflammation Markers Lab Results  Component Value Date   CRP 1.2 (H) 05/23/2016   ESRSEDRATE 43 (H) 05/23/2016   (CRP: Acute Phase) (ESR: Chronic Phase) Renal Function  Markers Lab Results  Component Value Date   BUN 15 05/23/2016   CREATININE 0.97 05/23/2016   GFRAA >60 05/23/2016   GFRNONAA >60 05/23/2016   Hepatic Function Markers Lab Results  Component Value Date   AST 24 05/23/2016   ALT 18 05/23/2016   ALBUMIN 4.1 05/23/2016   ALKPHOS 124 05/23/2016   Electrolytes Lab Results  Component Value Date   NA 141 05/23/2016   K 4.1 05/23/2016   CL 105 05/23/2016   CALCIUM 9.4 05/23/2016   MG 2.2 05/23/2016   Neuropathy Markers Lab Results  Component Value Date   VITAMINB12 1,285 (H) 05/23/2016   Bone Pathology Markers Lab Results  Component Value Date   ALKPHOS 124 05/23/2016   25OHVITD1 36 05/23/2016   25OHVITD2 <1.0 05/23/2016   25OHVITD3 36 05/23/2016   CALCIUM 9.4 05/23/2016   Coagulation Parameters No results found for: INR, LABPROT, APTT, PLT Cardiovascular Markers No results found for: BNP, HGB, HCT Note: Lab results reviewed.  Recent Diagnostic Imaging Review  No results found. Note: Imaging results reviewed.          Meds  The patient has a current medication list which includes the following prescription(s): amlodipine, atenolol, vitamin d, cyanocobalamin, losartan-hydrochlorothiazide, multivitamin with minerals, nortriptyline, omeprazole, sertraline, sumatriptan, tizanidine, vitamin b-12, hydrocodone-acetaminophen, hydrocodone-acetaminophen, and hydrocodone-acetaminophen.  Current Outpatient Prescriptions on File Prior to Visit  Medication Sig  . amLODipine (NORVASC) 5 MG tablet Take 5 mg by mouth daily.  Marland Kitchen atenolol (TENORMIN) 25 MG tablet Take 25 mg by mouth daily.  . Cholecalciferol (VITAMIN D) 2000 UNITS tablet Take 2,000 Units by mouth daily.  . cyanocobalamin (TH VITAMIN B12) 100 MCG tablet Take by mouth.  . losartan-hydrochlorothiazide (HYZAAR) 100-25 MG tablet Take 1 tablet by mouth daily.  . Multiple Vitamins-Minerals (MULTIVITAMIN WITH MINERALS) tablet Take 1 tablet by mouth at bedtime.   . nortriptyline  (PAMELOR) 50 MG capsule Take 50 mg by mouth at bedtime.   Marland Kitchen omeprazole (PRILOSEC OTC) 20 MG tablet Take 20 mg by mouth daily as needed.   . sertraline (ZOLOFT) 100 MG tablet Take one and 1/2 tablets (150 mg) by mouth daily  . SUMAtriptan (IMITREX) 100 MG tablet Take 100 mg by mouth once. as needed for Migraine (May repeat in 2 hours. Max 2/24hours.)  . tiZANidine (ZANAFLEX) 4 MG tablet 6 mg (one and a half) po qhs  . vitamin B-12 (CYANOCOBALAMIN) 100 MCG tablet Take 500 mcg by mouth at bedtime.    No current facility-administered medications on file prior to visit.    ROS  Constitutional: Denies any fever or chills Gastrointestinal: No reported hemesis, hematochezia, vomiting, or acute GI distress Musculoskeletal: Denies any acute onset joint swelling, redness, loss of ROM,  or weakness Neurological: No reported episodes of acute onset apraxia, aphasia, dysarthria, agnosia, amnesia, paralysis, loss of coordination, or loss of consciousness  Allergies  Sophia Jackson is allergic to 2,4-d dimethylamine (amisol); diphenhydramine; diphenhydramine hcl; ibuprofen; morphine and related; nsaids; tetanus toxoids; diclofenac; morphine; naproxen; and voltaren  [diclofenac sodium].  [place]  Drug: Sophia Jackson  reports that she does not use drugs. Alcohol:  reports that she does not drink alcohol. Tobacco:  reports that she has never smoked. She has never used smokeless tobacco. Medical:  has a past medical history of Acid reflux (10/27/2007); Arthritis (07/27/2007); Avitaminosis D (10/12/2012); Cataract; Depression; Gonalgia (05/30/2011); H/O cataract extraction (01/20/2015); History of migraine (08/30/2015); Hypertension; Migraines; Osteoarthritis; and Status post bariatric surgery (08/30/2015). Family: family history includes Cancer in her mother; Heart disease in her father.  Past Surgical History:  Procedure Laterality Date  . ABDOMINAL HYSTERECTOMY    . BARIATRIC SURGERY    . KNEE SURGERY    .  TONSILLECTOMY    . WRIST SURGERY     Constitutional Exam  General appearance: Well nourished, well developed, and well hydrated. In no apparent acute distress Vitals:   02/20/17 0926  BP: (!) 155/74  Pulse: 71  Resp: 16  Temp: 98.7 F (37.1 C)  TempSrc: Oral  SpO2: 100%  Weight: 235 lb (106.6 kg)  Height: _0  (1.676 m)   BMI Assessment: Estimated body mass index is 37.93 kg/m as calculated from the following:   Height as of this encounter: _1  (1.676 m).   Weight as of this encounter: 235 lb (106.6 kg).  BMI interpretation table: BMI level Category Range association with higher incidence of chronic pain  <18 kg/m2 Underweight   18.5-24.9 kg/m2 Ideal body weight   25-29.9 kg/m2 Overweight Increased incidence by 20%  30-34.9 kg/m2 Obese (Class I) Increased incidence by 68%  35-39.9 kg/m2 Severe obesity (Class II) Increased incidence by 136%  >40 kg/m2 Extreme obesity (Class III) Increased incidence by 254%   BMI Readings from Last 4 Encounters:  02/20/17 37.93 kg/m  11/12/16 38.74 kg/m  08/05/16 37.93 kg/m  05/23/16 37.93 kg/m   Wt Readings from Last 4 Encounters:  02/20/17 235 lb (106.6 kg)  11/12/16 240 lb (108.9 kg)  08/05/16 235 lb (106.6 kg)  05/23/16 235 lb (106.6 kg)  Psych/Mental status: Alert, oriented x 3 (person, place, & time)       Eyes: PERLA Respiratory: No evidence of acute respiratory distress  Cervical Spine Exam  Inspection: No masses, redness, or swelling Alignment: Symmetrical Functional ROM: Unrestricted ROM Stability: No instability detected Muscle strength & Tone: Functionally intact Sensory: Unimpaired Palpation: No palpable anomalies              Upper Extremity (UE) Exam    Side: Right upper extremity  Side: Left upper extremity  Inspection: No masses, redness, swelling, or asymmetry. No contractures  Inspection: No masses, redness, swelling, or asymmetry. No contractures  Functional ROM: Unrestricted ROM          Functional  ROM: Unrestricted ROM          Muscle strength & Tone: Functionally intact  Muscle strength & Tone: Functionally intact  Sensory: Unimpaired  Sensory: Unimpaired  Palpation: No palpable anomalies              Palpation: No palpable anomalies              Specialized Test(s): Deferred         Specialized Test(s): Deferred  Thoracic Spine Exam  Inspection: No masses, redness, or swelling Alignment: Symmetrical Functional ROM: Unrestricted ROM Stability: No instability detected Sensory: Unimpaired Muscle strength & Tone: No palpable anomalies  Lumbar Spine Exam  Inspection: No masses, redness, or swelling Alignment: Symmetrical Functional ROM: Unrestricted ROM Stability: No instability detected Muscle strength & Tone: Functionally intact Sensory: Unimpaired Palpation: No palpable anomalies Provocative Tests: Lumbar Hyperextension and rotation test: evaluation deferred today       Patrick's Maneuver: evaluation deferred today              Gait & Posture Assessment  Ambulation: Unassisted Gait: Relatively normal for age and body habitus Posture: WNL   Lower Extremity Exam    Side: Right lower extremity, knee  Side: Left lower extremity, knee  Inspection: No masses, redness, swelling, or asymmetry. No contractures  Inspection: No masses, redness, swelling, or asymmetry. No contractures  Functional ROM: Unrestricted ROM          Functional ROM: Unrestricted ROM        , grinding  Muscle strength & Tone: Normal strength (5/5)  Muscle strength & Tone: Normal strength (5/5)  Sensory: Unimpaired  Sensory: Unimpaired  Palpation: Uncomfortable, crepitus  Palpation: Uncomfortable, crepitus   Assessment  Primary Diagnosis & Pertinent Problem List: The primary encounter diagnosis was Chronic knee pain  (Location of Primary Source of Pain) (Bilateral) (L>R). Diagnoses of Osteoarthritis of knee (Bilateral) (L>R), Muscle spasms of lower extremity, unspecified laterality, Chronic pain  syndrome, and Long term current use of opiate analgesic were also pertinent to this visit.  Status Diagnosis  Controlled Controlled Controlled 1. Chronic knee pain  (Location of Primary Source of Pain) (Bilateral) (L>R)   2. Osteoarthritis of knee (Bilateral) (L>R)   3. Muscle spasms of lower extremity, unspecified laterality   4. Chronic pain syndrome   5. Long term current use of opiate analgesic      Plan of Care  Pharmacotherapy (Medications Ordered): Meds ordered this encounter  Medications  . HYDROcodone-acetaminophen (NORCO) 7.5-325 MG tablet    Sig: Take 1 tablet by mouth every 8 (eight) hours as needed for severe pain.    Dispense:  90 tablet    Refill:  0    Do not place this medication, or any other prescription from our practice, on "Automatic Refill". Patient may have prescription filled one day early if pharmacy is closed on scheduled refill date. Do not fill until: 02/20/17 To last until: 03/22/17    Order Specific Question:   Supervising Provider    Answer:   Milinda Pointer [phone]  . HYDROcodone-acetaminophen (NORCO) 7.5-325 MG tablet    Sig: Take 1 tablet by mouth every 8 (eight) hours as needed for severe pain.    Dispense:  90 tablet    Refill:  0    Do not place this medication, or any other prescription from our practice, on "Automatic Refill". Patient may have prescription filled one day early if pharmacy is closed on scheduled refill date. Do not fill until: 04/22/17 To last until: 05/21/17    Order Specific Question:   Supervising Provider    Answer:   Milinda Pointer [phone]  . HYDROcodone-acetaminophen (NORCO) 7.5-325 MG tablet    Sig: Take 1 tablet by mouth every 8 (eight) hours as needed for severe pain.    Dispense:  90 tablet    Refill:  0    Do not place this medication, or any other prescription from our practice, on "Automatic Refill". Patient may have prescription  filled one day early if pharmacy is closed on scheduled refill date. Do  not fill until: 05/21/17 To last until: 06/20/17    Order Specific Question:   Supervising Provider    Answer:   Milinda Pointer [phone]   New Prescriptions   No medications on file   Medications administered today: Ms. Guerin had no medications administered during this visit. Lab-work, procedure(s), and/or referral(s): Orders Placed This Encounter  Procedures  . ToxASSURE Select 13 (MW), Urine   Imaging and/or referral(s): None  Interventional therapies: Planned, scheduled, and/or pending:   Not at this time.   Considering:   Bilateral intra-articular Hyalgan knee injections. Bilateral intra-articular knee injections with Hyalgan versus genicular nerve blocks followed by radiofrequency.    Palliative PRN treatment(s):   Bilateral intra-articular knee injection with steroids versus Hyalgan.    Provider-requested follow-up: Return in about 3 months (around 05/22/2017) for Medication Mgmt.  Future Appointments Date Time Provider [place]  05/22/2017 8:15 AM [place], NP [facility] None   Primary Care Physician: Stann Mainland, MD Location: [facility] Facility Note by: Vevelyn Francois NP Date: 02/20/2017; Time: 10:24 AM  Pain Score Disclaimer: We use the NRS-11 scale. This is a self-reported, subjective measurement of pain severity with only modest accuracy. It is used primarily to identify changes within a particular patient. It must be understood that outpatient pain scales are significantly less accurate that those used for research, where they can be applied under ideal controlled circumstances with minimal exposure to variables. In reality, the score is likely to be a combination of pain intensity and pain affect, where pain affect describes the degree of emotional arousal or changes in action readiness caused by the sensory experience of pain. Factors such as social and work situation, setting, emotional state, anxiety levels,  expectation, and prior pain experience may influence pain perception and show large inter-individual differences that may also be affected by time variables.  Patient instructions provided during this appointment: There are no Patient Instructions on file for this visit.

## 2017-02-20 NOTE — Progress Notes (Signed)
Nursing Pain Medication Assessment:  Safety precautions to be maintained throughout the outpatient stay will include: orient to surroundings, keep bed in low position, maintain call bell within reach at all times, provide assistance with transfer out of bed and ambulation.  Medication Inspection Compliance: Sophia Jackson did not comply with our request to bring her pills to be counted. She was reminded that bringing the medication bottles, even when empty, is a requirement. Pill/Patch Count: None available to be counted. Bottle Appearance: No container available. Did not bring bottle(s) to appointment. Medication: None brought in. Filled Date: N/A Last Medication intake:  Ran out of medicine more than 48 hours ago

## 2017-02-21 ENCOUNTER — Telehealth: Payer: Self-pay | Admitting: Pain Medicine

## 2017-02-21 NOTE — Telephone Encounter (Signed)
Voicemail left that there was an error with Rx's and that we need to get the July Rx back and replace with a May Rx.  Will also call pharmacy to make them aware.   Called back to pharmacy to let them know that I have contacted patient to let her know that we will need to get the July Rx and replace with May Rx.

## 2017-02-21 NOTE — Telephone Encounter (Signed)
Patient called and states that she does not think she should have to take off work to come in and exchange prescriptions.  Informed patient that we would talk to Crystal on Monday and call her back regarding prescriptions.

## 2017-02-21 NOTE — Telephone Encounter (Signed)
[facility] lvmail Thur 02-20-17 at 5:12 stating they have scripts for [place] for April and June and July but do not have a script for May, should they change do not fill on the July script to May ? Please call pharmacy and let them know

## 2017-02-24 MED ORDER — HYDROCODONE-ACETAMINOPHEN 7.5-325 MG PO TABS: 1.0000 | ORAL_TABLET | Freq: Three times a day (TID) | ORAL | 0 refills | Status: DC | PRN

## 2017-02-24 NOTE — Telephone Encounter (Signed)
Left voicemail with patient that Rx for May will be left at front desk with Olegario Messier and she would need to bring the July Rx to exchange.

## 2017-02-27 LAB — TOXASSURE SELECT 13 (MW), URINE

## 2017-05-21 NOTE — Progress Notes (Signed)
Patient's Name: Sophia Jackson  MRN: [id number]  Referring Provider: Stann Mainland, MD  DOB: April 29, 1961  PCP: Stann Mainland, MD  DOS: 05/22/2017  Note by: Vevelyn Francois NP  Service setting: Ambulatory outpatient  Specialty: Interventional Pain Management  Location: [facility] (AMB) Pain Management Facility    Patient type: Established    Primary Reason(s) for Visit: Encounter for prescription drug management. (Level of risk: moderate)  CC: Joint Pain (hips, knees,elbows, shoulders) and Pain (head)  HPI  Sophia Jackson is a 56 y.o. year old, female patient, who comes today for a medication management evaluation. She has Opiate use; Long term prescription opiate use; Long term current use of opiate analgesic; Encounter for therapeutic drug level monitoring; Knee joint pain; Status post bariatric surgery; Morbid obesity ([place]); Osteoarthritis; Cataract, nuclear; Allergic rhinitis; AMD (age related macular degeneration); Common migraine; Cornea guttata; Depression; Hyperlipidemia; Hypertension; Peptic ulcer; Posterior subcapsular polar age-related cataract of both eyes; Opioid use agreement exists; Myofascial pain; Muscle spasms of lower extremity; Esophageal reflux; Encounter for chronic pain management; Vitamin D deficiency; Elevated C-reactive protein (CRP); Age-related nuclear cataract of both eyes; Chronic pain syndrome; Osteoarthritis of knee (Bilateral) (L>R); Chronic migraine; Gastric bypass status for obesity; Impaired fasting glucose; Iron deficiency anemia; Osteoarthritis of both knees; Status post cataract extraction and insertion of intraocular lens of left eye; and Status post cataract extraction and insertion of intraocular lens of right eye on her problem list. Her primarily concern today is the Joint Pain (hips, knees,elbows, shoulders) and Pain (head)  Pain Assessment: Location: Other (Comment) (all joints)  (joint pain all over) Radiating:   Onset: More than a month ago Duration:  Chronic pain Quality: Aching, Sharp Severity: 4 /10 (self-reported pain score)  Note: Reported level is compatible with observation.                   Effect on ADL:   Timing: Constant Modifying factors: medications, ice, rest  Sophia Jackson was last scheduled for an appointment on 02/20/2017 for medication management. During today's appointment we reviewed Sophia Jackson's chronic pain status, as well as her outpatient medication regimen. She admits that her pain is stable and generalized. She admits that the joints are worse. She denies any numbness, tingling or weakness in her extremities. She denies any recent falls or injuries. She continues to maintain a full time job in [place]. She does admits that the increased walking causes her joint pain to fluctuate.    The patient  reports that she does not use drugs. Her body mass index is 38.41 kg/m.  Further details on both, my assessment(s), as well as the proposed treatment plan, please see below.  Controlled Substance Pharmacotherapy Assessment REMS (Risk Evaluation and Mitigation Strategy)  Analgesic:Hydrocodone/APAP 7.5/325 one every 8 hours (22.5 mg/day of hydrocodone) MME/day:22.5 mg/day. Hart Rochester, RN  05/22/2017  8:16 AM  Sign at close encounter Nursing Pain Medication Assessment:  Safety precautions to be maintained throughout the outpatient stay will include: orient to surroundings, keep bed in low position, maintain call bell within reach at all times, provide assistance with transfer out of bed and ambulation.  Medication Inspection Compliance: Pill count conducted under aseptic conditions, in front of the patient. Neither the pills nor the bottle was removed from the patient's sight at any time. Once count was completed pills were immediately returned to the patient in their original bottle.  Medication: Hydrocodone/APAP Pill/Patch Count: 0 of 90 pills remain Pill/Patch Appearance: Markings consistent with prescribed  medication Bottle Appearance: Standard pharmacy container. Clearly labeled. Filled Date: 06 / 18 / 2018 Last Medication intake:  Yesterday   Pharmacokinetics: Liberation and absorption (onset of action): WNL Distribution (time to peak effect): WNL Metabolism and excretion (duration of action): WNL         Pharmacodynamics: Desired effects: Analgesia: Sophia Jackson reports >50% benefit. Functional ability: Patient reports that medication allows her to accomplish basic ADLs Clinically meaningful improvement in function (CMIF): Sustained CMIF goals met Perceived effectiveness: Described as relatively effective, allowing for increase in activities of daily living (ADL) Undesirable effects: Side-effects or Adverse reactions: None reported Monitoring: [place] PMP: Online review of the past 31-monthperiod conducted. Compliant with practice rules and regulations List of all UDS test(s) done:  Lab Results  Component Value Date   TOXASSSELUR FINAL 08/05/2016   TOXASSSELUR FINAL 02/23/2016   THigdenFINAL 11/22/2015   SUMMARY FINAL 02/20/2017   Last UDS on record: [employer] Select 13  Date Value Ref Range Status  08/05/2016 FINAL  Final    Comment:    ==================================================================== TOXASSURE SELECT 13 (MW) ==================================================================== Test                             Result       Flag       Units Drug Present and Declared for Prescription Verification   Hydrocodone                    804          EXPECTED   ng/mg creat   Hydromorphone                  258          EXPECTED   ng/mg creat   Dihydrocodeine                 396          EXPECTED   ng/mg creat   Norhydrocodone                 1621         EXPECTED   ng/mg creat    Sources of hydrocodone include scheduled prescription    medications. Hydromorphone, dihydrocodeine and norhydrocodone are    expected metabolites of hydrocodone. Hydromorphone and     dihydrocodeine are also available as scheduled prescription    medications. ==================================================================== Test                      Result    Flag   Units      Ref Range   Creatinine              80               mg/dL      >=20 ==================================================================== Declared Medications:  The flagging and interpretation on this report are based on the  following declared medications.  Unexpected results may arise from  inaccuracies in the declared medications.  **Note: The testing scope of this panel includes these medications:  Hydrocodone (Norco)  **Note: The testing scope of this panel does not include following  reported medications:  Acetaminophen (Norco)  Amlodipine  Atenolol  Cholecalciferol  Cyanocobalamin  Hydrochlorothiazide (Hyzaar)  Losartan (Hyzaar)  Multivitamin  Nortriptyline (Pamelor)  Omeprazole (Prilosec)  Sertraline (Zoloft)  Sumatriptan (Imitrex)  Tizanidine (Zanaflex) ==================================================================== For clinical consultation, please call [phone] ====================================================================  Summary  Date Value Ref Range Status  02/20/2017 FINAL  Final    Comment:    ==================================================================== TOXASSURE SELECT 13 (MW) ==================================================================== Test                             Result       Flag       Units Drug Present and Declared for Prescription Verification   Hydrocodone                    375          EXPECTED   ng/mg creat   Norhydrocodone                 765          EXPECTED   ng/mg creat    Sources of hydrocodone include scheduled prescription    medications. Norhydrocodone is an expected metabolite of    hydrocodone. ==================================================================== Test                      Result     Flag   Units      Ref Range   Creatinine              20               mg/dL      >=20 ==================================================================== Declared Medications:  The flagging and interpretation on this report are based on the  following declared medications.  Unexpected results may arise from  inaccuracies in the declared medications.  **Note: The testing scope of this panel includes these medications:  Hydrocodone (Hydrocodone-Acetaminophen)  **Note: The testing scope of this panel does not include following  reported medications:  Acetaminophen (Hydrocodone-Acetaminophen)  Amlodipine  Atenolol  Cholecalciferol  Cyanocobalamin  Hydrochlorothiazide (Losartan-HCTZ)  Losartan (Losartan-HCTZ)  Multivitamin (MVI)  Nortriptyline  Omeprazole  Sertraline  Sumatriptan  Tizanidine ==================================================================== For clinical consultation, please call [phone]. ====================================================================    UDS interpretation: Compliant          Medication Assessment Form: Reviewed. Patient indicates being compliant with therapy Treatment compliance: Compliant Risk Assessment Profile: Aberrant behavior: See prior evaluations. None observed or detected today Comorbid factors increasing risk of overdose: See prior notes. No additional risks detected today Risk of substance use disorder (SUD): Low Opioid Risk Tool (ORT) Total Score: 3  Interpretation Table:  Score <3 = Low Risk for SUD  Score between 4-7 = Moderate Risk for SUD  Score >8 = High Risk for Opioid Abuse   Risk Mitigation Strategies:  Patient Counseling: Covered Patient-Prescriber Agreement (PPA): Present and active  Notification to other healthcare providers: Done  Pharmacologic Plan: No change in therapy, at this time  Laboratory Chemistry  Inflammation Markers (CRP: Acute Phase) (ESR: Chronic Phase) Lab Results  Component Value  Date   CRP 1.2 (H) 05/23/2016   ESRSEDRATE 43 (H) 05/23/2016                 Renal Function Markers Lab Results  Component Value Date   BUN 15 05/23/2016   CREATININE 0.97 05/23/2016   GFRAA >60 05/23/2016   GFRNONAA >60 05/23/2016                 Hepatic Function Markers Lab Results  Component Value Date   AST 24 05/23/2016   ALT 18 05/23/2016   ALBUMIN 4.1 05/23/2016   ALKPHOS 124 05/23/2016  Electrolytes Lab Results  Component Value Date   NA 141 05/23/2016   K 4.1 05/23/2016   CL 105 05/23/2016   CALCIUM 9.4 05/23/2016   MG 2.2 05/23/2016                 Neuropathy Markers Lab Results  Component Value Date   VITAMINB12 1,285 (H) 05/23/2016                 Bone Pathology Markers Lab Results  Component Value Date   ALKPHOS 124 05/23/2016   25OHVITD1 36 05/23/2016   25OHVITD2 <1.0 05/23/2016   25OHVITD3 36 05/23/2016   CALCIUM 9.4 05/23/2016                 Coagulation Parameters No results found for: INR, LABPROT, APTT, PLT               Cardiovascular Markers No results found for: BNP, HGB, HCT               Note: Lab results reviewed.  Recent Diagnostic Imaging Review  No results found. Note: Imaging results reviewed.          Meds   Current Meds  Medication Sig  . amLODipine (NORVASC) 5 MG tablet Take 5 mg by mouth daily.  Marland Kitchen atenolol (TENORMIN) 25 MG tablet Take 25 mg by mouth daily.  . Cholecalciferol (VITAMIN D) 2000 UNITS tablet Take 2,000 Units by mouth daily.  Marland Kitchen losartan-hydrochlorothiazide (HYZAAR) 100-25 MG tablet Take 1 tablet by mouth daily.  . Multiple Vitamins-Minerals (MULTIVITAMIN WITH MINERALS) tablet Take 1 tablet by mouth at bedtime.   . nortriptyline (PAMELOR) 50 MG capsule Take 50 mg by mouth at bedtime.   Marland Kitchen omeprazole (PRILOSEC OTC) 20 MG tablet Take 20 mg by mouth daily as needed.   . sertraline (ZOLOFT) 100 MG tablet Take one and 1/2 tablets (150 mg) by mouth daily  . SUMAtriptan (IMITREX) 100 MG tablet  Take 100 mg by mouth once. as needed for Migraine (May repeat in 2 hours. Max 2/24hours.)  . tiZANidine (ZANAFLEX) 4 MG tablet 6 mg (one and a half) po qhs  . vitamin B-12 (CYANOCOBALAMIN) 100 MCG tablet Take 500 mcg by mouth at bedtime.     ROS  Constitutional: Denies any fever or chills Gastrointestinal: No reported hemesis, hematochezia, vomiting, or acute GI distress Musculoskeletal: Denies any acute onset joint swelling, redness, loss of ROM, or weakness Neurological: No reported episodes of acute onset apraxia, aphasia, dysarthria, agnosia, amnesia, paralysis, loss of coordination, or loss of consciousness  Allergies  Sophia Jackson is allergic to 2,4-d dimethylamine (amisol); diphenhydramine; diphenhydramine hcl; ibuprofen; morphine and related; nsaids; tetanus toxoids; diclofenac; morphine; naproxen; and voltaren  [diclofenac sodium].  [place]  Drug: Sophia Jackson  reports that she does not use drugs. Alcohol:  reports that she does not drink alcohol. Tobacco:  reports that she has never smoked. She has never used smokeless tobacco. Medical:  has a past medical history of Acid reflux (10/27/2007); Arthritis (07/27/2007); Avitaminosis D (10/12/2012); Cataract; Depression; Gonalgia (05/30/2011); H/O cataract extraction (01/20/2015); History of migraine (08/30/2015); Hypertension; Migraines; Osteoarthritis; and Status post bariatric surgery (08/30/2015). Surgical: Sophia Jackson  has a past surgical history that includes Abdominal hysterectomy; Bariatric Surgery; Wrist surgery; Knee surgery; and Tonsillectomy. Family: family history includes Cancer in her mother; Heart disease in her father.  Constitutional Exam  General appearance: Well nourished, well developed, and well hydrated. In no apparent acute distress Vitals:   05/22/17 0806  BP: Marland Kitchen)  154/77  Pulse: 66  Resp: 18  Temp: 98.3 F (36.8 C)  TempSrc: Oral  SpO2: 100%  Weight: 238 lb (108 kg)  Height: '5\' 6"'  (1.676 m)   BMI Assessment:  Estimated body mass index is 38.41 kg/m as calculated from the following:   Height as of this encounter: '5\' 6"'  (1.676 m).   Weight as of this encounter: 238 lb (108 kg).  BMI interpretation table: BMI level Category Range association with higher incidence of chronic pain  <18 kg/m2 Underweight   18.5-24.9 kg/m2 Ideal body weight   25-29.9 kg/m2 Overweight Increased incidence by 20%  30-34.9 kg/m2 Obese (Class I) Increased incidence by 68%  35-39.9 kg/m2 Severe obesity (Class II) Increased incidence by 136%  >40 kg/m2 Extreme obesity (Class III) Increased incidence by 254%   BMI Readings from Last 4 Encounters:  05/22/17 38.41 kg/m  02/20/17 37.93 kg/m  11/12/16 38.74 kg/m  08/05/16 37.93 kg/m   Wt Readings from Last 4 Encounters:  05/22/17 238 lb (108 kg)  02/20/17 235 lb (106.6 kg)  11/12/16 240 lb (108.9 kg)  08/05/16 235 lb (106.6 kg)  Psych/Mental status: Alert, oriented x 3 (person, place, & time)       Eyes: PERLA Respiratory: No evidence of acute respiratory distress  Cervical Spine Exam  Inspection: No masses, redness, or swelling Alignment: Symmetrical Functional ROM: Unrestricted ROM      Stability: No instability detected Muscle strength & Tone: Functionally intact Sensory: Unimpaired Palpation: No palpable anomalies              Upper Extremity (UE) Exam    Side: Right upper extremity  Side: Left upper extremity  Inspection: No masses, redness, swelling, or asymmetry. No contractures  Inspection: No masses, redness, swelling, or asymmetry. No contractures  Functional ROM: Unrestricted ROM          Functional ROM: Unrestricted ROM          Muscle strength & Tone: Functionally intact  Muscle strength & Tone: Functionally intact  Sensory: Unimpaired  Sensory: Unimpaired  Palpation: No palpable anomalies              Palpation: No palpable anomalies              Specialized Test(s): Deferred         Specialized Test(s): Deferred          Thoracic Spine Exam   Inspection: No masses, redness, or swelling Alignment: Symmetrical Functional ROM: Unrestricted ROM Stability: No instability detected Sensory: Unimpaired Muscle strength & Tone: No palpable anomalies  Lumbar Spine Exam  Inspection: No masses, redness, or swelling Alignment: Symmetrical Functional ROM: Unrestricted ROM      Stability: No instability detected Muscle strength & Tone: Functionally intact Sensory: Unimpaired Palpation: No palpable anomalies       Provocative Tests: Lumbar Hyperextension and rotation test: evaluation deferred today       Lumbar Lateral bending test: evaluation deferred today       Patrick's Maneuver: evaluation deferred today                    Gait & Posture Assessment  Ambulation: Unassisted Gait: Relatively normal for age and body habitus Posture: WNL   Lower Extremity Exam    Side: Right lower extremity  Side: Left lower extremity  Inspection: No masses, redness, swelling, or asymmetry. No contractures  Inspection: No masses, redness, swelling, or asymmetry. No contractures  Functional ROM: Unrestricted ROM  Functional ROM: Unrestricted ROM          Muscle strength & Tone: Functionally intact  Muscle strength & Tone: Functionally intact  Sensory: Unimpaired  Sensory: Unimpaired  Palpation: No palpable anomalies  Palpation: No palpable anomalies   Assessment  Primary Diagnosis & Pertinent Problem List: The encounter diagnosis was Chronic pain syndrome.  Status Diagnosis  Controlled Controlled Controlled 1. Chronic pain syndrome     Problems updated and reviewed during this visit: No problems updated. Plan of Care  Pharmacotherapy (Medications Ordered): Meds ordered this encounter  Medications  . HYDROcodone-acetaminophen (NORCO) 7.5-325 MG tablet    Sig: Take 1 tablet by mouth every 8 (eight) hours as needed for severe pain.    Dispense:  90 tablet    Refill:  0    Do not place this medication, or any other prescription  from our practice, on "Automatic Refill". Patient may have prescription filled one day early if pharmacy is closed on scheduled refill date. Do not fill until: 05/22/2017 To last until: 06/21/2017    Order Specific Question:   Supervising Provider    Answer:   Milinda Pointer [phone]  . HYDROcodone-acetaminophen (NORCO) 7.5-325 MG tablet    Sig: Take 1 tablet by mouth every 8 (eight) hours as needed for severe pain.    Dispense:  90 tablet    Refill:  0    Do not place this medication, or any other prescription from our practice, on "Automatic Refill". Patient may have prescription filled one day early if pharmacy is closed on scheduled refill date. Do not fill until: 06/21/2017 To last until: 07/21/2017    Order Specific Question:   Supervising Provider    Answer:   Milinda Pointer [phone]  . HYDROcodone-acetaminophen (NORCO) 7.5-325 MG tablet    Sig: Take 1 tablet by mouth every 8 (eight) hours as needed for severe pain.    Dispense:  90 tablet    Refill:  0    Do not place this medication, or any other prescription from our practice, on "Automatic Refill". Patient may have prescription filled one day early if pharmacy is closed on scheduled refill date. Do not fill until: 07/21/2017 To last until: 08/20/2017    Order Specific Question:   Supervising Provider    Answer:   Milinda Pointer [phone]   New Prescriptions   No medications on file   Medications administered today: Sophia Jackson had no medications administered during this visit. Lab-work, procedure(s), and/or referral(s): No orders of the defined types were placed in this encounter.  Imaging and/or referral(s): None  Interventional therapies: Planned, scheduled, and/or pending:   None at this time    Considering:   Bilateral intra-articular Hyalgan knee injections. Bilateral intra-articular knee injections with Hyalgan versus genicular nerve blocks followed by radiofrequency.    Palliative PRN treatment(s):    Bilateral intra-articular knee injection with steroids versus Hyalgan.    Provider-requested follow-up: Return in about 3 months (around 08/22/2017) for MedMgmt.  Future Appointments Date Time Provider [place]  08/18/2017 8:30 AM Vevelyn Francois, NP [facility] None   Primary Care Physician: Stann Mainland, MD Location: [facility] Note by: Vevelyn Francois NP Date: 05/22/2017; Time: 10:25 AM  Pain Score Disclaimer: We use the NRS-11 scale. This is a self-reported, subjective measurement of pain severity with only modest accuracy. It is used primarily to identify changes within a particular patient. It must be understood that outpatient pain scales are significantly less accurate that  those used for research, where they can be applied under ideal controlled circumstances with minimal exposure to variables. In reality, the score is likely to be a combination of pain intensity and pain affect, where pain affect describes the degree of emotional arousal or changes in action readiness caused by the sensory experience of pain. Factors such as social and work situation, setting, emotional state, anxiety levels, expectation, and prior pain experience may influence pain perception and show large inter-individual differences that may also be affected by time variables.  Patient instructions provided during this appointment: Patient Instructions   ____________________________________________________________________________________________  Medication Rules  Applies to: All patients receiving prescriptions (written or electronic).  Pharmacy of record: Pharmacy where electronic prescriptions will be sent. If written prescriptions are taken to a different pharmacy, please inform the nursing staff. The pharmacy listed in the electronic medical record should be the one where you would like electronic prescriptions to be sent.  Prescription refills: Only  during scheduled appointments. Applies to both, written and electronic prescriptions.  NOTE: The following applies primarily to controlled substances (Opioid* Pain Medications).   Patient's responsibilities: 1. Pain Pills: Bring all pain pills to every appointment (except for procedure appointments). 2. Pill Bottles: Bring pills in original pharmacy bottle. Always bring newest bottle. Bring bottle, even if empty. 3. Medication refills: You are responsible for knowing and keeping track of what medications you need refilled. The day before your appointment, write a list of all prescriptions that need to be refilled. Bring that list to your appointment and give it to the admitting nurse. Prescriptions will be written only during appointments. If you forget a medication, it will not be "Called in", "Faxed", or "electronically sent". You will need to get another appointment to get these prescribed. 4. Prescription Accuracy: You are responsible for carefully inspecting your prescriptions before leaving our office. Have the discharge nurse carefully go over each prescription with you, before taking them home. Make sure that your name is accurately spelled, that your address is correct. Check the name and dose of your medication to make sure it is accurate. Check the number of pills, and the written instructions to make sure they are clear and accurate. Make sure that you are given enough medication to last until your next medication refill appointment. 5. Taking Medication: Take medication as prescribed. Never take more pills than instructed. Never take medication more frequently than prescribed. Taking less pills or less frequently is permitted and encouraged, when it comes to controlled substances (written prescriptions).  6. Inform other Doctors: Always inform, all of your healthcare providers, of all the medications you take. 7. Pain Medication from other Providers: You are not allowed to accept any  additional pain medication from any other Doctor or Healthcare provider. There are two exceptions to this rule. (see below) In the event that you require additional pain medication, you are responsible for notifying us, as stated below. 8. Medication Agreement: You are responsible for carefully reading and following our Medication Agreement. This must be signed before receiving any prescriptions from our practice. Safely store a copy of your signed Agreement. Violations to the Agreement will result in no further prescriptions. (Additional copies of our Medication Agreement are available upon request.) 9. Laws, Rules, & Regulations: All patients are expected to follow all Federal and [employer], [employer], Rules, [employer]. Ignorance of the Laws does not constitute a valid excuse. The use of any illegal substances is prohibited. 10. Adopted CDC guidelines & recommendations: [employer] dosing levels will  be at or below 60 MME/day. Use of benzodiazepines** is not recommended.  Exceptions: There are only two exceptions to the rule of not receiving pain medications from other Healthcare Providers. 1. Exception #1 (Emergencies): In the event of an emergency (i.e.: accident requiring emergency care), you are allowed to receive additional pain medication. However, you are responsible for: As soon as you are able, call our office [phone], at any time of the day or night, and leave a message stating your name, the date and nature of the emergency, and the name and dose of the medication prescribed. In the event that your call is answered by a member of our staff, make sure to document and save the date, time, and the name of the person that took your information.  2. Exception #2 (Planned Surgery): In the event that you are scheduled by another doctor or dentist to have any type of surgery or procedure, you are allowed (for a period no longer than 30 days), to receive additional pain medication, for the acute  post-op pain. However, in this case, you are responsible for picking up a copy of our "Post-op Pain Management for Surgeons" handout, and giving it to your surgeon or dentist. This document is available at our office, and does not require an appointment to obtain it. Simply go to our office during business hours (Monday-Thursday from 8:00 AM to 4:00 PM) (Friday 8:00 AM to 12:00 Noon) or if you have a scheduled appointment with [place], prior to your surgery, and ask for it by name. In addition, you will need to provide [place] with your name, name of your surgeon, type of surgery, and date of procedure or surgery.  *Opioid medications include: morphine, codeine, oxycodone, oxymorphone, hydrocodone, hydromorphone, meperidine, tramadol, tapentadol, buprenorphine, fentanyl, methadone. **Benzodiazepine medications include: diazepam (Valium), alprazolam (Xanax), clonazepam (Klonopine), lorazepam (Ativan), clorazepate (Tranxene), chlordiazepoxide (Librium), estazolam (Prosom), oxazepam (Serax), temazepam (Restoril), triazolam (Halcion)  ____________________________________________________________________________________________

## 2017-05-22 ENCOUNTER — Ambulatory Visit: Payer: BC Managed Care – PPO | Attending: Nurse Practitioner | Admitting: Nurse Practitioner

## 2017-05-22 ENCOUNTER — Encounter: Payer: Self-pay | Admitting: Nurse Practitioner

## 2017-05-22 DIAGNOSIS — M25521 Pain in right elbow: Secondary | ICD-10-CM | POA: Insufficient documentation

## 2017-05-22 DIAGNOSIS — M25522 Pain in left elbow: Secondary | ICD-10-CM | POA: Diagnosis not present

## 2017-05-22 DIAGNOSIS — Z5181 Encounter for therapeutic drug level monitoring: Secondary | ICD-10-CM | POA: Diagnosis not present

## 2017-05-22 DIAGNOSIS — Z9884 Bariatric surgery status: Secondary | ICD-10-CM | POA: Insufficient documentation

## 2017-05-22 DIAGNOSIS — M25552 Pain in left hip: Secondary | ICD-10-CM | POA: Diagnosis not present

## 2017-05-22 DIAGNOSIS — M170 Bilateral primary osteoarthritis of knee: Secondary | ICD-10-CM | POA: Diagnosis not present

## 2017-05-22 DIAGNOSIS — M25511 Pain in right shoulder: Secondary | ICD-10-CM | POA: Insufficient documentation

## 2017-05-22 DIAGNOSIS — K219 Gastro-esophageal reflux disease without esophagitis: Secondary | ICD-10-CM | POA: Diagnosis not present

## 2017-05-22 DIAGNOSIS — J309 Allergic rhinitis, unspecified: Secondary | ICD-10-CM | POA: Diagnosis not present

## 2017-05-22 DIAGNOSIS — E559 Vitamin D deficiency, unspecified: Secondary | ICD-10-CM | POA: Insufficient documentation

## 2017-05-22 DIAGNOSIS — M25512 Pain in left shoulder: Secondary | ICD-10-CM | POA: Diagnosis not present

## 2017-05-22 DIAGNOSIS — G894 Chronic pain syndrome: Secondary | ICD-10-CM

## 2017-05-22 DIAGNOSIS — Z886 Allergy status to analgesic agent status: Secondary | ICD-10-CM | POA: Insufficient documentation

## 2017-05-22 DIAGNOSIS — H3530 Unspecified macular degeneration: Secondary | ICD-10-CM | POA: Insufficient documentation

## 2017-05-22 DIAGNOSIS — F329 Major depressive disorder, single episode, unspecified: Secondary | ICD-10-CM | POA: Insufficient documentation

## 2017-05-22 DIAGNOSIS — I10 Essential (primary) hypertension: Secondary | ICD-10-CM | POA: Diagnosis not present

## 2017-05-22 DIAGNOSIS — Z79899 Other long term (current) drug therapy: Secondary | ICD-10-CM | POA: Diagnosis not present

## 2017-05-22 DIAGNOSIS — Z6838 Body mass index (BMI) 38.0-38.9, adult: Secondary | ICD-10-CM | POA: Insufficient documentation

## 2017-05-22 DIAGNOSIS — Z79891 Long term (current) use of opiate analgesic: Secondary | ICD-10-CM | POA: Diagnosis not present

## 2017-05-22 DIAGNOSIS — E785 Hyperlipidemia, unspecified: Secondary | ICD-10-CM | POA: Diagnosis not present

## 2017-05-22 DIAGNOSIS — D509 Iron deficiency anemia, unspecified: Secondary | ICD-10-CM | POA: Insufficient documentation

## 2017-05-22 DIAGNOSIS — M25551 Pain in right hip: Secondary | ICD-10-CM | POA: Diagnosis not present

## 2017-05-22 DIAGNOSIS — R7982 Elevated C-reactive protein (CRP): Secondary | ICD-10-CM | POA: Insufficient documentation

## 2017-05-22 DIAGNOSIS — Z885 Allergy status to narcotic agent status: Secondary | ICD-10-CM | POA: Diagnosis not present

## 2017-05-22 MED ORDER — HYDROCODONE-ACETAMINOPHEN 7.5-325 MG PO TABS: 1.0000 | ORAL_TABLET | Freq: Three times a day (TID) | ORAL | 0 refills | Status: DC | PRN

## 2017-05-22 NOTE — Patient Instructions (Signed)

## 2017-05-22 NOTE — Progress Notes (Signed)
Nursing Pain Medication Assessment:  Safety precautions to be maintained throughout the outpatient stay will include: orient to surroundings, keep bed in low position, maintain call bell within reach at all times, provide assistance with transfer out of bed and ambulation.  Medication Inspection Compliance: Pill count conducted under aseptic conditions, in front of the patient. Neither the pills nor the bottle was removed from the patient's sight at any time. Once count was completed pills were immediately returned to the patient in their original bottle.  Medication: Hydrocodone/APAP Pill/Patch Count: 0 of 90 pills remain Pill/Patch Appearance: Markings consistent with prescribed medication Bottle Appearance: Standard pharmacy container. Clearly labeled. Filled Date: 06 / 18 / 2018 Last Medication intake:  Yesterday

## 2017-07-09 ENCOUNTER — Telehealth: Payer: Self-pay | Admitting: *Deleted

## 2017-08-18 ENCOUNTER — Encounter: Payer: Self-pay | Admitting: Nurse Practitioner

## 2017-08-18 ENCOUNTER — Ambulatory Visit: Payer: BC Managed Care – PPO | Attending: Nurse Practitioner | Admitting: Nurse Practitioner

## 2017-08-18 VITALS — BP 117/69 | HR 63 | Temp 98.5°F | Resp 16 | Ht 66.0 in | Wt 240.0 lb

## 2017-08-18 DIAGNOSIS — M25569 Pain in unspecified knee: Secondary | ICD-10-CM

## 2017-08-18 DIAGNOSIS — F329 Major depressive disorder, single episode, unspecified: Secondary | ICD-10-CM | POA: Diagnosis not present

## 2017-08-18 DIAGNOSIS — Z885 Allergy status to narcotic agent status: Secondary | ICD-10-CM | POA: Insufficient documentation

## 2017-08-18 DIAGNOSIS — Z90710 Acquired absence of both cervix and uterus: Secondary | ICD-10-CM | POA: Diagnosis not present

## 2017-08-18 DIAGNOSIS — Z79899 Other long term (current) drug therapy: Secondary | ICD-10-CM | POA: Insufficient documentation

## 2017-08-18 DIAGNOSIS — Z79891 Long term (current) use of opiate analgesic: Secondary | ICD-10-CM

## 2017-08-18 DIAGNOSIS — M25561 Pain in right knee: Secondary | ICD-10-CM | POA: Diagnosis not present

## 2017-08-18 DIAGNOSIS — Z5181 Encounter for therapeutic drug level monitoring: Secondary | ICD-10-CM | POA: Diagnosis present

## 2017-08-18 DIAGNOSIS — Z9889 Other specified postprocedural states: Secondary | ICD-10-CM | POA: Insufficient documentation

## 2017-08-18 DIAGNOSIS — R7982 Elevated C-reactive protein (CRP): Secondary | ICD-10-CM | POA: Diagnosis not present

## 2017-08-18 DIAGNOSIS — Z8249 Family history of ischemic heart disease and other diseases of the circulatory system: Secondary | ICD-10-CM | POA: Insufficient documentation

## 2017-08-18 DIAGNOSIS — E559 Vitamin D deficiency, unspecified: Secondary | ICD-10-CM | POA: Insufficient documentation

## 2017-08-18 DIAGNOSIS — Z9884 Bariatric surgery status: Secondary | ICD-10-CM | POA: Diagnosis not present

## 2017-08-18 DIAGNOSIS — M170 Bilateral primary osteoarthritis of knee: Secondary | ICD-10-CM | POA: Diagnosis not present

## 2017-08-18 DIAGNOSIS — D509 Iron deficiency anemia, unspecified: Secondary | ICD-10-CM | POA: Diagnosis not present

## 2017-08-18 DIAGNOSIS — G894 Chronic pain syndrome: Secondary | ICD-10-CM | POA: Diagnosis not present

## 2017-08-18 DIAGNOSIS — M2550 Pain in unspecified joint: Secondary | ICD-10-CM | POA: Insufficient documentation

## 2017-08-18 DIAGNOSIS — G8929 Other chronic pain: Secondary | ICD-10-CM

## 2017-08-18 DIAGNOSIS — Z809 Family history of malignant neoplasm, unspecified: Secondary | ICD-10-CM | POA: Insufficient documentation

## 2017-08-18 DIAGNOSIS — Z886 Allergy status to analgesic agent status: Secondary | ICD-10-CM | POA: Diagnosis not present

## 2017-08-18 DIAGNOSIS — J309 Allergic rhinitis, unspecified: Secondary | ICD-10-CM | POA: Diagnosis not present

## 2017-08-18 DIAGNOSIS — M25562 Pain in left knee: Secondary | ICD-10-CM

## 2017-08-18 DIAGNOSIS — E785 Hyperlipidemia, unspecified: Secondary | ICD-10-CM | POA: Insufficient documentation

## 2017-08-18 DIAGNOSIS — Z789 Other specified health status: Secondary | ICD-10-CM

## 2017-08-18 DIAGNOSIS — K219 Gastro-esophageal reflux disease without esophagitis: Secondary | ICD-10-CM | POA: Diagnosis not present

## 2017-08-18 DIAGNOSIS — H3530 Unspecified macular degeneration: Secondary | ICD-10-CM | POA: Diagnosis not present

## 2017-08-18 DIAGNOSIS — I10 Essential (primary) hypertension: Secondary | ICD-10-CM | POA: Diagnosis not present

## 2017-08-18 MED ORDER — HYDROCODONE-ACETAMINOPHEN 7.5-325 MG PO TABS: 1.0000 | ORAL_TABLET | Freq: Three times a day (TID) | ORAL | 0 refills | Status: DC | PRN

## 2017-08-18 NOTE — Patient Instructions (Addendum)
____________________________________________________________________________________________  Medication Rules  Applies to: All patients receiving prescriptions (written or electronic).  Pharmacy of record: Pharmacy where electronic prescriptions will be sent. If written prescriptions are taken to a different pharmacy, please inform the nursing staff. The pharmacy listed in the electronic medical record should be the one where you would like electronic prescriptions to be sent.  Prescription refills: Only during scheduled appointments. Applies to both, written and electronic prescriptions.  NOTE: The following applies primarily to controlled substances (Opioid* Pain Medications).   Patient's responsibilities: 1. Pain Pills: Bring all pain pills to every appointment (except for procedure appointments). 2. Pill Bottles: Bring pills in original pharmacy bottle. Always bring newest bottle. Bring bottle, even if empty. 3. Medication refills: You are responsible for knowing and keeping track of what medications you need refilled. The day before your appointment, write a list of all prescriptions that need to be refilled. Bring that list to your appointment and give it to the admitting nurse. Prescriptions will be written only during appointments. If you forget a medication, it will not be "Called in", "Faxed", or "electronically sent". You will need to get another appointment to get these prescribed. 4. Prescription Accuracy: You are responsible for carefully inspecting your prescriptions before leaving our office. Have the discharge nurse carefully go over each prescription with you, before taking them home. Make sure that your name is accurately spelled, that your address is correct. Check the name and dose of your medication to make sure it is accurate. Check the number of pills, and the written instructions to make sure they are clear and accurate. Make sure that you are given enough medication to  last until your next medication refill appointment. 5. Taking Medication: Take medication as prescribed. Never take more pills than instructed. Never take medication more frequently than prescribed. Taking less pills or less frequently is permitted and encouraged, when it comes to controlled substances (written prescriptions).  6. Inform other Doctors: Always inform, all of your healthcare providers, of all the medications you take. 7. Pain Medication from other Providers: You are not allowed to accept any additional pain medication from any other Doctor or Healthcare provider. There are two exceptions to this rule. (see below) In the event that you require additional pain medication, you are responsible for notifying us, as stated below. 8. Medication Agreement: You are responsible for carefully reading and following our Medication Agreement. This must be signed before receiving any prescriptions from our practice. Safely store a copy of your signed Agreement. Violations to the Agreement will result in no further prescriptions. (Additional copies of our Medication Agreement are available upon request.) 9. Laws, Rules, & Regulations: All patients are expected to follow all Federal and State Laws, Statutes, Rules, & Regulations. Ignorance of the Laws does not constitute a valid excuse. The use of any illegal substances is prohibited. 10. Adopted CDC guidelines & recommendations: Target dosing levels will be at or below 60 MME/day. Use of benzodiazepines** is not recommended.  Exceptions: There are only two exceptions to the rule of not receiving pain medications from other Healthcare Providers. 1. Exception #1 (Emergencies): In the event of an emergency (i.e.: accident requiring emergency care), you are allowed to receive additional pain medication. However, you are responsible for: As soon as you are able, call our office [phone], at any time of the day or night, and leave a message stating your  name, the date and nature of the emergency, and the name and dose of the medication   prescribed. In the event that your call is answered by a member of our staff, make sure to document and save the date, time, and the name of the person that took your information.  2. Exception #2 (Planned Surgery): In the event that you are scheduled by another doctor or dentist to have any type of surgery or procedure, you are allowed (for a period no longer than 30 days), to receive additional pain medication, for the acute post-op pain. However, in this case, you are responsible for picking up a copy of our "Post-op Pain Management for Surgeons" handout, and giving it to your surgeon or dentist. This document is available at our office, and does not require an appointment to obtain it. Simply go to our office during business hours (Monday-Thursday from 8:00 AM to 4:00 PM) (Friday 8:00 AM to 12:00 Noon) or if you have a scheduled appointment with [place], prior to your surgery, and ask for it by name. In addition, you will need to provide [place] with your name, name of your surgeon, type of surgery, and date of procedure or surgery.  *Opioid medications include: morphine, codeine, oxycodone, oxymorphone, hydrocodone, hydromorphone, meperidine, tramadol, tapentadol, buprenorphine, fentanyl, methadone. **Benzodiazepine medications include: diazepam (Valium), alprazolam (Xanax), clonazepam (Klonopine), lorazepam (Ativan), clorazepate (Tranxene), chlordiazepoxide (Librium), estazolam (Prosom), oxazepam (Serax), temazepam (Restoril), triazolam (Halcion)  ____________________________________________________________________________________________  BMI Assessment: Estimated body mass index is 38.74 kg/m as calculated from the following:   Height as of this encounter:  (1.676 m).   Weight as of this encounter: 240 lb (108.9 kg).  BMI interpretation table: BMI level Category Range association with higher incidence of chronic pain   <18 kg/m2 Underweight   18.5-24.9 kg/m2 Ideal body weight   25-29.9 kg/m2 Overweight Increased incidence by 20%  30-34.9 kg/m2 Obese (Class I) Increased incidence by 68%  35-39.9 kg/m2 Severe obesity (Class II) Increased incidence by 136%  >40 kg/m2 Extreme obesity (Class III) Increased incidence by 254%   BMI Readings from Last 4 Encounters:  08/18/17 38.74 kg/m  05/22/17 38.41 kg/m  02/20/17 37.93 kg/m  11/12/16 38.74 kg/m   Wt Readings from Last 4 Encounters:  08/18/17 240 lb (108.9 kg)  05/22/17 238 lb (108 kg)  02/20/17 235 lb (106.6 kg)  11/12/16 240 lb (108.9 kg)

## 2017-08-18 NOTE — Progress Notes (Signed)
Nursing Pain Medication Assessment:  Safety precautions to be maintained throughout the outpatient stay will include: orient to surroundings, keep bed in low position, maintain call bell within reach at all times, provide assistance with transfer out of bed and ambulation.  Medication Inspection Compliance: Pill count conducted under aseptic conditions, in front of the patient. Neither the pills nor the bottle was removed from the patient's sight at any time. Once count was completed pills were immediately returned to the patient in their original bottle.  Medication: Hydrocodone/APAP Pill/Patch Count: 17 of 90 pills remain Pill/Patch Appearance: Markings consistent with prescribed medication Bottle Appearance: Standard pharmacy container. Clearly labeled. Filled Date: 09 / 20 / 2018 Last Medication intake:  Yesterday

## 2017-08-18 NOTE — Progress Notes (Signed)
Patient's Name: Sophia Jackson  MRN: [id number]  Referring Provider: Stann Mainland, MD  DOB: [birth year]  PCP: Stann Mainland, MD  DOS: 08/18/2017  Note by: Vevelyn Francois NP  Service setting: Ambulatory outpatient  Specialty: Interventional Pain Management  Location: [facility] (AMB) Pain Management Facility    Patient type: Established    Primary Reason(s) for Visit: Encounter for prescription drug management. (Level of risk: moderate)  CC: Knee Pain (bilateral)  HPI  Sophia Jackson is a 56 y.o. year old, female patient, who comes today for a medication management evaluation. She has Opiate use; Long term prescription opiate use; Long term current use of opiate analgesic; Encounter for therapeutic drug level monitoring; Knee joint pain; Status post bariatric surgery; Morbid obesity ([place]); Osteoarthritis; Cataract, nuclear; Allergic rhinitis; AMD (age related macular degeneration); Common migraine; Cornea guttata; Depression; Hyperlipidemia; Hypertension; Peptic ulcer; Posterior subcapsular polar age-related cataract of both eyes; Opioid use agreement exists; Myofascial pain; Muscle spasms of lower extremity; Esophageal reflux; Encounter for chronic pain management; Vitamin D deficiency; Elevated C-reactive protein (CRP); Age-related nuclear cataract of both eyes; Chronic pain syndrome; Osteoarthritis of knee (Bilateral) (L>R); Chronic migraine; Gastric bypass status for obesity; Impaired fasting glucose; Iron deficiency anemia; Osteoarthritis of both knees; Status post cataract extraction and insertion of intraocular lens of left eye; Status post cataract extraction and insertion of intraocular lens of right eye; Chronic knee pain; Other specified health status; and Pain in unspecified joint on her problem list. Her primarily concern today is the Knee Pain (bilateral)  Pain Assessment: Location: Left, Right Knee Radiating:   Onset: More than a month ago Duration: Chronic pain Quality: Aching,  Sharp Severity: 4 /10 (self-reported pain score)  Note: Reported level is compatible with observation.                    Effect on ADL:   Timing: Constant Modifying factors: medications, ice rest  Sophia Jackson was last scheduled for an appointment on 05/22/2017 for medication management. During today's appointment we reviewed Sophia Jackson's chronic pain status, as well as her outpatient medication regimen. She denies any current swelling in her knees. She states that at some point she will need to have them replaced. She denies any recent weakness. She states that the left knee sometimes gives out. She does have pain that goes down into her calf. She states that this comes and goes. She denies any side effects of the medication, Norco. She uses this regularly but she tries to sometimes use only as needed.   The patient  reports that she does not use drugs. Her body mass index is 38.74 kg/m.  Further details on both, my assessment(s), as well as the proposed treatment plan, please see below.  Controlled Substance Pharmacotherapy Assessment REMS (Risk Evaluation and Mitigation Strategy)  Analgesic:Hydrocodone/APAP 7.5/325 one every 8 hours (22.5 mg/day of hydrocodone) MME/day:22.5 mg/day. Hart Rochester, RN  08/18/2017  8:21 AM  Sign at close encounter Nursing Pain Medication Assessment:  Safety precautions to be maintained throughout the outpatient stay will include: orient to surroundings, keep bed in low position, maintain call bell within reach at all times, provide assistance with transfer out of bed and ambulation.  Medication Inspection Compliance: Pill count conducted under aseptic conditions, in front of the patient. Neither the pills nor the bottle was removed from the patient's sight at any time. Once count was completed pills were immediately returned to the patient in their original bottle.  Medication: Hydrocodone/APAP Pill/Patch  Count: 17 of 90 pills remain Pill/Patch  Appearance: Markings consistent with prescribed medication Bottle Appearance: Standard pharmacy container. Clearly labeled. Filled Date: 09 / 20 / 2018 Last Medication intake:  Yesterday   Pharmacokinetics: Liberation and absorption (onset of action): WNL Distribution (time to peak effect): WNL Metabolism and excretion (duration of action): WNL         Pharmacodynamics: Desired effects: Analgesia: Sophia Jackson reports >50% benefit. Functional ability: Patient reports that medication allows her to accomplish basic ADLs Clinically meaningful improvement in function (CMIF): Sustained CMIF goals met Perceived effectiveness: Described as relatively effective, allowing for increase in activities of daily living (ADL) Undesirable effects: Side-effects or Adverse reactions: None reported Monitoring: [place] PMP: Online review of the past 69-monthperiod conducted. Compliant with practice rules and regulations Last UDS on record: Summary  Date Value Ref Range Status  02/20/2017 FINAL  Final    Comment:    ==================================================================== TOXASSURE SELECT 13 (MW) ==================================================================== Test                             Result       Flag       Units Drug Present and Declared for Prescription Verification   Hydrocodone                    375          EXPECTED   ng/mg creat   Norhydrocodone                 765          EXPECTED   ng/mg creat    Sources of hydrocodone include scheduled prescription    medications. Norhydrocodone is an expected metabolite of    hydrocodone. ==================================================================== Test                      Result    Flag   Units      Ref Range   Creatinine              20               mg/dL      >=20 ==================================================================== Declared Medications:  The flagging and interpretation on this report are based on the   following declared medications.  Unexpected results may arise from  inaccuracies in the declared medications.  **Note: The testing scope of this panel includes these medications:  Hydrocodone (Hydrocodone-Acetaminophen)  **Note: The testing scope of this panel does not include following  reported medications:  Acetaminophen (Hydrocodone-Acetaminophen)  Amlodipine  Atenolol  Cholecalciferol  Cyanocobalamin  Hydrochlorothiazide (Losartan-HCTZ)  Losartan (Losartan-HCTZ)  Multivitamin (MVI)  Nortriptyline  Omeprazole  Sertraline  Sumatriptan  Tizanidine ==================================================================== For clinical consultation, please call [phone] ====================================================================    UDS interpretation: Compliant          Medication Assessment Form: Reviewed. Patient indicates being compliant with therapy Treatment compliance: Compliant Risk Assessment Profile: Aberrant behavior: See prior evaluations. None observed or detected today Comorbid factors increasing risk of overdose: See prior notes. No additional risks detected today Risk of substance use disorder (SUD): Low     Opioid Risk Tool - 08/18/17 0819      Family History of Substance Abuse   Alcohol Negative   Illegal Drugs Negative   Rx Drugs Negative     Personal History of Substance Abuse   Alcohol Negative   Illegal Drugs Negative  Rx Drugs Negative     Age   Age between 13-45 years  No     History of Preadolescent Sexual Abuse   History of Preadolescent Sexual Abuse Negative or Female     Psychological Disease   Psychological Disease Positive   Depression Positive     Total Score   Opioid Risk Tool Scoring 3   Opioid Risk Interpretation Low Risk     ORT Scoring interpretation table:  Score <3 = Low Risk for SUD  Score between 4-7 = Moderate Risk for SUD  Score >8 = High Risk for Opioid Abuse   Risk Mitigation Strategies:  Patient  Counseling: Covered Patient-Prescriber Agreement (PPA): Present and active  Notification to other healthcare providers: Done  Pharmacologic Plan: No change in therapy, at this time  Laboratory Chemistry  Inflammation Markers (CRP: Acute Phase) (ESR: Chronic Phase) Lab Results  Component Value Date   CRP 1.2 (H) 05/23/2016   ESRSEDRATE 43 (H) 05/23/2016                 Renal Function Markers Lab Results  Component Value Date   BUN 15 05/23/2016   CREATININE 0.97 05/23/2016   GFRAA >60 05/23/2016   GFRNONAA >60 05/23/2016                 Hepatic Function Markers Lab Results  Component Value Date   AST 24 05/23/2016   ALT 18 05/23/2016   ALBUMIN 4.1 05/23/2016   ALKPHOS 124 05/23/2016                 Electrolytes Lab Results  Component Value Date   NA 141 05/23/2016   K 4.1 05/23/2016   CL 105 05/23/2016   CALCIUM 9.4 05/23/2016   MG 2.2 05/23/2016                 Neuropathy Markers Lab Results  Component Value Date   VITAMINB12 1,285 (H) 05/23/2016                 Bone Pathology Markers Lab Results  Component Value Date   ALKPHOS 124 05/23/2016   25OHVITD1 36 05/23/2016   25OHVITD2 <1.0 05/23/2016   25OHVITD3 36 05/23/2016   CALCIUM 9.4 05/23/2016                 Coagulation Parameters No results found for: INR, LABPROT, APTT, PLT               Cardiovascular Markers No results found for: BNP, HGB, HCT               Note: Lab results reviewed.  Recent Diagnostic Imaging Results   Complexity Note: Imaging results reviewed. Results shared with Ms. Mel Almond, using Layman's terms.                         Meds   Current Outpatient Prescriptions:  .  amLODipine (NORVASC) 5 MG tablet, Take 5 mg by mouth daily., Disp: , Rfl:  .  atenolol (TENORMIN) 25 MG tablet, Take 25 mg by mouth daily., Disp: , Rfl:  .  Cholecalciferol (VITAMIN D) 2000 UNITS tablet, Take 2,000 Units by mouth daily., Disp: , Rfl:  .  diazepam (VALIUM) 5 MG tablet, Take 5 mg by mouth  as needed for anxiety (prior to dental work)., Disp: , Rfl:  .  [START ON 08/23/2017] HYDROcodone-acetaminophen (NORCO) 7.5-325 MG tablet, Take 1 tablet by mouth every 8 (eight) hours as  needed for severe pain., Disp: 90 tablet, Rfl: 0 .  losartan-hydrochlorothiazide (HYZAAR) 100-25 MG tablet, Take 1 tablet by mouth daily., Disp: , Rfl:  .  Multiple Vitamins-Minerals (MULTIVITAMIN WITH MINERALS) tablet, Take 1 tablet by mouth at bedtime. , Disp: , Rfl:  .  nortriptyline (PAMELOR) 50 MG capsule, Take 50 mg by mouth at bedtime. , Disp: , Rfl:  .  omeprazole (PRILOSEC OTC) 20 MG tablet, Take 20 mg by mouth daily as needed. , Disp: , Rfl:  .  sertraline (ZOLOFT) 100 MG tablet, Take one and 1/2 tablets (150 mg) by mouth daily, Disp: , Rfl:  .  SUMAtriptan (IMITREX) 100 MG tablet, Take 100 mg by mouth once. as needed for Migraine (May repeat in 2 hours. Max 2/24hours.), Disp: , Rfl:  .  vitamin B-12 (CYANOCOBALAMIN) 100 MCG tablet, Take 500 mcg by mouth at bedtime. , Disp: , Rfl:  .  [START ON 09/22/2017] HYDROcodone-acetaminophen (NORCO) 7.5-325 MG tablet, Take 1 tablet by mouth every 8 (eight) hours as needed for severe pain., Disp: 90 tablet, Rfl: 0 .  [START ON 10/22/2017] HYDROcodone-acetaminophen (NORCO) 7.5-325 MG tablet, Take 1 tablet by mouth every 8 (eight) hours as needed for severe pain., Disp: 90 tablet, Rfl: 0  ROS  Constitutional: Denies any fever or chills Gastrointestinal: No reported hemesis, hematochezia, vomiting, or acute GI distress Musculoskeletal: Denies any acute onset joint swelling, redness, loss of ROM, or weakness Neurological: No reported episodes of acute onset apraxia, aphasia, dysarthria, agnosia, amnesia, paralysis, loss of coordination, or loss of consciousness  Allergies  Sophia Jackson is allergic to 2,4-d dimethylamine (amisol); diphenhydramine; diphenhydramine hcl; ibuprofen; morphine and related; nsaids; tetanus toxoids; diclofenac; morphine; naproxen; and voltaren   [diclofenac sodium].  [place]  Drug: Sophia Jackson  reports that she does not use drugs. Alcohol:  reports that she does not drink alcohol. Tobacco:  reports that she has never smoked. She has never used smokeless tobacco. Medical:  has a past medical history of Acid reflux (10/27/2007); Arthritis (07/27/2007); Avitaminosis D (10/12/2012); Cataract; Depression; Gonalgia (05/30/2011); H/O cataract extraction (01/20/2015); History of migraine (08/30/2015); Hypertension; Migraines; Osteoarthritis; and Status post bariatric surgery (08/30/2015). Surgical: Sophia Jackson  has a past surgical history that includes Abdominal hysterectomy; Bariatric Surgery; Wrist surgery; Knee surgery; and Tonsillectomy. Family: family history includes Cancer in her mother; Heart disease in her father.  Constitutional Exam  General appearance: Well nourished, well developed, and well hydrated. In no apparent acute distress Vitals:   08/18/17 0815  BP: 117/69  Pulse: 63  Resp: 16  Temp: 98.5 F (36.9 C)  TempSrc: Oral  SpO2: 100%  Weight: 240 lb (108.9 kg)  Height: '5\' 6"'  (1.676 m)  Psych/Mental status: Alert, oriented x 3 (person, place, & time)       Eyes: PERLA Respiratory: No evidence of acute respiratory distress  Gait & Posture Assessment  Ambulation: Unassisted Gait: Relatively normal for age and body habitus Posture: WNL   Lower Extremity Exam    Side: Right lower extremity  Side: Left lower extremity  Skin & Extremity Inspection: Skin color, temperature, and hair growth are WNL. No peripheral edema or cyanosis. No masses, redness, swelling, asymmetry, or associated skin lesions. No contractures.  Skin & Extremity Inspection: Skin color, temperature, and hair growth are WNL. No peripheral edema or cyanosis. No masses, redness, swelling, asymmetry, or associated skin lesions. No contractures.  Functional ROM: Unrestricted ROM          Functional ROM: Unrestricted ROM  Muscle Tone/Strength: Functionally  intact. No obvious neuro-muscular anomalies detected.  Muscle Tone/Strength: Functionally intact. No obvious neuro-muscular anomalies detected.  Sensory (Neurological): Unimpaired  Sensory (Neurological): Unimpaired  Palpation: No palpable anomalies  Palpation: No palpable anomalies   Assessment  Primary Diagnosis & Pertinent Problem List: The primary encounter diagnosis was Chronic pain of both knees. Diagnoses of Osteoarthritis of knee (Bilateral) (L>R), Chronic pain syndrome, Long term current use of opiate analgesic, Other specified health status, and Arthralgia of both knees were also pertinent to this visit.  Status Diagnosis  Controlled Controlled Controlled 1. Chronic pain of both knees   2. Osteoarthritis of knee (Bilateral) (L>R)   3. Chronic pain syndrome   4. Long term current use of opiate analgesic   5. Other specified health status   6. Arthralgia of both knees     Problems updated and reviewed during this visit: Problem  Chronic Knee Pain  Other Specified Health Status  Pain in Unspecified Joint   Plan of Care  Pharmacotherapy (Medications Ordered): Meds ordered this encounter  Medications  . HYDROcodone-acetaminophen (NORCO) 7.5-325 MG tablet    Sig: Take 1 tablet by mouth every 8 (eight) hours as needed for severe pain.    Dispense:  90 tablet    Refill:  0    Do not place this medication, or any other prescription from our practice, on "Automatic Refill". Patient may have prescription filled one day early if pharmacy is closed on scheduled refill date. Do not fill until: 08/23/2017 To last until: 09/22/2017    Order Specific Question:   Supervising Provider    Answer:   Milinda Pointer [phone]  . HYDROcodone-acetaminophen (NORCO) 7.5-325 MG tablet    Sig: Take 1 tablet by mouth every 8 (eight) hours as needed for severe pain.    Dispense:  90 tablet    Refill:  0    Do not place this medication, or any other prescription from our practice, on "Automatic  Refill". Patient may have prescription filled one day early if pharmacy is closed on scheduled refill date. Do not fill until: 09/22/2017 To last until: 10/22/2017    Order Specific Question:   Supervising Provider    Answer:   Milinda Pointer [phone]  . HYDROcodone-acetaminophen (NORCO) 7.5-325 MG tablet    Sig: Take 1 tablet by mouth every 8 (eight) hours as needed for severe pain.    Dispense:  90 tablet    Refill:  0    Do not place this medication, or any other prescription from our practice, on "Automatic Refill". Patient may have prescription filled one day early if pharmacy is closed on scheduled refill date. Do not fill until: 10/22/2017 To last until: 11/21/2017    Order Specific Question:   Supervising Provider    Answer:   Milinda Pointer [phone]   New Prescriptions   No medications on file   Medications administered today: Sophia Jackson had no medications administered during this visit. Lab-work, procedure(s), and/or referral(s): Orders Placed This Encounter  Procedures  . ToxASSURE Select 13 (MW), Urine  . C-reactive protein  . Sedimentation rate   Imaging and/or referral(s): None  Interventional therapies: Planned, scheduled, and/or pending: None at this time    Considering: Bilateral intra-articular Hyalgan knee injections. Bilateral intra-articular knee injections with Hyalgan versus genicular nerve blocks followed by radiofrequency.    Palliative PRN treatment(s): Bilateral intra-articular knee injection with steroids versus Hyalgan.    Provider-requested follow-up: Return in about 3 months (around 11/18/2017) for MedMgmt.  Future Appointments Date Time Provider [place]  11/17/2017 8:30 AM Vevelyn Francois, NP [facility] None   Primary Care Physician: Stann Mainland, MD Location: [facility] Note by: Vevelyn Francois NP Date: 08/18/2017; Time: 1:58 PM  Pain Score Disclaimer: We use the NRS-11  scale. This is a self-reported, subjective measurement of pain severity with only modest accuracy. It is used primarily to identify changes within a particular patient. It must be understood that outpatient pain scales are significantly less accurate that those used for research, where they can be applied under ideal controlled circumstances with minimal exposure to variables. In reality, the score is likely to be a combination of pain intensity and pain affect, where pain affect describes the degree of emotional arousal or changes in action readiness caused by the sensory experience of pain. Factors such as social and work situation, setting, emotional state, anxiety levels, expectation, and prior pain experience may influence pain perception and show large inter-individual differences that may also be affected by time variables.  Patient instructions provided during this appointment: Patient Instructions    ____________________________________________________________________________________________  Medication Rules  Applies to: All patients receiving prescriptions (written or electronic).  Pharmacy of record: Pharmacy where electronic prescriptions will be sent. If written prescriptions are taken to a different pharmacy, please inform the nursing staff. The pharmacy listed in the electronic medical record should be the one where you would like electronic prescriptions to be sent.  Prescription refills: Only during scheduled appointments. Applies to both, written and electronic prescriptions.  NOTE: The following applies primarily to controlled substances (Opioid* Pain Medications).   Patient's responsibilities: 1. Pain Pills: Bring all pain pills to every appointment (except for procedure appointments). 2. Pill Bottles: Bring pills in original pharmacy bottle. Always bring newest bottle. Bring bottle, even if empty. 3. Medication refills: You are responsible for knowing and keeping track of  what medications you need refilled. The day before your appointment, write a list of all prescriptions that need to be refilled. Bring that list to your appointment and give it to the admitting nurse. Prescriptions will be written only during appointments. If you forget a medication, it will not be "Called in", "Faxed", or "electronically sent". You will need to get another appointment to get these prescribed. 4. Prescription Accuracy: You are responsible for carefully inspecting your prescriptions before leaving our office. Have the discharge nurse carefully go over each prescription with you, before taking them home. Make sure that your name is accurately spelled, that your address is correct. Check the name and dose of your medication to make sure it is accurate. Check the number of pills, and the written instructions to make sure they are clear and accurate. Make sure that you are given enough medication to last until your next medication refill appointment. 5. Taking Medication: Take medication as prescribed. Never take more pills than instructed. Never take medication more frequently than prescribed. Taking less pills or less frequently is permitted and encouraged, when it comes to controlled substances (written prescriptions).  6. Inform other Doctors: Always inform, all of your healthcare providers, of all the medications you take. 7. Pain Medication from other Providers: You are not allowed to accept any additional pain medication from any other Doctor or Healthcare provider. There are two exceptions to this rule. (see below) In the event that you require additional pain medication, you are responsible for notifying us, as stated below. 8. Medication Agreement: You are responsible for carefully reading and following  our Medication Agreement. This must be signed before receiving any prescriptions from our practice. Safely store a copy of your signed Agreement. Violations to the Agreement will result in  no further prescriptions. (Additional copies of our Medication Agreement are available upon request.) 9. Laws, Rules, & Regulations: All patients are expected to follow all Federal and [employer], [employer], Rules, [employer]. Ignorance of the Laws does not constitute a valid excuse. The use of any illegal substances is prohibited. 10. Adopted CDC guidelines & recommendations: Target dosing levels will be at or below 60 MME/day. Use of benzodiazepines** is not recommended.  Exceptions: There are only two exceptions to the rule of not receiving pain medications from other Healthcare Providers. 1. Exception #1 (Emergencies): In the event of an emergency (i.e.: accident requiring emergency care), you are allowed to receive additional pain medication. However, you are responsible for: As soon as you are able, call our office [phone], at any time of the day or night, and leave a message stating your name, the date and nature of the emergency, and the name and dose of the medication prescribed. In the event that your call is answered by a member of our staff, make sure to document and save the date, time, and the name of the person that took your information.  2. Exception #2 (Planned Surgery): In the event that you are scheduled by another doctor or dentist to have any type of surgery or procedure, you are allowed (for a period no longer than 30 days), to receive additional pain medication, for the acute post-op pain. However, in this case, you are responsible for picking up a copy of our "Post-op Pain Management for Surgeons" handout, and giving it to your surgeon or dentist. This document is available at our office, and does not require an appointment to obtain it. Simply go to our office during business hours (Monday-Thursday from 8:00 AM to 4:00 PM) (Friday 8:00 AM to 12:00 Noon) or if you have a scheduled appointment with [place], prior to your surgery, and ask for it by name. In addition, you will need to  provide [place] with your name, name of your surgeon, type of surgery, and date of procedure or surgery.  *Opioid medications include: morphine, codeine, oxycodone, oxymorphone, hydrocodone, hydromorphone, meperidine, tramadol, tapentadol, buprenorphine, fentanyl, methadone. **Benzodiazepine medications include: diazepam (Valium), alprazolam (Xanax), clonazepam (Klonopine), lorazepam (Ativan), clorazepate (Tranxene), chlordiazepoxide (Librium), estazolam (Prosom), oxazepam (Serax), temazepam (Restoril), triazolam (Halcion)  ____________________________________________________________________________________________  BMI Assessment: Estimated body mass index is 38.74 kg/m as calculated from the following:   Height as of this encounter: '5\' 6"'  (1.676 m).   Weight as of this encounter: 240 lb (108.9 kg).  BMI interpretation table: BMI level Category Range association with higher incidence of chronic pain  <18 kg/m2 Underweight   18.5-24.9 kg/m2 Ideal body weight   25-29.9 kg/m2 Overweight Increased incidence by 20%  30-34.9 kg/m2 Obese (Class I) Increased incidence by 68%  35-39.9 kg/m2 Severe obesity (Class II) Increased incidence by 136%  >40 kg/m2 Extreme obesity (Class III) Increased incidence by 254%   BMI Readings from Last 4 Encounters:  08/18/17 38.74 kg/m  05/22/17 38.41 kg/m  02/20/17 37.93 kg/m  11/12/16 38.74 kg/m   Wt Readings from Last 4 Encounters:  08/18/17 240 lb (108.9 kg)  05/22/17 238 lb (108 kg)  02/20/17 235 lb (106.6 kg)  11/12/16 240 lb (108.9 kg)

## 2017-08-19 LAB — C-REACTIVE PROTEIN: CRP: 12.3 mg/L — ABNORMAL HIGH (ref 0.0–4.9)

## 2017-08-19 LAB — SEDIMENTATION RATE: Sed Rate: 28 mm/hr (ref 0–40)

## 2017-08-23 LAB — TOXASSURE SELECT 13 (MW), URINE

## 2017-11-17 ENCOUNTER — Ambulatory Visit: Payer: BC Managed Care – PPO | Attending: Nurse Practitioner | Admitting: Nurse Practitioner

## 2017-11-17 ENCOUNTER — Encounter: Payer: Self-pay | Admitting: Nurse Practitioner

## 2017-11-17 ENCOUNTER — Other Ambulatory Visit: Payer: Self-pay

## 2017-11-17 VITALS — BP 126/93 | HR 77 | Temp 98.3°F | Resp 16 | Ht 66.0 in | Wt 240.0 lb

## 2017-11-17 DIAGNOSIS — Z79891 Long term (current) use of opiate analgesic: Secondary | ICD-10-CM | POA: Insufficient documentation

## 2017-11-17 DIAGNOSIS — Z6838 Body mass index (BMI) 38.0-38.9, adult: Secondary | ICD-10-CM | POA: Insufficient documentation

## 2017-11-17 DIAGNOSIS — I10 Essential (primary) hypertension: Secondary | ICD-10-CM | POA: Diagnosis not present

## 2017-11-17 DIAGNOSIS — E785 Hyperlipidemia, unspecified: Secondary | ICD-10-CM | POA: Insufficient documentation

## 2017-11-17 DIAGNOSIS — Z885 Allergy status to narcotic agent status: Secondary | ICD-10-CM | POA: Diagnosis not present

## 2017-11-17 DIAGNOSIS — M170 Bilateral primary osteoarthritis of knee: Secondary | ICD-10-CM | POA: Diagnosis not present

## 2017-11-17 DIAGNOSIS — H25813 Combined forms of age-related cataract, bilateral: Secondary | ICD-10-CM | POA: Insufficient documentation

## 2017-11-17 DIAGNOSIS — G8929 Other chronic pain: Secondary | ICD-10-CM

## 2017-11-17 DIAGNOSIS — Z886 Allergy status to analgesic agent status: Secondary | ICD-10-CM | POA: Diagnosis not present

## 2017-11-17 DIAGNOSIS — Z79899 Other long term (current) drug therapy: Secondary | ICD-10-CM | POA: Insufficient documentation

## 2017-11-17 DIAGNOSIS — Z9884 Bariatric surgery status: Secondary | ICD-10-CM | POA: Diagnosis not present

## 2017-11-17 DIAGNOSIS — G894 Chronic pain syndrome: Secondary | ICD-10-CM | POA: Insufficient documentation

## 2017-11-17 DIAGNOSIS — M25561 Pain in right knee: Secondary | ICD-10-CM | POA: Diagnosis not present

## 2017-11-17 DIAGNOSIS — D509 Iron deficiency anemia, unspecified: Secondary | ICD-10-CM | POA: Insufficient documentation

## 2017-11-17 DIAGNOSIS — K219 Gastro-esophageal reflux disease without esophagitis: Secondary | ICD-10-CM | POA: Diagnosis not present

## 2017-11-17 DIAGNOSIS — Z5181 Encounter for therapeutic drug level monitoring: Secondary | ICD-10-CM | POA: Insufficient documentation

## 2017-11-17 DIAGNOSIS — F329 Major depressive disorder, single episode, unspecified: Secondary | ICD-10-CM | POA: Insufficient documentation

## 2017-11-17 DIAGNOSIS — E559 Vitamin D deficiency, unspecified: Secondary | ICD-10-CM | POA: Diagnosis not present

## 2017-11-17 DIAGNOSIS — M7918 Myalgia, other site: Secondary | ICD-10-CM

## 2017-11-17 DIAGNOSIS — M25562 Pain in left knee: Secondary | ICD-10-CM

## 2017-11-17 MED ORDER — HYDROCODONE-ACETAMINOPHEN 7.5-325 MG PO TABS: 1.0000 | ORAL_TABLET | Freq: Three times a day (TID) | ORAL | 0 refills | Status: DC | PRN

## 2017-11-17 NOTE — Progress Notes (Addendum)
Patient's Name: Sophia Jackson  MRN: [id number]  Referring Provider: Stann Mainland, MD  DOB: 04-27-1961  PCP: [facility]  DOS: 11/17/2017  Note by: Vevelyn Francois NP  Service setting: Ambulatory outpatient  Specialty: Interventional Pain Management  Location: [facility] (AMB) Pain Management Facility    Patient type: Established    Primary Reason(s) for Visit: Encounter for prescription drug management. (Level of risk: moderate)  CC: Knee Pain (both) and Joint Pain (generalized r/t rheumatoid arthritis dx)  HPI  Sophia Jackson is a 57 y.o. year old, female patient, who comes today for a medication management evaluation. She has Opiate use; Long term prescription opiate use; Long term current use of opiate analgesic; Encounter for therapeutic drug level monitoring; Knee joint pain; Status post bariatric surgery; Morbid obesity ([place]); Osteoarthritis; Cataract, nuclear; Allergic rhinitis; AMD (age related macular degeneration); Common migraine; Cornea guttata; Depression; Hyperlipidemia; Hypertension; Peptic ulcer; Posterior subcapsular polar age-related cataract of both eyes; Opioid use agreement exists; Myofascial pain; Muscle spasms of lower extremity; Esophageal reflux; Encounter for chronic pain management; Vitamin D deficiency; Elevated C-reactive protein (CRP); Age-related nuclear cataract of both eyes; Chronic pain syndrome; Osteoarthritis of knee (Bilateral) (L>R); Chronic migraine; Gastric bypass status for obesity; Impaired fasting glucose; Iron deficiency anemia; Osteoarthritis of both knees; Status post cataract extraction and insertion of intraocular lens of left eye; Status post cataract extraction and insertion of intraocular lens of right eye; Chronic knee pain; Other specified health status; and Pain in unspecified joint on their problem list. Her primarily concern today is the Knee Pain (both) and Joint Pain (generalized r/t rheumatoid arthritis dx)  Pain  Assessment: Location: Right, Left Knee Radiating: na Onset: More than a month ago Duration: Chronic pain Quality: Constant, Aching, Stabbing, Sharp Severity: 5 /10 (self-reported pain score)  Note: Reported level is compatible with observation.                          Effect on ADL: difficulty getting up and down, changing positions Timing: Constant Modifying factors: meds  Sophia Jackson was last scheduled for an appointment on 08/18/2017 for medication management. During today's appointment we reviewed Sophia Jackson's chronic pain status, as well as her outpatient medication regimen. She admits that her knee pain is getting worse. She is aware that the use of interventional therapy would be effective for the treatment of her OA.  She admits that she is unable to afford this secondary to her deductible. She is SP gastric bypass so she is unable to use NSAID's. She admits that Voltaren even affects her stomach. She states that the over-the-counter medication; ie Capsicin is not effective for her pain. She states that heat and ice are helpful. She admits that she ices her knees each night before bed. She has changed her mattress which she also effective for the treatment of her pain.  She is not on any exercise regimen. She does admit that she has a 65 lbs pitbull that she walks around.   The patient  reports that she does not use drugs. Her body mass index is 38.74 kg/m.  Further details on both, my assessment(s), as well as the proposed treatment plan, please see below.  Controlled Substance Pharmacotherapy Assessment REMS (Risk Evaluation and Mitigation Strategy)  Analgesic:Hydrocodone/APAP 7.5/325 one every 8 hours (22.5 mg/day of hydrocodone) MME/day:22.5 mg/day.   Sophia Jackson  11/17/2017 10:20 AM  Signed Nursing Pain Medication Assessment:  Safety precautions to be maintained throughout  the outpatient stay will include: orient to surroundings, keep bed in low position, maintain call  bell within reach at all times, provide assistance with transfer out of bed and ambulation.  Medication Inspection Compliance: Pill count conducted under aseptic conditions, in front of the patient. Neither the pills nor the bottle was removed from the patient's sight at any time. Once count was completed pills were immediately returned to the patient in their original bottle.  Medication: Hydrocodone/APAP Pill/Patch Count: 11 of 90 pills remain Pill/Patch Appearance: Markings consistent with prescribed medication Bottle Appearance: Standard pharmacy container. Clearly labeled. Filled Date: 25 / 19 / 2018 Last Medication intake:  Yesterday   Pharmacokinetics: Liberation and absorption (onset of action): WNL Distribution (time to peak effect): WNL Metabolism and excretion (duration of action): WNL         Pharmacodynamics: Desired effects: Analgesia: Sophia Jackson reports >50% benefit. Functional ability: Patient reports that medication allows her to accomplish basic ADLs Clinically meaningful improvement in function (CMIF): Sustained CMIF goals met Perceived effectiveness: Described as relatively effective, allowing for increase in activities of daily living (ADL) Undesirable effects: Side-effects or Adverse reactions: None reported Monitoring: [place] PMP: Online review of the past 27-monthperiod conducted. Compliant with practice rules and regulations Last UDS on record: Summary  Date Value Ref Range Status  08/18/2017 FINAL  Final    Comment:    ==================================================================== TOXASSURE SELECT 13 (MW) ==================================================================== Test                             Result       Flag       Units Drug Present and Declared for Prescription Verification   Hydrocodone                    161          EXPECTED   ng/mg creat   Hydromorphone                  94           EXPECTED   ng/mg creat   Dihydrocodeine                  136          EXPECTED   ng/mg creat   Norhydrocodone                 568          EXPECTED   ng/mg creat    Sources of hydrocodone include scheduled prescription    medications. Hydromorphone, dihydrocodeine and norhydrocodone are    expected metabolites of hydrocodone. Hydromorphone and    dihydrocodeine are also available as scheduled prescription    medications. Drug Absent but Declared for Prescription Verification   Diazepam                       Not Detected UNEXPECTED ng/mg creat ==================================================================== Test                      Result    Flag   Units      Ref Range   Creatinine              90               mg/dL      >=20 ==================================================================== Declared Medications:  The flagging and interpretation on this  report are based on the  following declared medications.  Unexpected results may arise from  inaccuracies in the declared medications.  **Note: The testing scope of this panel includes these medications:  Diazepam (Valium)  Hydrocodone (Norco)  **Note: The testing scope of this panel does not include following  reported medications:  Acetaminophen (Norco)  Amlodipine (Norvasc)  Atenolol (Tenormin)  Cyanocobalamin  Hydrochlorothiazide (Hyzaar)  Losartan (Hyzaar)  Multivitamin  Nortriptyline (Pamelor)  Omeprazole (Prilosec)  Sertraline (Zoloft)  Sumatriptan (Imitrex)  Vitamin D ==================================================================== For clinical consultation, please call [phone]. ====================================================================    UDS interpretation: Compliant          Medication Assessment Form: Reviewed. Patient indicates being compliant with therapy Treatment compliance: Compliant Risk Assessment Profile: Aberrant behavior: See prior evaluations. None observed or detected today Comorbid factors increasing risk of overdose: See  prior notes. No additional risks detected today Risk of substance use disorder (SUD): Low Opioid Risk Tool - 11/17/17 0844      Family History of Substance Abuse   Alcohol  Negative    Illegal Drugs  Negative    Rx Drugs  Negative      Personal History of Substance Abuse   Alcohol  Negative    Illegal Drugs  Negative    Rx Drugs  Negative      Age   Age between 11-45 years   No      History of Preadolescent Sexual Abuse   History of Preadolescent Sexual Abuse  Negative or Female      Psychological Disease   Psychological Disease  Negative    Depression  Positive      Total Score   Opioid Risk Tool Scoring  1    Opioid Risk Interpretation  Low Risk      ORT Scoring interpretation table:  Score <3 = Low Risk for SUD  Score between 4-7 = Moderate Risk for SUD  Score >8 = High Risk for Opioid Abuse   Risk Mitigation Strategies:  Patient Counseling: Covered Patient-Prescriber Agreement (PPA): Present and active  Notification to other healthcare providers: Done  Pharmacologic Plan: No change in therapy, at this time.             Laboratory Chemistry  Inflammation Markers (CRP: Acute Phase) (ESR: Chronic Phase) Lab Results  Component Value Date   CRP 12.3 (H) 08/18/2017   ESRSEDRATE 28 08/18/2017                 Rheumatology Markers No results found for: RF, ANA, Therisa Doyne, [facility]              Renal Function Markers Lab Results  Component Value Date   BUN 15 05/23/2016   CREATININE 0.97 05/23/2016   GFRAA >60 05/23/2016   GFRNONAA >60 05/23/2016                 Hepatic Function Markers Lab Results  Component Value Date   AST 24 05/23/2016   ALT 18 05/23/2016   ALBUMIN 4.1 05/23/2016   ALKPHOS 124 05/23/2016                 Electrolytes Lab Results  Component Value Date   NA 141 05/23/2016   K 4.1 05/23/2016   CL 105 05/23/2016   CALCIUM 9.4 05/23/2016   MG 2.2 05/23/2016                 Neuropathy Markers Lab Results   Component Value Date   VITAMINB12  1,285 (H) 05/23/2016                 Bone Pathology Markers Lab Results  Component Value Date   25OHVITD1 36 05/23/2016   25OHVITD2 <1.0 05/23/2016   25OHVITD3 36 05/23/2016                 Coagulation Parameters No results found for: INR, LABPROT, APTT, PLT, DDIMER               Cardiovascular Markers No results found for: BNP, CKTOTAL, CKMB, TROPONINI, HGB, HCT               CA Markers No results found for: CEA, CA125, LABCA2               Note: Lab results reviewed.  Recent Diagnostic Imaging Results   Complexity Note: Imaging results reviewed. Results shared with Ms. Mel Almond, using Layman's terms.                         Meds   Current Outpatient Medications:  .  amLODipine (NORVASC) 5 MG tablet, Take 5 mg by mouth daily., Disp: , Rfl:  .  atenolol (TENORMIN) 25 MG tablet, Take 25 mg by mouth daily., Disp: , Rfl:  .  Cholecalciferol (VITAMIN D) 2000 UNITS tablet, Take 2,000 Units by mouth daily., Disp: , Rfl:  .  diazepam (VALIUM) 5 MG tablet, Take 5 mg by mouth as needed for anxiety (prior to dental work)., Disp: , Rfl:  .  HYDROcodone-acetaminophen (NORCO) 7.5-325 MG tablet, Take 1 tablet by mouth every 8 (eight) hours as needed for severe pain., Disp: 90 tablet, Rfl: 0 .  losartan-hydrochlorothiazide (HYZAAR) 100-25 MG tablet, Take 1 tablet by mouth daily., Disp: , Rfl:  .  Multiple Vitamins-Minerals (MULTIVITAMIN WITH MINERALS) tablet, Take 1 tablet by mouth at bedtime. , Disp: , Rfl:  .  nortriptyline (PAMELOR) 50 MG capsule, Take 50 mg by mouth at bedtime. , Disp: , Rfl:  .  omeprazole (PRILOSEC OTC) 20 MG tablet, Take 20 mg by mouth daily as needed. , Disp: , Rfl:  .  sertraline (ZOLOFT) 100 MG tablet, Take one and 1/2 tablets (150 mg) by mouth daily, Disp: , Rfl:  .  SUMAtriptan (IMITREX) 100 MG tablet, Take 100 mg by mouth once. as needed for Migraine (May repeat in 2 hours. Max 2/24hours.), Disp: , Rfl:  .  tiZANidine  (ZANAFLEX) 4 MG tablet, , Disp: , Rfl:  .  vitamin B-12 (CYANOCOBALAMIN) 100 MCG tablet, Take 500 mcg by mouth at bedtime. , Disp: , Rfl:  .  ferrous sulfate 325 (65 FE) MG EC tablet, Take 325 mg by mouth daily with breakfast., Disp: , Rfl:  .  HYDROcodone-acetaminophen (NORCO) 7.5-325 MG tablet, Take 1 tablet by mouth every 8 (eight) hours as needed for severe pain., Disp: 90 tablet, Rfl: 0 .  HYDROcodone-acetaminophen (NORCO) 7.5-325 MG tablet, Take 1 tablet by mouth every 8 (eight) hours as needed for severe pain., Disp: 90 tablet, Rfl: 0  ROS  Constitutional: Denies any fever or chills Gastrointestinal: No reported hemesis, hematochezia, vomiting, or acute GI distress Musculoskeletal: Denies any acute onset joint swelling, redness, loss of ROM, or weakness Neurological: No reported episodes of acute onset apraxia, aphasia, dysarthria, agnosia, amnesia, paralysis, loss of coordination, or loss of consciousness  Allergies  Ms. Looper is allergic to 2,4-d dimethylamine (amisol); diphenhydramine; diphenhydramine hcl; ibuprofen; morphine and related; nsaids; tetanus toxoids; diclofenac; morphine; naproxen; and  voltaren  [diclofenac sodium].  [place]  Drug: Ms. Ozbun  reports that she does not use drugs. Alcohol:  reports that she does not drink alcohol. Tobacco:  reports that she has never smoked. She has never used smokeless tobacco. Medical:  has a past medical history of Acid reflux (10/27/2007), Arthritis (07/27/2007), Avitaminosis D (10/12/2012), Cataract, Depression, Gonalgia (05/30/2011), H/O cataract extraction (01/20/2015), History of migraine (08/30/2015), Hypertension, Migraines, Osteoarthritis, and Status post bariatric surgery (08/30/2015). Surgical: Ms. Kistler  has a past surgical history that includes Abdominal hysterectomy; Bariatric Surgery; Wrist surgery; Knee surgery; and Tonsillectomy. Family: family history includes Cancer in her mother; Heart disease in her  father.  Constitutional Exam  General appearance: Well nourished, well developed, and well hydrated. In no apparent acute distress Vitals:   11/17/17 0833  BP: (!) 126/93  Pulse: 77  Resp: 16  Temp: 98.3 F (36.8 C)  TempSrc: Oral  SpO2: 99%  Weight: 240 lb (108.9 kg)  Height: '5\' 6"'  (1.676 m)  Psych/Mental status: Alert, oriented x 3 (person, place, & time)       Eyes: PERLA Respiratory: No evidence of acute respiratory distress  Lumbar Spine Area Exam  Skin & Axial Inspection: No masses, redness, or swelling Alignment: Symmetrical Functional ROM: Unrestricted ROM      Stability: No instability detected Muscle Tone/Strength: Functionally intact. No obvious neuro-muscular anomalies detected. Sensory (Neurological): Unimpaired Palpation: No palpable anomalies       Provocative Tests: Lumbar Hyperextension and rotation test: evaluation deferred today       Lumbar Lateral bending test: evaluation deferred today       Patrick's Maneuver: evaluation deferred today                    Gait & Posture Assessment  Ambulation: Unassisted Gait: Relatively normal for age and body habitus Posture: WNL   Lower Extremity Exam    Side: Right lower extremity  Side: Left lower extremity  Skin & Extremity Inspection: Skin color, temperature, and hair growth are WNL. No peripheral edema or cyanosis. No masses, redness, swelling, asymmetry, or associated skin lesions. No contractures.  Skin & Extremity Inspection: Skin color, temperature, and hair growth are WNL. No peripheral edema or cyanosis. No masses, redness, swelling, asymmetry, or associated skin lesions. No contractures.  Functional ROM: Unrestricted ROM          Functional ROM: Unrestricted ROM          Muscle Tone/Strength: Functionally intact. No obvious neuro-muscular anomalies detected.  Muscle Tone/Strength: Functionally intact. No obvious neuro-muscular anomalies detected.  Sensory (Neurological): Unimpaired  Sensory  (Neurological): Unimpaired  Palpation: No palpable anomalies  Palpation: No palpable anomalies   Assessment  Primary Diagnosis & Pertinent Problem List: The primary encounter diagnosis was Chronic pain of both knees. Diagnoses of Osteoarthritis of knee (Bilateral) (L>R), Arthralgia of both knees, Chronic pain syndrome, and Myofascial pain were also pertinent to this visit.  Status Diagnosis  Persistent Persistent Persistent 1. Chronic pain of both knees   2. Osteoarthritis of knee (Bilateral) (L>R)   3. Arthralgia of both knees   4. Chronic pain syndrome   5. Myofascial pain     Problems updated and reviewed during this visit: No problems updated. Plan of Care  Pharmacotherapy (Medications Ordered): Meds ordered this encounter  Medications  . HYDROcodone-acetaminophen (NORCO) 7.5-325 MG tablet    Sig: Take 1 tablet by mouth every 8 (eight) hours as needed for severe pain.    Dispense:  90 tablet  Refill:  0    Do not place this medication, or any other prescription from our practice, on "Automatic Refill". Patient may have prescription filled one day early if pharmacy is closed on scheduled refill date. Do not fill until:01/20/2018 To last until: 02/19/2018    Order Specific Question:   Supervising Provider    Answer:   Milinda Pointer [phone]  . HYDROcodone-acetaminophen (NORCO) 7.5-325 MG tablet    Sig: Take 1 tablet by mouth every 8 (eight) hours as needed for severe pain.    Dispense:  90 tablet    Refill:  0    Do not place this medication, or any other prescription from our practice, on "Automatic Refill". Patient may have prescription filled one day early if pharmacy is closed on scheduled refill date. Do not fill until: 12/21/2017 To last until: 01/20/2018    Order Specific Question:   Supervising Provider    Answer:   Milinda Pointer [phone]  . HYDROcodone-acetaminophen (NORCO) 7.5-325 MG tablet    Sig: Take 1 tablet by mouth every 8 (eight) hours as needed for  severe pain.    Dispense:  90 tablet    Refill:  0    Do not place this medication, or any other prescription from our practice, on "Automatic Refill". Patient may have prescription filled one day early if pharmacy is closed on scheduled refill date. Do not fill until:11/21/2017 To last until: 12/21/2017    Order Specific Question:   Supervising Provider    Answer:   Milinda Pointer [phone]   New Prescriptions   No medications on file   Medications administered today: Smrithi A. Kreher had no medications administered during this visit. Lab-work, procedure(s), and/or referral(s): No orders of the defined types were placed in this encounter.  Imaging and/or referral(s): None  Interventional therapies: Planned, scheduled, and/or pending: None at this time    Considering: Bilateral intra-articular Hyalgan knee injections. Bilateral intra-articular knee injections with Hyalgan versus genicular nerve blocks followed by radiofrequency.    Palliative PRN treatment(s): Bilateral intra-articular knee injection with steroids versus Hyalgan.    Provider-requested follow-up: Return in about 3 months (around 02/15/2018) for MedMgmt with Me Donella Stade Edison Pace).  Future Appointments  Date Time Provider [place]  02/05/2018  8:30 AM Vevelyn Francois, NP [facility] None   Primary Care Physician: [facility] Location: [facility] Management Facility Note by: Vevelyn Francois NP Date: 11/17/2017; Time: 8:24 AM  Pain Score Disclaimer: We use the NRS-11 scale. This is a self-reported, subjective measurement of pain severity with only modest accuracy. It is used primarily to identify changes within a particular patient. It must be understood that outpatient pain scales are significantly less accurate that those used for research, where they can be applied under ideal controlled circumstances with minimal exposure to variables. In reality, the score is likely to be  a combination of pain intensity and pain affect, where pain affect describes the degree of emotional arousal or changes in action readiness caused by the sensory experience of pain. Factors such as social and work situation, setting, emotional state, anxiety levels, expectation, and prior pain experience may influence pain perception and show large inter-individual differences that may also be affected by time variables.  Patient instructions provided during this appointment: Patient Instructions    ____________________________________________________________________________________________  Medication Rules  Applies to: All patients receiving prescriptions (written or electronic).  Pharmacy of record: Pharmacy where electronic prescriptions will be sent. If written prescriptions are taken to a  different pharmacy, please inform the nursing staff. The pharmacy listed in the electronic medical record should be the one where you would like electronic prescriptions to be sent.  Prescription refills: Only during scheduled appointments. Applies to both, written and electronic prescriptions.  NOTE: The following applies primarily to controlled substances (Opioid* Pain Medications).   Patient's responsibilities: 1. Pain Pills: Bring all pain pills to every appointment (except for procedure appointments). 2. Pill Bottles: Bring pills in original pharmacy bottle. Always bring newest bottle. Bring bottle, even if empty. 3. Medication refills: You are responsible for knowing and keeping track of what medications you need refilled. The day before your appointment, write a list of all prescriptions that need to be refilled. Bring that list to your appointment and give it to the admitting nurse. Prescriptions will be written only during appointments. If you forget a medication, it will not be "Called in", "Faxed", or "electronically sent". You will need to get another appointment to get these  prescribed. 4. Prescription Accuracy: You are responsible for carefully inspecting your prescriptions before leaving our office. Have the discharge nurse carefully go over each prescription with you, before taking them home. Make sure that your name is accurately spelled, that your address is correct. Check the name and dose of your medication to make sure it is accurate. Check the number of pills, and the written instructions to make sure they are clear and accurate. Make sure that you are given enough medication to last until your next medication refill appointment. 5. Taking Medication: Take medication as prescribed. Never take more pills than instructed. Never take medication more frequently than prescribed. Taking less pills or less frequently is permitted and encouraged, when it comes to controlled substances (written prescriptions).  6. Inform other Doctors: Always inform, all of your healthcare providers, of all the medications you take. 7. Pain Medication from other Providers: You are not allowed to accept any additional pain medication from any other Doctor or Healthcare provider. There are two exceptions to this rule. (see below) In the event that you require additional pain medication, you are responsible for notifying us, as stated below. 8. Medication Agreement: You are responsible for carefully reading and following our Medication Agreement. This must be signed before receiving any prescriptions from our practice. Safely store a copy of your signed Agreement. Violations to the Agreement will result in no further prescriptions. (Additional copies of our Medication Agreement are available upon request.) 9. Laws, Rules, & Regulations: All patients are expected to follow all Federal and [employer], [employer], Rules, [employer]. Ignorance of the Laws does not constitute a valid excuse. The use of any illegal substances is prohibited. 10. Adopted CDC guidelines & recommendations: Target dosing  levels will be at or below 60 MME/day. Use of benzodiazepines** is not recommended.  Exceptions: There are only two exceptions to the rule of not receiving pain medications from other Healthcare Providers. 1. Exception #1 (Emergencies): In the event of an emergency (i.e.: accident requiring emergency care), you are allowed to receive additional pain medication. However, you are responsible for: As soon as you are able, call our office [phone], at any time of the day or night, and leave a message stating your name, the date and nature of the emergency, and the name and dose of the medication prescribed. In the event that your call is answered by a member of our staff, make sure to document and save the date, time, and the name of the person that took  your information.  2. Exception #2 (Planned Surgery): In the event that you are scheduled by another doctor or dentist to have any type of surgery or procedure, you are allowed (for a period no longer than 30 days), to receive additional pain medication, for the acute post-op pain. However, in this case, you are responsible for picking up a copy of our "Post-op Pain Management for Surgeons" handout, and giving it to your surgeon or dentist. This document is available at our office, and does not require an appointment to obtain it. Simply go to our office during business hours (Monday-Thursday from 8:00 AM to 4:00 PM) (Friday 8:00 AM to 12:00 Noon) or if you have a scheduled appointment with [place], prior to your surgery, and ask for it by name. In addition, you will need to provide [place] with your name, name of your surgeon, type of surgery, and date of procedure or surgery.  *Opioid medications include: morphine, codeine, oxycodone, oxymorphone, hydrocodone, hydromorphone, meperidine, tramadol, tapentadol, buprenorphine, fentanyl, methadone. **Benzodiazepine medications include: diazepam (Valium), alprazolam (Xanax), clonazepam (Klonopine), lorazepam (Ativan),  clorazepate (Tranxene), chlordiazepoxide (Librium), estazolam (Prosom), oxazepam (Serax), temazepam (Restoril), triazolam (Halcion)  ____________________________________________________________________________________________   BMI Assessment: Estimated body mass index is 38.74 kg/m as calculated from the following:   Height as of this encounter: '5\' 6"'  (1.676 m).   Weight as of this encounter: 240 lb (108.9 kg).  BMI interpretation table: BMI level Category Range association with higher incidence of chronic pain  <18 kg/m2 Underweight   18.5-24.9 kg/m2 Ideal body weight   25-29.9 kg/m2 Overweight Increased incidence by 20%  30-34.9 kg/m2 Obese (Class I) Increased incidence by 68%  35-39.9 kg/m2 Severe obesity (Class II) Increased incidence by 136%  >40 kg/m2 Extreme obesity (Class III) Increased incidence by 254%   BMI Readings from Last 4 Encounters:  11/17/17 38.74 kg/m  08/18/17 38.74 kg/m  05/22/17 38.41 kg/m  02/20/17 37.93 kg/m   Wt Readings from Last 4 Encounters:  11/17/17 240 lb (108.9 kg)  08/18/17 240 lb (108.9 kg)  05/22/17 238 lb (108 kg)  02/20/17 235 lb (106.6 kg)

## 2017-11-17 NOTE — Progress Notes (Signed)
Nursing Pain Medication Assessment:  Safety precautions to be maintained throughout the outpatient stay will include: orient to surroundings, keep bed in low position, maintain call bell within reach at all times, provide assistance with transfer out of bed and ambulation.  Medication Inspection Compliance: Pill count conducted under aseptic conditions, in front of the patient. Neither the pills nor the bottle was removed from the patient's sight at any time. Once count was completed pills were immediately returned to the patient in their original bottle.  Medication: Hydrocodone/APAP Pill/Patch Count: 11 of 90 pills remain Pill/Patch Appearance: Markings consistent with prescribed medication Bottle Appearance: Standard pharmacy container. Clearly labeled. Filled Date: 6512 / 19 / 2018 Last Medication intake:  Yesterday

## 2017-11-17 NOTE — Patient Instructions (Addendum)
____________________________________________________________________________________________  Medication Rules  Applies to: All patients receiving prescriptions (written or electronic).  Pharmacy of record: Pharmacy where electronic prescriptions will be sent. If written prescriptions are taken to a different pharmacy, please inform the nursing staff. The pharmacy listed in the electronic medical record should be the one where you would like electronic prescriptions to be sent.  Prescription refills: Only during scheduled appointments. Applies to both, written and electronic prescriptions.  NOTE: The following applies primarily to controlled substances (Opioid* Pain Medications).   Patient's responsibilities: 1. Pain Pills: Bring all pain pills to every appointment (except for procedure appointments). 2. Pill Bottles: Bring pills in original pharmacy bottle. Always bring newest bottle. Bring bottle, even if empty. 3. Medication refills: You are responsible for knowing and keeping track of what medications you need refilled. The day before your appointment, write a list of all prescriptions that need to be refilled. Bring that list to your appointment and give it to the admitting nurse. Prescriptions will be written only during appointments. If you forget a medication, it will not be "Called in", "Faxed", or "electronically sent". You will need to get another appointment to get these prescribed. 4. Prescription Accuracy: You are responsible for carefully inspecting your prescriptions before leaving our office. Have the discharge nurse carefully go over each prescription with you, before taking them home. Make sure that your name is accurately spelled, that your address is correct. Check the name and dose of your medication to make sure it is accurate. Check the number of pills, and the written instructions to make sure they are clear and accurate. Make sure that you are given enough medication to  last until your next medication refill appointment. 5. Taking Medication: Take medication as prescribed. Never take more pills than instructed. Never take medication more frequently than prescribed. Taking less pills or less frequently is permitted and encouraged, when it comes to controlled substances (written prescriptions).  6. Inform other Doctors: Always inform, all of your healthcare providers, of all the medications you take. 7. Pain Medication from other Providers: You are not allowed to accept any additional pain medication from any other Doctor or Healthcare provider. There are two exceptions to this rule. (see below) In the event that you require additional pain medication, you are responsible for notifying us, as stated below. 8. Medication Agreement: You are responsible for carefully reading and following our Medication Agreement. This must be signed before receiving any prescriptions from our practice. Safely store a copy of your signed Agreement. Violations to the Agreement will result in no further prescriptions. (Additional copies of our Medication Agreement are available upon request.) 9. Laws, Rules, & Regulations: All patients are expected to follow all Federal and State Laws, Statutes, Rules, & Regulations. Ignorance of the Laws does not constitute a valid excuse. The use of any illegal substances is prohibited. 10. Adopted CDC guidelines & recommendations: Target dosing levels will be at or below 60 MME/day. Use of benzodiazepines** is not recommended.  Exceptions: There are only two exceptions to the rule of not receiving pain medications from other Healthcare Providers. 1. Exception #1 (Emergencies): In the event of an emergency (i.e.: accident requiring emergency care), you are allowed to receive additional pain medication. However, you are responsible for: As soon as you are able, call our office [phone], at any time of the day or night, and leave a message stating your  name, the date and nature of the emergency, and the name and dose of the medication   prescribed. In the event that your call is answered by a member of our staff, make sure to document and save the date, time, and the name of the person that took your information.  2. Exception #2 (Planned Surgery): In the event that you are scheduled by another doctor or dentist to have any type of surgery or procedure, you are allowed (for a period no longer than 30 days), to receive additional pain medication, for the acute post-op pain. However, in this case, you are responsible for picking up a copy of our "Post-op Pain Management for Surgeons" handout, and giving it to your surgeon or dentist. This document is available at our office, and does not require an appointment to obtain it. Simply go to our office during business hours (Monday-Thursday from 8:00 AM to 4:00 PM) (Friday 8:00 AM to 12:00 Noon) or if you have a scheduled appointment with us, prior to your surgery, and ask for it by name. In addition, you will need to provide us with your name, name of your surgeon, type of surgery, and date of procedure or surgery.  *Opioid medications include: morphine, codeine, oxycodone, oxymorphone, hydrocodone, hydromorphone, meperidine, tramadol, tapentadol, buprenorphine, fentanyl, methadone. **Benzodiazepine medications include: diazepam (Valium), alprazolam (Xanax), clonazepam (Klonopine), lorazepam (Ativan), clorazepate (Tranxene), chlordiazepoxide (Librium), estazolam (Prosom), oxazepam (Serax), temazepam (Restoril), triazolam (Halcion)  ____________________________________________________________________________________________   BMI Assessment: Estimated body mass index is 38.74 kg/m as calculated from the following:   Height as of this encounter: 5\' 6"  (1.676 m).   Weight as of this encounter: 240 lb (108.9 kg).  BMI interpretation table: BMI level Category Range association with higher incidence of chronic  pain  <18 kg/m2 Underweight   18.5-24.9 kg/m2 Ideal body weight   25-29.9 kg/m2 Overweight Increased incidence by 20%  30-34.9 kg/m2 Obese (Class I) Increased incidence by 68%  35-39.9 kg/m2 Severe obesity (Class II) Increased incidence by 136%  >40 kg/m2 Extreme obesity (Class III) Increased incidence by 254%   BMI Readings from Last 4 Encounters:  11/17/17 38.74 kg/m  08/18/17 38.74 kg/m  05/22/17 38.41 kg/m  02/20/17 37.93 kg/m   Wt Readings from Last 4 Encounters:  11/17/17 240 lb (108.9 kg)  08/18/17 240 lb (108.9 kg)  05/22/17 238 lb (108 kg)  02/20/17 235 lb (106.6 kg)

## 2018-01-06 ENCOUNTER — Ambulatory Visit: Payer: BC Managed Care – PPO | Admitting: Nurse Practitioner

## 2018-02-05 ENCOUNTER — Encounter: Payer: Self-pay | Admitting: Nurse Practitioner

## 2018-02-05 ENCOUNTER — Other Ambulatory Visit: Payer: Self-pay

## 2018-02-05 ENCOUNTER — Ambulatory Visit: Payer: BC Managed Care – PPO | Attending: Nurse Practitioner | Admitting: Nurse Practitioner

## 2018-02-05 VITALS — BP 123/83 | HR 59 | Temp 97.8°F | Resp 18 | Ht 66.0 in | Wt 240.0 lb

## 2018-02-05 DIAGNOSIS — M7918 Myalgia, other site: Secondary | ICD-10-CM | POA: Diagnosis not present

## 2018-02-05 DIAGNOSIS — Z885 Allergy status to narcotic agent status: Secondary | ICD-10-CM | POA: Insufficient documentation

## 2018-02-05 DIAGNOSIS — D509 Iron deficiency anemia, unspecified: Secondary | ICD-10-CM | POA: Diagnosis not present

## 2018-02-05 DIAGNOSIS — J309 Allergic rhinitis, unspecified: Secondary | ICD-10-CM | POA: Insufficient documentation

## 2018-02-05 DIAGNOSIS — M150 Primary generalized (osteo)arthritis: Secondary | ICD-10-CM

## 2018-02-05 DIAGNOSIS — Z888 Allergy status to other drugs, medicaments and biological substances status: Secondary | ICD-10-CM | POA: Insufficient documentation

## 2018-02-05 DIAGNOSIS — H3530 Unspecified macular degeneration: Secondary | ICD-10-CM | POA: Insufficient documentation

## 2018-02-05 DIAGNOSIS — R7982 Elevated C-reactive protein (CRP): Secondary | ICD-10-CM | POA: Insufficient documentation

## 2018-02-05 DIAGNOSIS — E559 Vitamin D deficiency, unspecified: Secondary | ICD-10-CM | POA: Insufficient documentation

## 2018-02-05 DIAGNOSIS — M25562 Pain in left knee: Secondary | ICD-10-CM

## 2018-02-05 DIAGNOSIS — M170 Bilateral primary osteoarthritis of knee: Secondary | ICD-10-CM | POA: Insufficient documentation

## 2018-02-05 DIAGNOSIS — I10 Essential (primary) hypertension: Secondary | ICD-10-CM | POA: Insufficient documentation

## 2018-02-05 DIAGNOSIS — Z9842 Cataract extraction status, left eye: Secondary | ICD-10-CM | POA: Insufficient documentation

## 2018-02-05 DIAGNOSIS — E785 Hyperlipidemia, unspecified: Secondary | ICD-10-CM | POA: Diagnosis not present

## 2018-02-05 DIAGNOSIS — G894 Chronic pain syndrome: Secondary | ICD-10-CM

## 2018-02-05 DIAGNOSIS — Z9889 Other specified postprocedural states: Secondary | ICD-10-CM | POA: Diagnosis not present

## 2018-02-05 DIAGNOSIS — Z79891 Long term (current) use of opiate analgesic: Secondary | ICD-10-CM

## 2018-02-05 DIAGNOSIS — Z79899 Other long term (current) drug therapy: Secondary | ICD-10-CM | POA: Diagnosis not present

## 2018-02-05 DIAGNOSIS — Z9884 Bariatric surgery status: Secondary | ICD-10-CM | POA: Insufficient documentation

## 2018-02-05 DIAGNOSIS — F329 Major depressive disorder, single episode, unspecified: Secondary | ICD-10-CM | POA: Insufficient documentation

## 2018-02-05 DIAGNOSIS — Z8249 Family history of ischemic heart disease and other diseases of the circulatory system: Secondary | ICD-10-CM | POA: Insufficient documentation

## 2018-02-05 DIAGNOSIS — H25813 Combined forms of age-related cataract, bilateral: Secondary | ICD-10-CM | POA: Insufficient documentation

## 2018-02-05 DIAGNOSIS — G8929 Other chronic pain: Secondary | ICD-10-CM | POA: Diagnosis not present

## 2018-02-05 DIAGNOSIS — M159 Polyosteoarthritis, unspecified: Secondary | ICD-10-CM

## 2018-02-05 DIAGNOSIS — M25561 Pain in right knee: Secondary | ICD-10-CM

## 2018-02-05 DIAGNOSIS — K219 Gastro-esophageal reflux disease without esophagitis: Secondary | ICD-10-CM | POA: Insufficient documentation

## 2018-02-05 DIAGNOSIS — Z886 Allergy status to analgesic agent status: Secondary | ICD-10-CM | POA: Diagnosis not present

## 2018-02-05 DIAGNOSIS — E119 Type 2 diabetes mellitus without complications: Secondary | ICD-10-CM | POA: Diagnosis not present

## 2018-02-05 DIAGNOSIS — Z90710 Acquired absence of both cervix and uterus: Secondary | ICD-10-CM | POA: Insufficient documentation

## 2018-02-05 MED ORDER — HYDROCODONE-ACETAMINOPHEN 7.5-325 MG PO TABS: 1.0000 | ORAL_TABLET | Freq: Three times a day (TID) | ORAL | 0 refills | Status: DC | PRN

## 2018-02-05 NOTE — Patient Instructions (Signed)
____________________________________________________________________________________________  Medication Rules  Applies to: All patients receiving prescriptions (written or electronic).  Pharmacy of record: Pharmacy where electronic prescriptions will be sent. If written prescriptions are taken to a different pharmacy, please inform the nursing staff. The pharmacy listed in the electronic medical record should be the one where you would like electronic prescriptions to be sent.  Prescription refills: Only during scheduled appointments. Applies to both, written and electronic prescriptions.  NOTE: The following applies primarily to controlled substances (Opioid* Pain Medications).   Patient's responsibilities: 1. Pain Pills: Bring all pain pills to every appointment (except for procedure appointments). 2. Pill Bottles: Bring pills in original pharmacy bottle. Always bring newest bottle. Bring bottle, even if empty. 3. Medication refills: You are responsible for knowing and keeping track of what medications you need refilled. The day before your appointment, write a list of all prescriptions that need to be refilled. Bring that list to your appointment and give it to the admitting nurse. Prescriptions will be written only during appointments. If you forget a medication, it will not be "Called in", "Faxed", or "electronically sent". You will need to get another appointment to get these prescribed. 4. Prescription Accuracy: You are responsible for carefully inspecting your prescriptions before leaving our office. Have the discharge nurse carefully go over each prescription with you, before taking them home. Make sure that your name is accurately spelled, that your address is correct. Check the name and dose of your medication to make sure it is accurate. Check the number of pills, and the written instructions to make sure they are clear and accurate. Make sure that you are given enough medication to last  until your next medication refill appointment. 5. Taking Medication: Take medication as prescribed. Never take more pills than instructed. Never take medication more frequently than prescribed. Taking less pills or less frequently is permitted and encouraged, when it comes to controlled substances (written prescriptions).  6. Inform other Doctors: Always inform, all of your healthcare providers, of all the medications you take. 7. Pain Medication from other Providers: You are not allowed to accept any additional pain medication from any other Doctor or Healthcare provider. There are two exceptions to this rule. (see below) In the event that you require additional pain medication, you are responsible for notifying us, as stated below. 8. Medication Agreement: You are responsible for carefully reading and following our Medication Agreement. This must be signed before receiving any prescriptions from our practice. Safely store a copy of your signed Agreement. Violations to the Agreement will result in no further prescriptions. (Additional copies of our Medication Agreement are available upon request.) 9. Laws, Rules, & Regulations: All patients are expected to follow all Federal and State Laws, Statutes, Rules, & Regulations. Ignorance of the Laws does not constitute a valid excuse. The use of any illegal substances is prohibited. 10. Adopted CDC guidelines & recommendations: Target dosing levels will be at or below 60 MME/day. Use of benzodiazepines** is not recommended.  Exceptions: There are only two exceptions to the rule of not receiving pain medications from other Healthcare Providers. 1. Exception #1 (Emergencies): In the event of an emergency (i.e.: accident requiring emergency care), you are allowed to receive additional pain medication. However, you are responsible for: As soon as you are able, call our office [phone], at any time of the day or night, and leave a message stating your name, the  date and nature of the emergency, and the name and dose of the medication   prescribed. In the event that your call is answered by a member of our staff, make sure to document and save the date, time, and the name of the person that took your information.  2. Exception #2 (Planned Surgery): In the event that you are scheduled by another doctor or dentist to have any type of surgery or procedure, you are allowed (for a period no longer than 30 days), to receive additional pain medication, for the acute post-op pain. However, in this case, you are responsible for picking up a copy of our "Post-op Pain Management for Surgeons" handout, and giving it to your surgeon or dentist. This document is available at our office, and does not require an appointment to obtain it. Simply go to our office during business hours (Monday-Thursday from 8:00 AM to 4:00 PM) (Friday 8:00 AM to 12:00 Noon) or if you have a scheduled appointment with us, prior to your surgery, and ask for it by name. In addition, you will need to provide us with your name, name of your surgeon, type of surgery, and date of procedure or surgery.  *Opioid medications include: morphine, codeine, oxycodone, oxymorphone, hydrocodone, hydromorphone, meperidine, tramadol, tapentadol, buprenorphine, fentanyl, methadone. **Benzodiazepine medications include: diazepam (Valium), alprazolam (Xanax), clonazepam (Klonopine), lorazepam (Ativan), clorazepate (Tranxene), chlordiazepoxide (Librium), estazolam (Prosom), oxazepam (Serax), temazepam (Restoril), triazolam (Halcion) (Last updated: 01/01/2018) ____________________________________________________________________________________________    BMI Assessment: Estimated body mass index is 38.74 kg/m as calculated from the following:   Height as of this encounter: 5\' 6"  (1.676 m).   Weight as of this encounter: 240 lb (108.9 kg).  BMI interpretation table: BMI level Category Range association with higher  incidence of chronic pain  <18 kg/m2 Underweight   18.5-24.9 kg/m2 Ideal body weight   25-29.9 kg/m2 Overweight Increased incidence by 20%  30-34.9 kg/m2 Obese (Class I) Increased incidence by 68%  35-39.9 kg/m2 Severe obesity (Class II) Increased incidence by 136%  >40 kg/m2 Extreme obesity (Class III) Increased incidence by 254%   BMI Readings from Last 4 Encounters:  02/05/18 38.74 kg/m  11/17/17 38.74 kg/m  08/18/17 38.74 kg/m  05/22/17 38.41 kg/m   Wt Readings from Last 4 Encounters:  02/05/18 240 lb (108.9 kg)  11/17/17 240 lb (108.9 kg)  08/18/17 240 lb (108.9 kg)  05/22/17 238 lb (108 kg)

## 2018-02-05 NOTE — Progress Notes (Signed)
Patient's Name: Sophia Jackson  MRN: [id number]  Referring Provider: No ref. provider found  DOB: December 03, 1960  PCP: [facility]  DOS: 02/05/2018  Note by: Vevelyn Francois NP  Service setting: Ambulatory outpatient  Specialty: Interventional Pain Management  Location: [facility] (AMB) Pain Management Facility    Patient type: Established    Primary Reason(s) for Visit: Encounter for prescription drug management. (Level of risk: moderate)  CC: Knee Pain (bilateral)  HPI  [name] is a 57 y.o. year old, female patient, who comes today for a medication management evaluation. She has Opiate use; Long term prescription opiate use; Long term current use of opiate analgesic; Encounter for therapeutic drug level monitoring; Knee joint pain; Status post bariatric surgery; Morbid obesity ([place]); Osteoarthritis; Cataract, nuclear; Allergic rhinitis; AMD (age related macular degeneration); Common migraine; Cornea guttata; Depression; Hyperlipidemia; Peptic ulcer; Posterior subcapsular polar age-related cataract of both eyes; Opioid use agreement exists; Myofascial pain; Muscle spasms of lower extremity; Esophageal reflux; Encounter for chronic pain management; Vitamin D deficiency, unspecified; Elevated C-reactive protein (CRP); Age-related nuclear cataract of both eyes; Chronic pain syndrome; Osteoarthritis of knee (Bilateral) (L>R); Chronic migraine; S/P gastric bypass; Impaired fasting glucose; Iron deficiency anemia; Osteoarthritis of both knees; Status post cataract extraction and insertion of intraocular lens of left eye; Status post cataract extraction and insertion of intraocular lens of right eye; Chronic knee pain; Other specified health status; Pain in unspecified joint; Major depressive disorder, single episode, unspecified; Type 2 diabetes mellitus treated without insulin ([place]); Migraine without aura; and Essential hypertension on their problem list. Her primarily concern today is the Knee Pain  (bilateral)  Pain Assessment: Location: Right, Left Knee Radiating: denies Onset: More than a month ago Duration: Chronic pain Quality: Sharp, Constant Severity: 3 /10 (self-reported pain score)  Note: Reported level is compatible with observation.                          Timing: Constant Modifying factors: medications, ice, rest  [name] was last scheduled for an appointment on 11/17/2017 for medication management. During today's appointment we reviewed [name]'s chronic pain status, as well as her outpatient medication regimen. She states that she was  grounded by a german shepard hitting her  into her right knee. She states that it does not feel like it did when she broke it. She has been using ice and admits that it is effective. She admits that she was using a cane for support. She has not used a brace. She denies any numbness or tingling. She denies any additional falls. She admits that she is using care. She does continue to work full-time.   The patient  reports that she does not use drugs. Her body mass index is 38.74 kg/m.  Further details on both, my assessment(s), as well as the proposed treatment plan, please see below.  Controlled Substance Pharmacotherapy Assessment REMS (Risk Evaluation and Mitigation Strategy)  Analgesic:Hydrocodone/APAP 7.5/325 one every 8 hours (22.5 mg/day of hydrocodone) MME/day:22.5 mg/day.    Hart Rochester, RN  02/05/2018  8:21 AM  Sign at close encounter Nursing Pain Medication Assessment:  Safety precautions to be maintained throughout the outpatient stay will include: orient to surroundings, keep bed in low position, maintain call bell within reach at all times, provide assistance with transfer out of bed and ambulation.  Medication Inspection Compliance: Pill count conducted under aseptic conditions, in front of the patient. Neither the pills nor the  bottle was removed from the patient's sight at any time. Once count was completed  pills were immediately returned to the patient in their original bottle.  Medication: Hydrocodone/APAP Pill/Patch Count: 42 of 90 pills remain Pill/Patch Appearance: Markings consistent with prescribed medication Bottle Appearance: Standard pharmacy container. Clearly labeled. Filled Date: 03 / 19 / 2019 Last Medication intake:  Yesterday   Pharmacokinetics: Liberation and absorption (onset of action): WNL Distribution (time to peak effect): WNL Metabolism and excretion (duration of action): WNL         Pharmacodynamics: Desired effects: Analgesia: [name] reports >50% benefit. Functional ability: Patient reports that medication allows her to accomplish basic ADLs Clinically meaningful improvement in function (CMIF): Sustained CMIF goals met Perceived effectiveness: Described as relatively effective, allowing for increase in activities of daily living (ADL) Undesirable effects: Side-effects or Adverse reactions: None reported Monitoring: [place] PMP: Online review of the past 51-monthperiod conducted. Compliant with practice rules and regulations Last UDS on record: Summary  Date Value Ref Range Status  08/18/2017 FINAL  Final    Comment:    ==================================================================== TOXASSURE SELECT 13 (MW) ==================================================================== Test                             Result       Flag       Units Drug Present and Declared for Prescription Verification   Hydrocodone                    161          EXPECTED   ng/mg creat   Hydromorphone                  94           EXPECTED   ng/mg creat   Dihydrocodeine                 136          EXPECTED   ng/mg creat   Norhydrocodone                 568          EXPECTED   ng/mg creat    Sources of hydrocodone include scheduled prescription    medications. Hydromorphone, dihydrocodeine and norhydrocodone are    expected metabolites of hydrocodone. Hydromorphone and     dihydrocodeine are also available as scheduled prescription    medications. Drug Absent but Declared for Prescription Verification   Diazepam                       Not Detected UNEXPECTED ng/mg creat ==================================================================== Test                      Result    Flag   Units      Ref Range   Creatinine              90               mg/dL      >=20 ==================================================================== Declared Medications:  The flagging and interpretation on this report are based on the  following declared medications.  Unexpected results may arise from  inaccuracies in the declared medications.  **Note: The testing scope of this panel includes these medications:  Diazepam (Valium)  Hydrocodone (Norco)  **Note: The testing scope of this panel does not include following  reported medications:  Acetaminophen (Norco)  Amlodipine (Norvasc)  Atenolol (Tenormin)  Cyanocobalamin  Hydrochlorothiazide (Hyzaar)  Losartan (Hyzaar)  Multivitamin  Nortriptyline (Pamelor)  Omeprazole (Prilosec)  Sertraline (Zoloft)  Sumatriptan (Imitrex)  Vitamin D ==================================================================== For clinical consultation, please call [phone]. ====================================================================    UDS interpretation: Compliant          Medication Assessment Form: Reviewed. Patient indicates being compliant with therapy Treatment compliance: Compliant Risk Assessment Profile: Aberrant behavior: See prior evaluations. None observed or detected today Comorbid factors increasing risk of overdose: See prior notes. No additional risks detected today Risk of substance use disorder (SUD): Low Opioid Risk Tool - 02/05/18 0813      Family History of Substance Abuse   Alcohol  Negative    Illegal Drugs  Negative    Rx Drugs  Negative      Personal History of Substance Abuse   Alcohol  Negative     Illegal Drugs  Negative    Rx Drugs  Negative      Age   Age between 43-45 years   No      History of Preadolescent Sexual Abuse   History of Preadolescent Sexual Abuse  Negative or Female      Psychological Disease   Psychological Disease  Negative    Depression  Positive anxiety   anxiety     Total Score   Opioid Risk Tool Scoring  1    Opioid Risk Interpretation  Low Risk      ORT Scoring interpretation table:  Score <3 = Low Risk for SUD  Score between 4-7 = Moderate Risk for SUD  Score >8 = High Risk for Opioid Abuse   Risk Mitigation Strategies:  Patient Counseling: Covered Patient-Prescriber Agreement (PPA): Present and active  Notification to other healthcare providers: Done  Pharmacologic Plan: No change in therapy, at this time.             Laboratory Chemistry  Inflammation Markers (CRP: Acute Phase) (ESR: Chronic Phase) Lab Results  Component Value Date   CRP 12.3 (H) 08/18/2017   ESRSEDRATE 28 08/18/2017                         Rheumatology Markers No results found for: RF, ANA, Therisa Doyne, [facility]                      Renal Function Markers Lab Results  Component Value Date   BUN 15 05/23/2016   CREATININE 0.97 05/23/2016   GFRAA >60 05/23/2016   GFRNONAA >60 05/23/2016                              Hepatic Function Markers Lab Results  Component Value Date   AST 24 05/23/2016   ALT 18 05/23/2016   ALBUMIN 4.1 05/23/2016   ALKPHOS 124 05/23/2016                        Electrolytes Lab Results  Component Value Date   NA 141 05/23/2016   K 4.1 05/23/2016   CL 105 05/23/2016   CALCIUM 9.4 05/23/2016   MG 2.2 05/23/2016                        Neuropathy Markers Lab Results  Component Value Date   VITAMINB12 1,285 (H) 05/23/2016  Bone Pathology Markers Lab Results  Component Value Date   25OHVITD1 36 05/23/2016   25OHVITD2 <1.0 05/23/2016   25OHVITD3 36 05/23/2016                          Coagulation Parameters No results found for: INR, LABPROT, APTT, PLT, DDIMER                      Cardiovascular Markers No results found for: BNP, CKTOTAL, CKMB, TROPONINI, HGB, HCT                       CA Markers No results found for: CEA, CA125, LABCA2                      Note: Lab results reviewed.  Recent Diagnostic Imaging Results  DG Knee 4 Views W/Patella Left * PRIOR REPORT IMPORTED FROM AN EXTERNAL SYSTEM *  CLINICAL DATA:  Left-sided knee pain and decreased range of motion.   EXAM:  LEFT KNEE - COMPLETE 4+ VIEW   COMPARISON:  No priors.   FINDINGS:  Four views of the left knee demonstrate a nondisplaced mildly  depressed fracture of the lateral tibial plateau which extends  through the articular surface. Mild irregularity along the articular  surface of the lateral femoral condyle. Joint space narrowing,  subchondral sclerosis, subchondral cyst formation and osteophyte  formation is noted in a tricompartmental distribution, most severe  in the patellofemoral compartment. Large suprapatellar effusion.   IMPRESSION:  1. Nondisplaced mildly impacted fracture through the lateral tibial  plateau extending through the articular surface.  2. Large suprapatellar effusion.  3. Degenerative changes of mild to moderate osteoarthritis, most  severe in the patellofemoral compartment.   Electronically Signed    By: Vinnie Langton M.D.    On: 11/20/2013 16:46    CT EXTREMITY LOWER W/O CM*R* * PRIOR REPORT IMPORTED FROM AN EXTERNAL SYSTEM *  CLINICAL DATA:  Lateral tibial plateau fracture.   EXAM:  CT OF THE LEFT KNEE WITHOUT CONTRAST   TECHNIQUE:  Multidetector CT imaging was performed according to the standard  protocol. Multiplanar CT image reconstructions were also generated.   COMPARISON:  11/20/2013   FINDINGS:  The lateral portion of the lateral tibial plateau is fractured, with  a 2.4 x 1.2 cm oval-shaped foot print at the articular margin, and   1.5 mm of impaction of the involved fragment.   Moderate size hemarthrosis noted. A 0.6 x 0.4 x 0.4 cm well  corticated free osteochondral fragment is present posteriorly in the  knee joint just medial to the PCL distal attachment.   Tricompartmental spurring observed. The no other fracture  identified. Degenerative articular space narrowing in the  patellofemoral joint especially laterally.   IMPRESSION:  1. Fracture of the lateral portion of the lateral tibial plateau  with oval-shaped footprint at the articular margin, and is impacted  1.5 mm with respect to the rest of the tibial plateau.  Moderate-sized hemarthrosis.  2. Small free osteochondral fragment posteromedially in the knee  joint, well corticated and thought to be chronic.  3. Osteoarthritis.   Electronically Signed    By: Sherryl Barters M.D.    On: 11/25/2013 12:07     Complexity Note: Imaging results reviewed. Results shared with Ms. Mel Almond, using Layman's terms.  Meds   Current Outpatient Medications:  .  amLODipine (NORVASC) 5 MG tablet, Take 5 mg by mouth daily., Disp: , Rfl:  .  atenolol (TENORMIN) 25 MG tablet, Take 25 mg by mouth daily., Disp: , Rfl:  .  Cholecalciferol (VITAMIN D) 2000 UNITS tablet, Take 2,000 Units by mouth daily., Disp: , Rfl:  .  diazepam (VALIUM) 5 MG tablet, Take 5 mg by mouth as needed for anxiety (prior to dental work)., Disp: , Rfl:  .  ferrous sulfate 325 (65 FE) MG EC tablet, Take 325 mg by mouth daily with breakfast., Disp: , Rfl:  .  [START ON 03/21/2018] HYDROcodone-acetaminophen (NORCO) 7.5-325 MG tablet, Take 1 tablet by mouth every 8 (eight) hours as needed for severe pain., Disp: 90 tablet, Rfl: 0 .  losartan-hydrochlorothiazide (HYZAAR) 100-25 MG tablet, Take 1 tablet by mouth daily., Disp: , Rfl:  .  Multiple Vitamins-Minerals (MULTIVITAMIN WITH MINERALS) tablet, Take 1 tablet by mouth at bedtime. , Disp: , Rfl:  .  nortriptyline (PAMELOR) 50 MG  capsule, Take 50 mg by mouth at bedtime. , Disp: , Rfl:  .  omeprazole (PRILOSEC OTC) 20 MG tablet, Take 20 mg by mouth daily as needed. , Disp: , Rfl:  .  sertraline (ZOLOFT) 100 MG tablet, Take one and 1/2 tablets (150 mg) by mouth daily, Disp: , Rfl:  .  SUMAtriptan (IMITREX) 100 MG tablet, Take 100 mg by mouth once. as needed for Migraine (May repeat in 2 hours. Max 2/24hours.), Disp: , Rfl:  .  tiZANidine (ZANAFLEX) 4 MG tablet, , Disp: , Rfl:  .  vitamin B-12 (CYANOCOBALAMIN) 100 MCG tablet, Take 500 mcg by mouth at bedtime. , Disp: , Rfl:  .  [START ON 04/20/2018] HYDROcodone-acetaminophen (NORCO) 7.5-325 MG tablet, Take 1 tablet by mouth every 8 (eight) hours as needed for severe pain., Disp: 90 tablet, Rfl: 0 .  [START ON 02/19/2018] HYDROcodone-acetaminophen (NORCO) 7.5-325 MG tablet, Take 1 tablet by mouth every 8 (eight) hours as needed for severe pain., Disp: 90 tablet, Rfl: 0  ROS  Constitutional: Denies any fever or chills Gastrointestinal: No reported hemesis, hematochezia, vomiting, or acute GI distress Musculoskeletal: Denies any acute onset joint swelling, redness, loss of ROM, or weakness Neurological: No reported episodes of acute onset apraxia, aphasia, dysarthria, agnosia, amnesia, paralysis, loss of coordination, or loss of consciousness  Allergies  Ms. Mings is allergic to 2,4-d dimethylamine (amisol); diphenhydramine; diphenhydramine hcl; ibuprofen; morphine and related; nsaids; tetanus toxoids; diclofenac; morphine; naproxen; and voltaren  [diclofenac sodium].  [place]  Drug: Ms. Raval  reports that she does not use drugs. Alcohol:  reports that she does not drink alcohol. Tobacco:  reports that she has never smoked. She has never used smokeless tobacco. Medical:  has a past medical history of Acid reflux (10/27/2007), Arthritis (07/27/2007), Avitaminosis D (10/12/2012), Cataract, Depression, Gonalgia (05/30/2011), H/O cataract extraction (01/20/2015), History of migraine  (08/30/2015), Hypertension, Migraines, Osteoarthritis, and Status post bariatric surgery (08/30/2015). Surgical: Ms. Gintz  has a past surgical history that includes Abdominal hysterectomy; Bariatric Surgery; Wrist surgery; Knee surgery; and Tonsillectomy. Family: family history includes Cancer in her mother; Heart disease in her father.  Constitutional Exam  General appearance: Well nourished, well developed, and well hydrated. In no apparent acute distress Vitals:   02/05/18 0813  BP: 123/83  Pulse: (!) 59  Resp: 18  Temp: 97.8 F (36.6 C)  TempSrc: Oral  SpO2: 100%  Weight: 240 lb (108.9 kg)  Height: '5\' 6"'  (1.676 m)  Psych/Mental  status: Alert, oriented x 3 (person, place, & time)       Eyes: PERLA Respiratory: No evidence of acute respiratory distress  Lumbar Spine Area Exam  Skin & Axial Inspection: No masses, redness, or swelling Alignment: Symmetrical Functional ROM: Unrestricted ROM      Stability: No instability detected Muscle Tone/Strength: Functionally intact. No obvious neuro-muscular anomalies detected. Sensory (Neurological): Unimpaired Palpation: No palpable anomalies       Provocative Tests: Lumbar Hyperextension and rotation test: evaluation deferred today       Lumbar Lateral bending test: evaluation deferred today       Patrick's Maneuver: evaluation deferred today                    Gait & Posture Assessment  Ambulation: Unassisted Gait: Relatively normal for age and body habitus Posture: WNL   Lower Extremity Exam    Side: Right lower extremity  Side: Left lower extremity  Skin & Extremity Inspection: Skin color, temperature, and hair growth are WNL. No peripheral edema or cyanosis. No masses, redness, swelling, asymmetry, or associated skin lesions. No contractures.  Skin & Extremity Inspection: Skin color, temperature, and hair growth are WNL. No peripheral edema or cyanosis. No masses, redness, swelling, asymmetry, or associated skin lesions. No  contractures.  Functional ROM: Adequate ROM          Functional ROM: Adequate ROM          Muscle Tone/Strength: Functionally intact. No obvious neuro-muscular anomalies detected.  Muscle Tone/Strength: Normal strength (5/5)  Sensory (Neurological): Unimpaired  Sensory (Neurological): Unimpaired  Palpation: No palpable anomalies  Palpation: Non-tender   Assessment  Primary Diagnosis & Pertinent Problem List: The primary encounter diagnosis was Chronic pain of both knees. Diagnoses of Osteoarthritis, Myofascial pain, Chronic pain syndrome, and Long term prescription opiate use were also pertinent to this visit.  Status Diagnosis  Persistent Persistent Controlled 1. Chronic pain of both knees   2. Osteoarthritis   3. Myofascial pain   4. Chronic pain syndrome   5. Long term prescription opiate use     Problems updated and reviewed during this visit: Problem  Osteoarthritis of Both Knees   Overview:  Dr. Cecilie Lowers  Followed by [facility] pain clinic, on Norco   Vitamin D Deficiency, Unspecified  Esophageal Reflux   Overview:  Esophageal Reflux   S/P Gastric Bypass   Roux-en-Y in 2008   Migraine Without Aura  Type 2 Diabetes Mellitus Treated Without Insulin (Hcc)   HgbA1c 6.5% on 12/05/17, had Type 2 diabetes before gastric bypass surgery   Essential Hypertension  Major Depressive Disorder, Single Episode, Unspecified  Hypertension (Resolved)   Overview:  Hypertension    Plan of Care  Pharmacotherapy (Medications Ordered): Meds ordered this encounter  Medications  . HYDROcodone-acetaminophen (NORCO) 7.5-325 MG tablet    Sig: Take 1 tablet by mouth every 8 (eight) hours as needed for severe pain.    Dispense:  90 tablet    Refill:  0    Do not place this medication, or any other prescription from our practice, on "Automatic Refill". Patient may have prescription filled one day early if pharmacy is closed on scheduled refill date. Do not fill until: 04/20/2018 To last  until:05/20/2018    Order Specific Question:   Supervising Provider    Answer:   Milinda Pointer [phone]  . HYDROcodone-acetaminophen (NORCO) 7.5-325 MG tablet    Sig: Take 1 tablet by mouth every 8 (eight) hours as needed for severe pain.  Dispense:  90 tablet    Refill:  0    Do not place this medication, or any other prescription from our practice, on "Automatic Refill". Patient may have prescription filled one day early if pharmacy is closed on scheduled refill date. Do not fill until:03/21/2018 To last until: 04/20/2018    Order Specific Question:   Supervising Provider    Answer:   Milinda Pointer [phone]  . HYDROcodone-acetaminophen (NORCO) 7.5-325 MG tablet    Sig: Take 1 tablet by mouth every 8 (eight) hours as needed for severe pain.    Dispense:  90 tablet    Refill:  0    Do not place this medication, or any other prescription from our practice, on "Automatic Refill". Patient may have prescription filled one day early if pharmacy is closed on scheduled refill date. Do not fill until:02/19/2018 To last until: 03/21/2018    Order Specific Question:   Supervising Provider    Answer:   Milinda Pointer [phone]   New Prescriptions   No medications on file   Medications administered today: Jadia A. Sevcik had no medications administered during this visit. Lab-work, procedure(s), and/or referral(s): Orders Placed This Encounter  Procedures  . ToxASSURE Select 13 (MW), Urine   Imaging and/or referral(s): None  Interventional therapies: Planned, scheduled, and/or pending: None at this time    Considering: Bilateral intra-articular Hyalgan knee injections. Bilateral intra-articular knee injections with Hyalgan versus genicular nerve blocks followed by radiofrequency.    Palliative PRN treatment(s): Bilateral intra-articular knee injection with steroids versus Hyalgan      Provider-requested follow-up: Return in about 3 months (around 05/07/2018) for  MedMgmt with Me Donella Stade Edison Pace).  Future Appointments  Date Time Provider [place]  05/04/2018  8:30 AM Vevelyn Francois, NP [facility] None   Primary Care Physician: [facility] Location: [facility] Note by: Vevelyn Francois NP Date: 02/05/2018; Time: 10:40 AM  Pain Score Disclaimer: We use the NRS-11 scale. This is a self-reported, subjective measurement of pain severity with only modest accuracy. It is used primarily to identify changes within a particular patient. It must be understood that outpatient pain scales are significantly less accurate that those used for research, where they can be applied under ideal controlled circumstances with minimal exposure to variables. In reality, the score is likely to be a combination of pain intensity and pain affect, where pain affect describes the degree of emotional arousal or changes in action readiness caused by the sensory experience of pain. Factors such as social and work situation, setting, emotional state, anxiety levels, expectation, and prior pain experience may influence pain perception and show large inter-individual differences that may also be affected by time variables.  Patient instructions provided during this appointment: Patient Instructions   ____________________________________________________________________________________________  Medication Rules  Applies to: All patients receiving prescriptions (written or electronic).  Pharmacy of record: Pharmacy where electronic prescriptions will be sent. If written prescriptions are taken to a different pharmacy, please inform the nursing staff. The pharmacy listed in the electronic medical record should be the one where you would like electronic prescriptions to be sent.  Prescription refills: Only during scheduled appointments. Applies to both, written and electronic prescriptions.  NOTE: The following applies primarily to controlled  substances (Opioid* Pain Medications).   Patient's responsibilities: 1. Pain Pills: Bring all pain pills to every appointment (except for procedure appointments). 2. Pill Bottles: Bring pills in original pharmacy bottle. Always bring newest bottle. Bring bottle, even if  empty. 3. Medication refills: You are responsible for knowing and keeping track of what medications you need refilled. The day before your appointment, write a list of all prescriptions that need to be refilled. Bring that list to your appointment and give it to the admitting nurse. Prescriptions will be written only during appointments. If you forget a medication, it will not be "Called in", "Faxed", or "electronically sent". You will need to get another appointment to get these prescribed. 4. Prescription Accuracy: You are responsible for carefully inspecting your prescriptions before leaving our office. Have the discharge nurse carefully go over each prescription with you, before taking them home. Make sure that your name is accurately spelled, that your address is correct. Check the name and dose of your medication to make sure it is accurate. Check the number of pills, and the written instructions to make sure they are clear and accurate. Make sure that you are given enough medication to last until your next medication refill appointment. 5. Taking Medication: Take medication as prescribed. Never take more pills than instructed. Never take medication more frequently than prescribed. Taking less pills or less frequently is permitted and encouraged, when it comes to controlled substances (written prescriptions).  6. Inform other Doctors: Always inform, all of your healthcare providers, of all the medications you take. 7. Pain Medication from other Providers: You are not allowed to accept any additional pain medication from any other Doctor or Healthcare provider. There are two exceptions to this rule. (see below) In the event that you  require additional pain medication, you are responsible for notifying us, as stated below. 8. Medication Agreement: You are responsible for carefully reading and following our Medication Agreement. This must be signed before receiving any prescriptions from our practice. Safely store a copy of your signed Agreement. Violations to the Agreement will result in no further prescriptions. (Additional copies of our Medication Agreement are available upon request.) 9. Laws, Rules, & Regulations: All patients are expected to follow all Federal and [employer], [employer], Rules, [employer] [facility]. Ignorance of the Laws does not constitute a valid excuse. The use of any illegal substances is prohibited. 10. Adopted CDC guidelines & recommendations: Target dosing levels will be at or below 60 MME/day. Use of benzodiazepines** is not recommended.  Exceptions: There are only two exceptions to the rule of not receiving pain medications from other Healthcare Providers. 1. Exception #1 (Emergencies): In the event of an emergency (i.e.: accident requiring emergency care), you are allowed to receive additional pain medication. However, you are responsible for: As soon as you are able, call our office [phone], at any time of the day or night, and leave a message stating your name, the date and nature of the emergency, and the name and dose of the medication prescribed. In the event that your call is answered by a member of our staff, make sure to document and save the date, time, and the name of the person that took your information.  2. Exception #2 (Planned Surgery): In the event that you are scheduled by another doctor or dentist to have any type of surgery or procedure, you are allowed (for a period no longer than 30 days), to receive additional pain medication, for the acute post-op pain. However, in this case, you are responsible for picking up a copy of our "Post-op Pain Management for Surgeons" handout, and giving it  to your surgeon or dentist. This document is available at our office, and does not require an  appointment to obtain it. Simply go to our office during business hours (Monday-Thursday from 8:00 AM to 4:00 PM) (Friday 8:00 AM to 12:00 Noon) or if you have a scheduled appointment with [place], prior to your surgery, and ask for it by name. In addition, you will need to provide [place] with your name, name of your surgeon, type of surgery, and date of procedure or surgery.  *Opioid medications include: morphine, codeine, oxycodone, oxymorphone, hydrocodone, hydromorphone, meperidine, tramadol, tapentadol, buprenorphine, fentanyl, methadone. **Benzodiazepine medications include: diazepam (Valium), alprazolam (Xanax), clonazepam (Klonopine), lorazepam (Ativan), clorazepate (Tranxene), chlordiazepoxide (Librium), estazolam (Prosom), oxazepam (Serax), temazepam (Restoril), triazolam (Halcion) (Last updated: 01/01/2018) ____________________________________________________________________________________________    BMI Assessment: Estimated body mass index is 38.74 kg/m as calculated from the following:   Height as of this encounter: '5\' 6"'  (1.676 m).   Weight as of this encounter: 240 lb (108.9 kg).  BMI interpretation table: BMI level Category Range association with higher incidence of chronic pain  <18 kg/m2 Underweight   18.5-24.9 kg/m2 Ideal body weight   25-29.9 kg/m2 Overweight Increased incidence by 20%  30-34.9 kg/m2 Obese (Class I) Increased incidence by 68%  35-39.9 kg/m2 Severe obesity (Class II) Increased incidence by 136%  >40 kg/m2 Extreme obesity (Class III) Increased incidence by 254%   BMI Readings from Last 4 Encounters:  02/05/18 38.74 kg/m  11/17/17 38.74 kg/m  08/18/17 38.74 kg/m  05/22/17 38.41 kg/m   Wt Readings from Last 4 Encounters:  02/05/18 240 lb (108.9 kg)  11/17/17 240 lb (108.9 kg)  08/18/17 240 lb (108.9 kg)  05/22/17 238 lb (108 kg)

## 2018-02-05 NOTE — Progress Notes (Signed)
Nursing Pain Medication Assessment:  Safety precautions to be maintained throughout the outpatient stay will include: orient to surroundings, keep bed in low position, maintain call bell within reach at all times, provide assistance with transfer out of bed and ambulation.  Medication Inspection Compliance: Pill count conducted under aseptic conditions, in front of the patient. Neither the pills nor the bottle was removed from the patient's sight at any time. Once count was completed pills were immediately returned to the patient in their original bottle.  Medication: Hydrocodone/APAP Pill/Patch Count: 42 of 90 pills remain Pill/Patch Appearance: Markings consistent with prescribed medication Bottle Appearance: Standard pharmacy container. Clearly labeled. Filled Date: 03 / 19 / 2019 Last Medication intake:  Yesterday

## 2018-02-11 LAB — TOXASSURE SELECT 13 (MW), URINE

## 2018-02-15 ENCOUNTER — Other Ambulatory Visit: Payer: Self-pay

## 2018-02-15 ENCOUNTER — Encounter: Payer: Self-pay | Admitting: Emergency Medicine

## 2018-02-15 ENCOUNTER — Ambulatory Visit (INDEPENDENT_AMBULATORY_CARE_PROVIDER_SITE_OTHER): Payer: BC Managed Care – PPO

## 2018-02-15 ENCOUNTER — Ambulatory Visit
Admission: EM | Admit: 2018-02-15 | Discharge: 2018-02-15 | Disposition: A | Discharge status: Home / Self Care | Payer: BC Managed Care – PPO | Attending: Family Medicine | Admitting: Family Medicine

## 2018-02-15 DIAGNOSIS — S8002XA Contusion of left knee, initial encounter: Secondary | ICD-10-CM | POA: Diagnosis not present

## 2018-02-15 DIAGNOSIS — W06XXXA Fall from bed, initial encounter: Secondary | ICD-10-CM | POA: Diagnosis not present

## 2018-02-15 DIAGNOSIS — S62641A Nondisplaced fracture of proximal phalanx of left index finger, initial encounter for closed fracture: Secondary | ICD-10-CM | POA: Diagnosis not present

## 2018-02-15 DIAGNOSIS — S60943A Unspecified superficial injury of left middle finger, initial encounter: Secondary | ICD-10-CM

## 2018-02-15 DIAGNOSIS — S0083XA Contusion of other part of head, initial encounter: Secondary | ICD-10-CM | POA: Diagnosis not present

## 2018-02-15 MED ORDER — HYDROCODONE-ACETAMINOPHEN 5-325 MG PO TABS: ORAL_TABLET | ORAL | 0 refills | Status: DC

## 2018-02-15 NOTE — ED Provider Notes (Signed)
[facility]    CSN: [id number] Arrival date & time: 02/15/18  0802     History   Chief Complaint Chief Complaint  Patient presents with  . Fall    DOI 02/15/18    HPI Sophia Jackson is a 57 y.o. female.   57 yo female with a c/o forehead, left knee and left index and middle finger pain since falling out of bed around midnight last night. States she was dreaming and must have been on the edge of the bed when she fell about 3 feet landing on carpet floor. States pain on the fingers is worse. Denies any vision changes, numbness/tingling.  The history is provided by the patient.  Fall     Past Medical History:  Diagnosis Date  . Acid reflux 10/27/2007  . Arthritis 07/27/2007  . Avitaminosis D 10/12/2012  . Cataract   . Depression   . Gonalgia 05/30/2011  . H/O cataract extraction 01/20/2015  . History of migraine 08/30/2015  . Hypertension   . Migraines   . Osteoarthritis   . Status post bariatric surgery 08/30/2015    Patient Active Problem List   Diagnosis Date Noted  . Chronic knee pain 08/18/2017  . Other specified health status 08/18/2017  . Pain in unspecified joint 08/18/2017  . Osteoarthritis of knee (Bilateral) (L>R) 11/12/2016  . Chronic migraine 11/11/2016  . Chronic pain syndrome 11/07/2016  . Elevated C-reactive protein (CRP) 11/24/2015  . Encounter for chronic pain management 11/22/2015  . Opiate use 08/30/2015  . Long term prescription opiate use 08/30/2015  . Long term current use of opiate analgesic 08/30/2015  . Encounter for therapeutic drug level monitoring 08/30/2015  . Knee joint pain 08/30/2015  . Status post bariatric surgery 08/30/2015  . Morbid obesity (HCC) 08/30/2015  . Osteoarthritis 08/30/2015  . Opioid use agreement exists 08/30/2015  . Myofascial pain 08/30/2015  . Muscle spasms of lower extremity 08/30/2015  . Status post cataract extraction and insertion of intraocular lens of left eye 02/10/2015  . Status post  cataract extraction and insertion of intraocular lens of right eye 01/20/2015  . Cataract, nuclear 12/26/2014  . AMD (age related macular degeneration) 12/26/2014  . 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 Posterior subcapsular polar age-related cataract of both eyes 12/26/2014  . Age-related nuclear cataract of both eyes 12/26/2014  . Iron deficiency anemia 11/15/2013  . Osteoarthritis of both knees 11/15/2013  . Vitamin D deficiency, unspecified 10/12/2012  . Hyperlipidemia 12/02/2011  . S/P gastric bypass 05/30/2011  . Common migraine 01/28/2011  . Migraine without aura 01/28/2011  . Impaired fasting glucose 05/22/2010  . Type 2 diabetes mellitus treated without insulin (HCC) 05/22/2010  . Essential hypertension 05/22/2010  . Allergic rhinitis 02/08/2009  . Esophageal reflux 10/27/2007  . Depression 07/26/2007  . Peptic ulcer 07/26/2007  . Major depressive disorder, single episode, unspecified 07/26/2007    Past Surgical History:  Procedure Laterality Date  . ABDOMINAL HYSTERECTOMY    . BARIATRIC SURGERY    . KNEE SURGERY    . TONSILLECTOMY    . WRIST SURGERY      OB History   None      Home Medications    Prior to Admission medications   Medication Sig Start Date End Date Taking? Authorizing Provider  amLODipine (NORVASC) 5 MG tablet Take 5 mg by mouth daily.   Yes [provider]  atenolol (TENORMIN) 25 MG tablet Take 25 mg by mouth daily.   Yes [provider]  Cholecalciferol (VITAMIN D) 2000 UNITS tablet Take 2,000 Units by mouth daily.   Yes [provider]  ferrous sulfate 325 (65 FE) MG EC tablet Take 325 mg by mouth daily with breakfast.   Yes [provider]  losartan-hydrochlorothiazide (HYZAAR) 100-25 MG tablet Take 1 tablet by mouth daily.   Yes [provider]  Multiple Vitamins-Minerals (MULTIVITAMIN WITH MINERALS) tablet Take 1 tablet by mouth at bedtime.    Yes [provider]  nortriptyline (PAMELOR) 50 MG  capsule Take 50 mg by mouth at bedtime.  11/11/16  Yes [provider]  omeprazole (PRILOSEC OTC) 20 MG tablet Take 20 mg by mouth daily as needed.    Yes [provider]  sertraline (ZOLOFT) 100 MG tablet Take one and 1/2 tablets (150 mg) by mouth daily 09/06/16  Yes [provider]  tiZANidine (ZANAFLEX) 4 MG tablet  09/23/17  Yes [provider]  vitamin B-12 (CYANOCOBALAMIN) 100 MCG tablet Take 500 mcg by mouth at bedtime.    Yes [provider]  diazepam (VALIUM) 5 MG tablet Take 5 mg by mouth as needed for anxiety (prior to dental work).    [provider]  HYDROcodone-acetaminophen (NORCO/VICODIN) 5-325 MG tablet 1 tab po q 8 hours prn 02/15/18   Payton Mccallum, MD  SUMAtriptan (IMITREX) 100 MG tablet Take 100 mg by mouth once. as needed for Migraine (May repeat in 2 hours. Max 2/24hours.) 04/04/16   [provider]    Family History Family History  Problem Relation Age of Onset  . Cancer Mother   . Heart disease Father     Social History Social History   Tobacco Use  . Smoking status: Never Smoker  . Smokeless tobacco: Never Used  Substance Use Topics  . Alcohol use: No    Alcohol/week: 0.0 oz  . Drug use: No     Allergies   2,4-d dimethylamine (amisol); Diphenhydramine; Diphenhydramine hcl; Ibuprofen; Morphine and related; Nsaids; Tetanus toxoids; Diclofenac; Morphine; Naproxen; and Voltaren  [diclofenac sodium]   Review of Systems Review of Systems   Physical Exam Triage Vital Signs ED Triage Vitals  Enc Vitals Group     BP 02/15/18 0813 (!) 134/102     Pulse Rate 02/15/18 0813 74     Resp 02/15/18 0813 16     Temp 02/15/18 0813 98.5 F (36.9 C)     Temp Source 02/15/18 0813 Oral     SpO2 02/15/18 0813 100 %     Weight 02/15/18 0812 240 lb (108.9 kg)     Height 02/15/18 0812 5\' 6"  (1.676 m)     Head Circumference --      Peak Flow --      Pain Score 02/15/18 0812 4     Pain Loc --      Pain Edu?  --      Excl. in GC? --    No data found.  Updated Vital Signs BP (!) 134/102 (BP Location: Right Arm)   Pulse 74   Temp 98.5 F (36.9 C) (Oral)   Resp 16   Ht 5\' 6"  (1.676 m)   Wt 240 lb (108.9 kg)   SpO2 100%   BMI 38.74 kg/m   Visual Acuity Right Eye Distance:   Left Eye Distance:   Bilateral Distance:    Right Eye Near:   Left Eye Near:    Bilateral Near:     Physical Exam  Constitutional: She appears well-developed and well-nourished. No distress.  HENT:  Head: Normocephalic.  Right Ear: Tympanic membrane, external ear and ear canal normal.  Left Ear: Tympanic membrane, external ear and ear canal normal.  Nose: Mucosal edema and rhinorrhea present. No nose lacerations, sinus tenderness, nasal deformity, septal deviation or nasal septal hematoma. No epistaxis.  No foreign bodies. Right sinus exhibits maxillary sinus tenderness and frontal sinus tenderness. Left sinus exhibits maxillary sinus tenderness and frontal sinus tenderness.  Mouth/Throat: Uvula is midline, oropharynx is clear and moist and mucous membranes are normal. No oropharyngeal exudate.  Skin abrasion and mild edema to forehead  Eyes: Pupils are equal, round, and reactive to light. Conjunctivae and EOM are normal. Right eye exhibits no discharge. Left eye exhibits no discharge. No scleral icterus.  Neck: Normal range of motion. Neck supple. No tracheal deviation present. No thyromegaly present.  Cardiovascular: Normal rate, regular rhythm and normal heart sounds.  Pulmonary/Chest: Effort normal and breath sounds normal. No stridor. No respiratory distress. She has no wheezes. She has no rales.  Musculoskeletal:       Left knee: She exhibits swelling (mild and superficial skin abrasion). She exhibits normal range of motion, no effusion, no ecchymosis, no deformity, no laceration, no erythema, normal alignment, no LCL laxity, normal patellar mobility, no bony tenderness, normal meniscus and no MCL laxity. No  tenderness found.       Left hand: She exhibits tenderness, bony tenderness (to index and middle fingers) and swelling. She exhibits normal range of motion, normal capillary refill, no deformity and no laceration. Normal sensation noted. Normal strength noted.  Lymphadenopathy:    She has no cervical adenopathy.  Skin: She is not diaphoretic.  Nursing note and vitals reviewed.    UC Treatments / Results  Labs (all labs ordered are listed, but only abnormal results are displayed) Labs Reviewed - No data to display  EKG None Radiology Dg Finger Index Left  Result Date: 02/15/2018 CLINICAL DATA:  Larey SeatFell out of bed today. Left index finger injury and pain. Initial encounter. EXAM: LEFT INDEX FINGER 2+V COMPARISON:  None. FINDINGS: A nondisplaced fracture is seen involving the radial margin of the base of the proximal phalanx. No other fractures are identified. No evidence of dislocation. Mild osteoarthritis seen involving the DIP joint. IMPRESSION: Nondisplaced fracture involving the radial margin of the base of the proximal phalanx. Electronically Signed   By: Myles RosenthalJohn  Stahl M.D.   On: 02/15/2018 09:14   Dg Finger Middle Left  Result Date: 02/15/2018 CLINICAL DATA:  Larey SeatFell out of bed today. Middle finger pain and swelling. Initial encounter. EXAM: LEFT MIDDLE FINGER 2+V COMPARISON:  Left index finger radiographs also obtained today FINDINGS: There is no evidence of fracture or dislocation. There is no evidence of arthropathy or other focal bone abnormality. Soft tissues are unremarkable. IMPRESSION: No evidence of middle finger fracture or dislocation. Electronically Signed   By: Myles RosenthalJohn  Stahl M.D.   On: 02/15/2018 09:14    Procedures Procedures (including critical care time)  Medications Ordered in UC Medications - No data to display   Initial Impression / Assessment and Plan / UC Course  I have reviewed the triage vital signs and the nursing notes.  Pertinent labs & imaging results that  were available during my care of the patient were reviewed by me and considered in my medical decision making (see chart for details).       Final Clinical Impressions(s) / UC Diagnoses   Final diagnoses:  Closed nondisplaced fracture of proximal phalanx of left index finger,  initial encounter  Contusion of left knee, initial encounter  Contusion of forehead, initial encounter    ED Discharge Orders        Ordered    HYDROcodone-acetaminophen (NORCO/VICODIN) 5-325 MG tablet     02/15/18 0938     1. x-ray results and diagnosis reviewed with patient 2. rx as per orders above; reviewed possible side effects, interactions, risks and benefits  3. Immobilized finger with radial gutter splint 4. Recommend supportive treatment with elevation, otc analgesics prn 5. Follow up with orthopedist this week 6. Follow-up prn if symptoms worsen or don't improve  Controlled Substance Prescriptions [place] Controlled Substance Registry consulted? Yes, I have consulted the [place] Controlled Substances Registry for this patient, and feel the risk/benefit ratio today is favorable for proceeding with this prescription for a controlled substance.   Payton Mccallum, MD 02/15/18 1032

## 2018-02-15 NOTE — Discharge Instructions (Signed)
Follow up with orthopedist this week °

## 2018-02-15 NOTE — ED Notes (Signed)
4" fiberglass splint applied to left hand radial gutter. PMS intact post application. Dr. Judd Gaudieronty inspects splint post application

## 2018-02-15 NOTE — ED Triage Notes (Signed)
Patient in today c/o left knee, left hand and forehead pain after falling out of bed this morning around midnight.

## 2018-05-04 ENCOUNTER — Encounter: Payer: Self-pay | Admitting: Nurse Practitioner

## 2018-05-04 ENCOUNTER — Ambulatory Visit: Payer: BC Managed Care – PPO | Attending: Nurse Practitioner | Admitting: Nurse Practitioner

## 2018-05-04 VITALS — BP 144/78 | HR 67 | Temp 98.0°F | Resp 16 | Ht 66.0 in | Wt 240.0 lb

## 2018-05-04 DIAGNOSIS — J309 Allergic rhinitis, unspecified: Secondary | ICD-10-CM | POA: Insufficient documentation

## 2018-05-04 DIAGNOSIS — Z79899 Other long term (current) drug therapy: Secondary | ICD-10-CM | POA: Insufficient documentation

## 2018-05-04 DIAGNOSIS — E119 Type 2 diabetes mellitus without complications: Secondary | ICD-10-CM | POA: Insufficient documentation

## 2018-05-04 DIAGNOSIS — M7918 Myalgia, other site: Secondary | ICD-10-CM | POA: Diagnosis not present

## 2018-05-04 DIAGNOSIS — M150 Primary generalized (osteo)arthritis: Secondary | ICD-10-CM | POA: Diagnosis not present

## 2018-05-04 DIAGNOSIS — F329 Major depressive disorder, single episode, unspecified: Secondary | ICD-10-CM | POA: Diagnosis not present

## 2018-05-04 DIAGNOSIS — G894 Chronic pain syndrome: Secondary | ICD-10-CM | POA: Insufficient documentation

## 2018-05-04 DIAGNOSIS — M25562 Pain in left knee: Secondary | ICD-10-CM | POA: Insufficient documentation

## 2018-05-04 DIAGNOSIS — M25561 Pain in right knee: Secondary | ICD-10-CM | POA: Diagnosis present

## 2018-05-04 DIAGNOSIS — E559 Vitamin D deficiency, unspecified: Secondary | ICD-10-CM | POA: Insufficient documentation

## 2018-05-04 DIAGNOSIS — H3530 Unspecified macular degeneration: Secondary | ICD-10-CM | POA: Insufficient documentation

## 2018-05-04 DIAGNOSIS — M170 Bilateral primary osteoarthritis of knee: Secondary | ICD-10-CM | POA: Insufficient documentation

## 2018-05-04 DIAGNOSIS — H25813 Combined forms of age-related cataract, bilateral: Secondary | ICD-10-CM | POA: Diagnosis not present

## 2018-05-04 DIAGNOSIS — K219 Gastro-esophageal reflux disease without esophagitis: Secondary | ICD-10-CM | POA: Insufficient documentation

## 2018-05-04 DIAGNOSIS — Z9842 Cataract extraction status, left eye: Secondary | ICD-10-CM | POA: Insufficient documentation

## 2018-05-04 DIAGNOSIS — M159 Polyosteoarthritis, unspecified: Secondary | ICD-10-CM

## 2018-05-04 DIAGNOSIS — I10 Essential (primary) hypertension: Secondary | ICD-10-CM | POA: Insufficient documentation

## 2018-05-04 DIAGNOSIS — G8929 Other chronic pain: Secondary | ICD-10-CM

## 2018-05-04 DIAGNOSIS — M79642 Pain in left hand: Secondary | ICD-10-CM | POA: Insufficient documentation

## 2018-05-04 DIAGNOSIS — Z79891 Long term (current) use of opiate analgesic: Secondary | ICD-10-CM | POA: Insufficient documentation

## 2018-05-04 DIAGNOSIS — Z8781 Personal history of (healed) traumatic fracture: Secondary | ICD-10-CM | POA: Diagnosis not present

## 2018-05-04 DIAGNOSIS — R7301 Impaired fasting glucose: Secondary | ICD-10-CM | POA: Diagnosis not present

## 2018-05-04 DIAGNOSIS — D509 Iron deficiency anemia, unspecified: Secondary | ICD-10-CM | POA: Diagnosis not present

## 2018-05-04 DIAGNOSIS — R7982 Elevated C-reactive protein (CRP): Secondary | ICD-10-CM | POA: Insufficient documentation

## 2018-05-04 DIAGNOSIS — E785 Hyperlipidemia, unspecified: Secondary | ICD-10-CM | POA: Insufficient documentation

## 2018-05-04 DIAGNOSIS — Z9884 Bariatric surgery status: Secondary | ICD-10-CM | POA: Diagnosis not present

## 2018-05-04 MED ORDER — HYDROCODONE-ACETAMINOPHEN 7.5-325 MG PO TABS: 1.0000 | ORAL_TABLET | Freq: Three times a day (TID) | ORAL | 0 refills | Status: DC | PRN

## 2018-05-04 NOTE — Patient Instructions (Addendum)
______You have been given 3 scripts for Hydrocodone today.  You have been instructed to get a UDS today and was escorted to lab.  ______________________________________________________________________________________  Medication Rules  Applies to: All patients receiving prescriptions (written or electronic).  Pharmacy of record: Pharmacy where electronic prescriptions will be sent. If written prescriptions are taken to a different pharmacy, please inform the nursing staff. The pharmacy listed in the electronic medical record should be the one where you would like electronic prescriptions to be sent.  Prescription refills: Only during scheduled appointments. Applies to both, written and electronic prescriptions.  NOTE: The following applies primarily to controlled substances (Opioid* Pain Medications).   Patient's responsibilities: 1. Pain Pills: Bring all pain pills to every appointment (except for procedure appointments). 2. Pill Bottles: Bring pills in original pharmacy bottle. Always bring newest bottle. Bring bottle, even if empty. 3. Medication refills: You are responsible for knowing and keeping track of what medications you need refilled. The day before your appointment, write a list of all prescriptions that need to be refilled. Bring that list to your appointment and give it to the admitting nurse. Prescriptions will be written only during appointments. If you forget a medication, it will not be "Called in", "Faxed", or "electronically sent". You will need to get another appointment to get these prescribed. 4. Prescription Accuracy: You are responsible for carefully inspecting your prescriptions before leaving our office. Have the discharge nurse carefully go over each prescription with you, before taking them home. Make sure that your name is accurately spelled, that your address is correct. Check the name and dose of your medication to make sure it is accurate. Check the number of pills,  and the written instructions to make sure they are clear and accurate. Make sure that you are given enough medication to last until your next medication refill appointment. 5. Taking Medication: Take medication as prescribed. Never take more pills than instructed. Never take medication more frequently than prescribed. Taking less pills or less frequently is permitted and encouraged, when it comes to controlled substances (written prescriptions).  6. Inform other Doctors: Always inform, all of your healthcare providers, of all the medications you take. 7. Pain Medication from other Providers: You are not allowed to accept any additional pain medication from any other Doctor or Healthcare provider. There are two exceptions to this rule. (see below) In the event that you require additional pain medication, you are responsible for notifying us, as stated below. 8. Medication Agreement: You are responsible for carefully reading and following our Medication Agreement. This must be signed before receiving any prescriptions from our practice. Safely store a copy of your signed Agreement. Violations to the Agreement will result in no further prescriptions. (Additional copies of our Medication Agreement are available upon request.) 9. Laws, Rules, & Regulations: All patients are expected to follow all [address] and [employer], [employer], Rules, [employer]. Ignorance of the Laws does not constitute a valid excuse. The use of any illegal substances is prohibited. 10. Adopted CDC guidelines & recommendations: Target dosing levels will be at or below 60 MME/day. Use of benzodiazepines** is not recommended.  Exceptions: There are only two exceptions to the rule of not receiving pain medications from other Healthcare Providers. 1. Exception #1 (Emergencies): In the event of an emergency (i.e.: accident requiring emergency care), you are allowed to receive additional pain medication. However, you are responsible for: As  soon as you are able, call our office [phone], at any time of the  day or night, and leave a message stating your name, the date and nature of the emergency, and the name and dose of the medication prescribed. In the event that your call is answered by a member of our staff, make sure to document and save the date, time, and the name of the person that took your information.  2. Exception #2 (Planned Surgery): In the event that you are scheduled by another doctor or dentist to have any type of surgery or procedure, you are allowed (for a period no longer than 30 days), to receive additional pain medication, for the acute post-op pain. However, in this case, you are responsible for picking up a copy of our "Post-op Pain Management for Surgeons" handout, and giving it to your surgeon or dentist. This document is available at our office, and does not require an appointment to obtain it. Simply go to our office during business hours (Monday-Thursday from 8:00 AM to 4:00 PM) (Friday 8:00 AM to 12:00 Noon) or if you have a scheduled appointment with us, prior to your surgery, and ask for it by name. In addition, you will need to provide us with your name, name of your surgeon, type of surgery, and date of procedure or surgery.  *Opioid medications include: morphine, codeine, oxycodone, oxymorphone, hydrocodone, hydromorphone, meperidine, tramadol, tapentadol, buprenorphine, fentanyl, methadone. **Benzodiazepine medications include: diazepam (Valium), alprazolam (Xanax), clonazepam (Klonopine), lorazepam (Ativan), clorazepate (Tranxene), chlordiazepoxide (Librium), estazolam (Prosom), oxazepam (Serax), temazepam (Restoril), triazolam (Halcion) (Last updated: 01/01/2018) ____________________________________________________________________________________________   BMI interpretation table: BMI level Category Range association with higher incidence of chronic pain  <18 kg/m2 Underweight   18.5-24.9 kg/m2  Ideal body weight   25-29.9 kg/m2 Overweight Increased incidence by 20%  30-34.9 kg/m2 Obese (Class I) Increased incidence by 68%  35-39.9 kg/m2 Severe obesity (Class II) Increased incidence by 136%  >40 kg/m2 Extreme obesity (Class III) Increased incidence by 254%   Patient's current BMI Ideal Body weight  Body mass index is 38.74 kg/m. Ideal body weight: 59.3 kg (130 lb 11.7 oz) Adjusted ideal body weight: 79.1 kg (174 lb 7 oz)   BMI Readings from Last 4 Encounters:  05/04/18 38.74 kg/m  02/15/18 38.74 kg/m  02/05/18 38.74 kg/m  11/17/17 38.74 kg/m   Wt Readings from Last 4 Encounters:  05/04/18 240 lb (108.9 kg)  02/15/18 240 lb (108.9 kg)  02/05/18 240 lb (108.9 kg)  11/17/17 240 lb (108.9 kg)

## 2018-05-04 NOTE — Progress Notes (Signed)
Nursing Pain Medication Assessment:  Safety precautions to be maintained throughout the outpatient stay will include: orient to surroundings, keep bed in low position, maintain call bell within reach at all times, provide assistance with transfer out of bed and ambulation.  Medication Inspection Compliance: Pill count conducted under aseptic conditions, in front of the patient. Neither the pills nor the bottle was removed from the patient's sight at any time. Once count was completed pills were immediately returned to the patient in their original bottle.  Medication: Hydrocodone/APAP Pill/Patch Count: 44 of 90 pills remain Pill/Patch Appearance: Markings consistent with prescribed medication Bottle Appearance: Standard pharmacy container. Clearly labeled. Filled Date: 06 / 17 / 2019 Last Medication intake:  Yesterday

## 2018-05-04 NOTE — Progress Notes (Signed)
Patient's Name: Sophia Jackson  MRN: [id number]  Referring Provider: [facility]  DOB: Jul 09, 1961  PCP: [facility]  DOS: 05/04/2018  Note by: Vevelyn Francois NP  Service setting: Ambulatory outpatient  Specialty: Interventional Pain Management  Location: [facility] (AMB) Pain Management Facility    Patient type: Established    Primary Reason(s) for Visit: Encounter for prescription drug management. (Level of risk: moderate)  CC: Knee Pain (bilateral) and Hand Pain (s/p fx to left hand after falling)  HPI  Sophia Jackson is a 57 y.o. year old, female patient, who comes today for a medication management evaluation. She has Opiate use; Long term prescription opiate use; Long term current use of opiate analgesic; Encounter for therapeutic drug level monitoring; Knee joint pain; Status post bariatric surgery; Morbid obesity ([place]); Osteoarthritis; Cataract, nuclear; Allergic rhinitis; AMD (age related macular degeneration); Common migraine; Cornea guttata; Depression; Hyperlipidemia; Peptic ulcer; Posterior subcapsular polar age-related cataract of both eyes; Opioid use agreement exists; Myofascial pain; Muscle spasms of lower extremity; Esophageal reflux; Encounter for chronic pain management; Vitamin D deficiency; Elevated C-reactive protein (CRP); Age-related nuclear cataract of both eyes; Chronic pain syndrome; Osteoarthritis of knee (Bilateral) (L>R); Chronic migraine; S/P gastric bypass; Impaired fasting glucose; Iron deficiency anemia; Osteoarthritis of both knees; Status post cataract extraction and insertion of intraocular lens of left eye; Status post cataract extraction and insertion of intraocular lens of right eye; Chronic knee pain; Other specified health status; Pain in unspecified joint; Major depressive disorder, single episode; Type 2 diabetes mellitus treated without insulin ([place]); Migraine without aura; and Essential hypertension on their problem list. Her primarily concern  today is the Knee Pain (bilateral) and Hand Pain (s/p fx to left hand after falling)  Pain Assessment: Location: Left, Right Knee Radiating: down into calves Onset: More than a month ago Duration: Chronic pain Quality: Sharp, Discomfort, Constant, Aching Severity: 3 /10 (subjective, self-reported pain score)  Note: Reported level is compatible with observation.                          Effect on ADL: has to use ice a couple of times per day and along with medications she can manage the pain Timing: Constant Modifying factors: ice, medications BP: (!) 144/78  HR: 67  Sophia Jackson was last scheduled for an appointment on 02/05/2018 for medication management. During today's appointment we reviewed Sophia Jackson's chronic pain status, as well as her outpatient medication regimen. She admits that she fell in April while sleeping, rolled out of bed. She admits that she was seen at [facility]. She was given additional Percocet 5/359m for the fracture and pain. However the xray does not indicate a fracture.  She is currently being treated for OA of the knees and she admits that she has occasional weakness in the right knee . She denies any falls related to this. She has daily swelling in both knees.   The patient  reports that she does not use drugs. Her body mass index is 38.74 kg/m.  Further details on both, my assessment(s), as well as the proposed treatment plan, please see below.  Controlled Substance Pharmacotherapy Assessment REMS (Risk Evaluation and Mitigation Strategy)  Analgesic:Hydrocodone/APAP 7.5/325 one every 8 hours (22.5 mg/day of hydrocodone) MME/day:22.5 mg/day.   PJanett Billow RN  05/04/2018  8:21 AM  Sign at close encounter Nursing Pain Medication Assessment:  Safety precautions to be maintained throughout the outpatient stay will  include: orient to surroundings, keep bed in low position, maintain call bell within reach at all times, provide assistance with  transfer out of bed and ambulation.  Medication Inspection Compliance: Pill count conducted under aseptic conditions, in front of the patient. Neither the pills nor the bottle was removed from the patient's sight at any time. Once count was completed pills were immediately returned to the patient in their original bottle.  Medication: Hydrocodone/APAP Pill/Patch Count: 44 of 90 pills remain Pill/Patch Appearance: Markings consistent with prescribed medication Bottle Appearance: Standard pharmacy container. Clearly labeled. Filled Date: 06 / 17 / 2019 Last Medication intake:  Yesterday   Pharmacokinetics: Liberation and absorption (onset of action): WNL Distribution (time to peak effect): WNL Metabolism and excretion (duration of action): WNL         Pharmacodynamics: Desired effects: Analgesia: Ms. Mikulski reports >50% benefit. Functional ability: Patient reports that medication allows her to accomplish basic ADLs Clinically meaningful improvement in function (CMIF): Sustained CMIF goals met Perceived effectiveness: Described as relatively effective, allowing for increase in activities of daily living (ADL) Undesirable effects: Side-effects or Adverse reactions: None reported Monitoring: [place] PMP: Online review of the past 24-monthperiod conducted. Compliant with practice rules and regulations Last UDS on record: Summary  Date Value Ref Range Status  02/05/2018 FINAL  Final    Comment:    ==================================================================== TOXASSURE SELECT 13 (MW) ==================================================================== Test                             Result       Flag       Units Drug Present and Declared for Prescription Verification   Hydrocodone                    235          EXPECTED   ng/mg creat   Dihydrocodeine                 183          EXPECTED   ng/mg creat   Norhydrocodone                 569          EXPECTED   ng/mg creat    Sources of  hydrocodone include scheduled prescription    medications. Dihydrocodeine and norhydrocodone are expected    metabolites of hydrocodone. Dihydrocodeine is also available as a    scheduled prescription medication. Drug Absent but Declared for Prescription Verification   Diazepam                       Not Detected UNEXPECTED ng/mg creat ==================================================================== Test                      Result    Flag   Units      Ref Range   Creatinine              48               mg/dL      >=20 ==================================================================== Declared Medications:  The flagging and interpretation on this report are based on the  following declared medications.  Unexpected results may arise from  inaccuracies in the declared medications.  **Note: The testing scope of this panel includes these medications:  Diazepam (Valium)  Hydrocodone (Norco)  **Note: The testing scope of this  panel does not include following  reported medications:  Acetaminophen (Norco)  Amlodipine (Norvasc)  Atenolol (Tenormin)  Cyanocobalamin  Hydrochlorothiazide (Hyzaar)  Iron (Ferrous Sulfate)  Losartan (Hyzaar)  Multivitamin  Nortriptyline (Pamelor)  Omeprazole (Prilosec)  Sertraline (Zoloft)  Sumatriptan (Imitrex)  Tizanidine (Zanaflex)  Vitamin D ==================================================================== For clinical consultation, please call [phone]. ====================================================================    UDS interpretation: Compliant          Medication Assessment Form: Reviewed. Patient indicates being compliant with therapy Treatment compliance: Compliant Risk Assessment Profile: Aberrant behavior: frequent involvement in accidents Comorbid factors increasing risk of overdose: See prior notes. No additional risks detected today Risk of substance use disorder (SUD): Low-to-Moderate Opioid Risk Tool - 05/04/18  0817      Family History of Substance Abuse   Alcohol  Negative    Illegal Drugs  Negative    Rx Drugs  Negative      Personal History of Substance Abuse   Alcohol  Negative    Illegal Drugs  Negative    Rx Drugs  Negative      Psychological Disease   Psychological Disease  Positive    ADD  Negative    OCD  Negative    Bipolar  Negative    Schizophrenia  Negative    Depression  Positive patient taking zoloft for grief/depression and states she is doing well    patient taking zoloft for grief/depression and states she is doing well      Total Score   Opioid Risk Tool Scoring  3    Opioid Risk Interpretation  Low Risk      ORT Scoring interpretation table:  Score <3 = Low Risk for SUD  Score between 4-7 = Moderate Risk for SUD  Score >8 = High Risk for Opioid Abuse   Risk Mitigation Strategies:  Patient Counseling: Covered Patient-Prescriber Agreement (PPA): Present and active  Notification to other healthcare providers: Done  Pharmacologic Plan: No change in therapy, at this time.             Laboratory Chemistry  Inflammation Markers (CRP: Acute Phase) (ESR: Chronic Phase) Lab Results  Component Value Date   CRP 12.3 (H) 08/18/2017   ESRSEDRATE 28 08/18/2017                         Rheumatology Markers No results found for: RF, ANA, LABURIC, URICUR, LYMEIGGIGMAB, LYMEABIGMQN, HLAB27                      Renal Function Markers Lab Results  Component Value Date   BUN 15 05/23/2016   CREATININE 0.97 05/23/2016   GFRAA >60 05/23/2016   GFRNONAA >60 05/23/2016                             Hepatic Function Markers Lab Results  Component Value Date   AST 24 05/23/2016   ALT 18 05/23/2016   ALBUMIN 4.1 05/23/2016   ALKPHOS 124 05/23/2016                        Electrolytes Lab Results  Component Value Date   NA 141 05/23/2016   K 4.1 05/23/2016   CL 105 05/23/2016   CALCIUM 9.4 05/23/2016   MG 2.2 05/23/2016  Neuropathy  Markers Lab Results  Component Value Date   VITAMINB12 1,285 (H) 05/23/2016                        Bone Pathology Markers Lab Results  Component Value Date   25OHVITD1 36 05/23/2016   25OHVITD2 <1.0 05/23/2016   25OHVITD3 36 05/23/2016                         Coagulation Parameters No results found for: INR, LABPROT, APTT, PLT, DDIMER                      Cardiovascular Markers No results found for: BNP, CKTOTAL, CKMB, TROPONINI, HGB, HCT                       CA Markers No results found for: CEA, CA125, LABCA2                      Note:  Labs reviewed in care everywhere  Recent Diagnostic Imaging Results  DG Finger Middle Left CLINICAL DATA:  [place] out of bed today. Middle finger pain and swelling. Initial encounter.  EXAM: LEFT MIDDLE FINGER 2+V  COMPARISON:  Left index finger radiographs also obtained today  FINDINGS: There is no evidence of fracture or dislocation. There is no evidence of arthropathy or other focal bone abnormality. Soft tissues are unremarkable.  IMPRESSION: No evidence of middle finger fracture or dislocation.  Electronically Signed   By: Earle Gell M.D.   On: 02/15/2018 09:14 DG Finger Index Left CLINICAL DATA:  [place] out of bed today. Left index finger injury and pain. Initial encounter.  EXAM: LEFT INDEX FINGER 2+V  COMPARISON:  None.  FINDINGS: A nondisplaced fracture is seen involving the radial margin of the base of the proximal phalanx. No other fractures are identified. No evidence of dislocation. Mild osteoarthritis seen involving the DIP joint.  IMPRESSION: Nondisplaced fracture involving the radial margin of the base of the proximal phalanx.  Electronically Signed   By: Earle Gell M.D.   On: 02/15/2018 09:14  Complexity Note: Imaging results reviewed. Results shared with Sophia Jackson, using Layman's terms.                         Meds   Current Outpatient Medications:  .  amLODipine (NORVASC) 5 MG tablet, Take  5 mg by mouth daily., Disp: , Rfl:  .  atenolol (TENORMIN) 25 MG tablet, Take 25 mg by mouth daily., Disp: , Rfl:  .  Cholecalciferol (VITAMIN D) 2000 UNITS tablet, Take 2,000 Units by mouth daily., Disp: , Rfl:  .  diazepam (VALIUM) 5 MG tablet, Take 5 mg by mouth as needed for anxiety (prior to dental work)., Disp: , Rfl:  .  ferrous sulfate 325 (65 FE) MG EC tablet, Take 325 mg by mouth daily with breakfast., Disp: , Rfl:  .  HYDROcodone-acetaminophen (NORCO) 7.5-325 MG tablet, Take 7.5-325 tablets by mouth every 8 (eight) hours as needed., Disp: , Rfl:  .  losartan-hydrochlorothiazide (HYZAAR) 100-25 MG tablet, Take 1 tablet by mouth daily., Disp: , Rfl:  .  Multiple Vitamins-Minerals (MULTIVITAMIN WITH MINERALS) tablet, Take 1 tablet by mouth at bedtime. , Disp: , Rfl:  .  nortriptyline (PAMELOR) 50 MG capsule, Take 50 mg by mouth at bedtime. , Disp: , Rfl:  .  omeprazole (PRILOSEC  OTC) 20 MG tablet, Take 20 mg by mouth daily as needed. , Disp: , Rfl:  .  sertraline (ZOLOFT) 100 MG tablet, Take one and 1/2 tablets (150 mg) by mouth daily, Disp: , Rfl:  .  SUMAtriptan (IMITREX) 100 MG tablet, Take 100 mg by mouth once. as needed for Migraine (May repeat in 2 hours. Max 2/24hours.), Disp: , Rfl:  .  tiZANidine (ZANAFLEX) 4 MG tablet, , Disp: , Rfl:  .  vitamin B-12 (CYANOCOBALAMIN) 100 MCG tablet, Take 500 mcg by mouth at bedtime. , Disp: , Rfl:   ROS  Constitutional: Denies any fever or chills Gastrointestinal: No reported hemesis, hematochezia, vomiting, or acute GI distress Musculoskeletal: Denies any acute onset joint swelling, redness, loss of ROM, or weakness Neurological: No reported episodes of acute onset apraxia, aphasia, dysarthria, agnosia, amnesia, paralysis, loss of coordination, or loss of consciousness  Allergies  Sophia Jackson is allergic to 2,4-d dimethylamine (amisol); diphenhydramine; diphenhydramine hcl; ibuprofen; morphine and related; nsaids; tetanus toxoids; diclofenac;  morphine; naproxen; and voltaren  [diclofenac sodium].  [place]  Drug: Sophia Jackson  reports that she does not use drugs. Alcohol:  reports that she does not drink alcohol. Tobacco:  reports that she has never smoked. She has never used smokeless tobacco. Medical:  has a past medical history of Acid reflux (10/27/2007), Arthritis (07/27/2007), Avitaminosis D (10/12/2012), Cataract, Depression, Gonalgia (05/30/2011), H/O cataract extraction (01/20/2015), History of migraine (08/30/2015), Hypertension, Migraines, Osteoarthritis, and Status post bariatric surgery (08/30/2015). Surgical: Sophia Jackson  has a past surgical history that includes Abdominal hysterectomy; Bariatric Surgery; Wrist surgery; Knee surgery; and Tonsillectomy. Family: family history includes Cancer in her mother; Heart disease in her father.  Constitutional Exam  General appearance: Well nourished, well developed, and well hydrated. In no apparent acute distress Vitals:   05/04/18 0808  BP: (!) 144/78  Pulse: 67  Resp: 16  Temp: 98 F (36.7 C)  TempSrc: Oral  SpO2: 100%  Weight: 240 lb (108.9 kg)  Height: '5\' 6"'  (1.676 m)   BMI Assessment: Estimated body mass index is 38.74 kg/m as calculated from the following:   Height as of this encounter: '5\' 6"'  (1.676 m).   Weight as of this encounter: 240 lb (108.9 kg). Psych/Mental status: Alert, oriented x 3 (person, place, & time)       Eyes: PERLA Respiratory: No evidence of acute respiratory distress  Gait & Posture Assessment  Ambulation: Unassisted Gait: Relatively normal for age and body habitus Posture: WNL   Lower Extremity Exam    Side: Right lower extremity  Side: Left lower extremity  Stability: No instability observed          Stability: No instability observed          Skin & Extremity Inspection: Skin color, temperature, and hair growth are WNL. No peripheral edema or cyanosis. No masses, redness, swelling, asymmetry, or associated skin lesions. No contractures.   Skin & Extremity Inspection: Skin color, temperature, and hair growth are WNL. No peripheral edema or cyanosis. No masses, redness, swelling, asymmetry, or associated skin lesions. No contractures.  Functional ROM: Unrestricted ROM                  Functional ROM: Unrestricted ROM                  Muscle Tone/Strength: Functionally intact. No obvious neuro-muscular anomalies detected.  Muscle Tone/Strength: Functionally intact. No obvious neuro-muscular anomalies detected.  Sensory (Neurological): Unimpaired  Sensory (Neurological): Unimpaired  Palpation:  No palpable anomalies  Palpation: No palpable anomalies   Assessment  Primary Diagnosis & Pertinent Problem List: The primary encounter diagnosis was Osteoarthritis of knee (Bilateral) (L>R). Diagnoses of Chronic pain of both knees, Osteoarthritis, Chronic pain syndrome, and Long term prescription opiate use were also pertinent to this visit.  Status Diagnosis  Controlled Controlled Controlled 1. Osteoarthritis of knee (Bilateral) (L>R)   2. Chronic pain of both knees   3. Osteoarthritis   4. Chronic pain syndrome   5. Long term prescription opiate use     Problems updated and reviewed during this visit: Problem  Osteoarthritis of knee (Bilateral) (L>R)  Vitamin D Deficiency  Major Depressive Disorder, Single Episode   Plan of Care  Pharmacotherapy (Medications Ordered): No orders of the defined types were placed in this encounter.  New Prescriptions   No medications on file   Medications administered today: Sophia Jackson had no medications administered during this visit. Lab-work, procedure(s), and/or referral(s): No orders of the defined types were placed in this encounter.  Imaging and/or referral(s): None  Interventional therapies: Interventional therapies: Planned, scheduled, and/or pending: None at this time    Considering: Bilateral intra-articular Hyalgan knee injections. Bilateral intra-articular  knee injections with Hyalgan versus genicular nerve blocks followed by radiofrequency.    Palliative PRN treatment(s): Bilateral intra-articular knee injection with steroids versus Hyalgan   Provider-requested follow-up: Return in about 3 months (around 08/04/2018) for MedMgmt with [facility]).  No future appointments. Primary Care Physician: [facility] Location[facility] Note by: Vevelyn Francois NP Date: 05/04/2018; Time: 8:44 AM  Pain Score Disclaimer: We use the NRS-11 scale. This is a self-reported, subjective measurement of pain severity with only modest accuracy. It is used primarily to identify changes within a particular patient. It must be understood that outpatient pain scales are significantly less accurate that those used for research, where they can be applied under ideal controlled circumstances with minimal exposure to variables. In reality, the score is likely to be a combination of pain intensity and pain affect, where pain affect describes the degree of emotional arousal or changes in action readiness caused by the sensory experience of pain. Factors such as social and work situation, setting, emotional state, anxiety levels, expectation, and prior pain experience may influence pain perception and show large inter-individual differences that may also be affected by time variables.  Patient instructions provided during this appointment: Patient Instructions   ____________________________________________________________________________________________  Medication Rules  Applies to: All patients receiving prescriptions (written or electronic).  Pharmacy of record: Pharmacy where electronic prescriptions will be sent. If written prescriptions are taken to a different pharmacy, please inform the nursing staff. The pharmacy listed in the electronic medical record should be the one where you would like electronic prescriptions  to be sent.  Prescription refills: Only during scheduled appointments. Applies to both, written and electronic prescriptions.  NOTE: The following applies primarily to controlled substances (Opioid* Pain Medications).   Patient's responsibilities: 1. Pain Pills: Bring all pain pills to every appointment (except for procedure appointments). 2. Pill Bottles: Bring pills in original pharmacy bottle. Always bring newest bottle. Bring bottle, even if empty. 3. Medication refills: You are responsible for knowing and keeping track of what medications you need refilled. The day before your appointment, write a list of all prescriptions that need to be refilled. Bring that list to your appointment and give it to the admitting nurse. Prescriptions will be written only during appointments. If  you forget a medication, it will not be "Called in", "Faxed", or "electronically sent". You will need to get another appointment to get these prescribed. 4. Prescription Accuracy: You are responsible for carefully inspecting your prescriptions before leaving our office. Have the discharge nurse carefully go over each prescription with you, before taking them home. Make sure that your name is accurately spelled, that your address is correct. Check the name and dose of your medication to make sure it is accurate. Check the number of pills, and the written instructions to make sure they are clear and accurate. Make sure that you are given enough medication to last until your next medication refill appointment. 5. Taking Medication: Take medication as prescribed. Never take more pills than instructed. Never take medication more frequently than prescribed. Taking less pills or less frequently is permitted and encouraged, when it comes to controlled substances (written prescriptions).  6. Inform other Doctors: Always inform, all of your healthcare providers, of all the medications you take. 7. Pain Medication from other Providers:  You are not allowed to accept any additional pain medication from any other Doctor or Healthcare provider. There are two exceptions to this rule. (see below) In the event that you require additional pain medication, you are responsible for notifying us, as stated below. 8. Medication Agreement: You are responsible for carefully reading and following our Medication Agreement. This must be signed before receiving any prescriptions from our practice. Safely store a copy of your signed Agreement. Violations to the Agreement will result in no further prescriptions. (Additional copies of our Medication Agreement are available upon request.) 9. Laws, Rules, & Regulations: All patients are expected to follow all Federal and [employer], Rules, [employer]. Ignorance of the Laws does not constitute a valid excuse. The use of any illegal substances is prohibited. 10. Adopted CDC guidelines & recommendations: Target dosing levels will be at or below 60 MME/day. Use of benzodiazepines** is not recommended.  Exceptions: There are only two exceptions to the rule of not receiving pain medications from other Healthcare Providers. 1. Exception #1 (Emergencies): In the event of an emergency (i.e.: accident requiring emergency care), you are allowed to receive additional pain medication. However, you are responsible for: As soon as you are able, call our office [phone], at any time of the day or night, and leave a message stating your name, the date and nature of the emergency, and the name and dose of the medication prescribed. In the event that your call is answered by a member of our staff, make sure to document and save the date, time, and the name of the person that took your information.  2. Exception #2 (Planned Surgery): In the event that you are scheduled by another doctor or dentist to have any type of surgery or procedure, you are allowed (for a period no longer than 30 days), to receive additional  pain medication, for the acute post-op pain. However, in this case, you are responsible for picking up a copy of our "Post-op Pain Management for Surgeons" handout, and giving it to your surgeon or dentist. This document is available at our office, and does not require an appointment to obtain it. Simply go to our office during business hours (Monday-Thursday from 8:00 AM to 4:00 PM) (Friday 8:00 AM to 12:00 Noon) or if you have a scheduled appointment with [place], prior to your surgery, and ask for it by name. In addition, you will need to provide [place] with your name,  name of your surgeon, type of surgery, and date of procedure or surgery.  *Opioid medications include: morphine, codeine, oxycodone, oxymorphone, hydrocodone, hydromorphone, meperidine, tramadol, tapentadol, buprenorphine, fentanyl, methadone. **Benzodiazepine medications include: diazepam (Valium), alprazolam (Xanax), clonazepam (Klonopine), lorazepam (Ativan), clorazepate (Tranxene), chlordiazepoxide (Librium), estazolam (Prosom), oxazepam (Serax), temazepam (Restoril), triazolam (Halcion) (Last updated: 01/01/2018) ____________________________________________________________________________________________   BMI interpretation table: BMI level Category Range association with higher incidence of chronic pain  <18 kg/m2 Underweight   18.5-24.9 kg/m2 Ideal body weight   25-29.9 kg/m2 Overweight Increased incidence by 20%  30-34.9 kg/m2 Obese (Class I) Increased incidence by 68%  35-39.9 kg/m2 Severe obesity (Class II) Increased incidence by 136%  >40 kg/m2 Extreme obesity (Class III) Increased incidence by 254%   Patient's current BMI Ideal Body weight  Body mass index is 38.74 kg/m. Ideal body weight: 59.3 kg (130 lb 11.7 oz) Adjusted ideal body weight: 79.1 kg (174 lb 7 oz)   BMI Readings from Last 4 Encounters:  05/04/18 38.74 kg/m  02/15/18 38.74 kg/m  02/05/18 38.74 kg/m  11/17/17 38.74 kg/m   Wt Readings from Last 4  Encounters:  05/04/18 240 lb (108.9 kg)  02/15/18 240 lb (108.9 kg)  02/05/18 240 lb (108.9 kg)  11/17/17 240 lb (108.9 kg)

## 2018-05-09 LAB — TOXASSURE SELECT 13 (MW), URINE

## 2018-08-04 ENCOUNTER — Other Ambulatory Visit: Payer: Self-pay

## 2018-08-04 ENCOUNTER — Encounter: Payer: Self-pay | Admitting: Nurse Practitioner

## 2018-08-04 ENCOUNTER — Ambulatory Visit: Payer: BC Managed Care – PPO | Attending: Nurse Practitioner | Admitting: Nurse Practitioner

## 2018-08-04 VITALS — BP 150/83 | HR 67 | Temp 98.7°F | Resp 16 | Ht 65.0 in | Wt 240.0 lb

## 2018-08-04 DIAGNOSIS — M25562 Pain in left knee: Secondary | ICD-10-CM

## 2018-08-04 DIAGNOSIS — Z79899 Other long term (current) drug therapy: Secondary | ICD-10-CM | POA: Diagnosis not present

## 2018-08-04 DIAGNOSIS — Z886 Allergy status to analgesic agent status: Secondary | ICD-10-CM | POA: Diagnosis not present

## 2018-08-04 DIAGNOSIS — Z79891 Long term (current) use of opiate analgesic: Secondary | ICD-10-CM | POA: Insufficient documentation

## 2018-08-04 DIAGNOSIS — H2513 Age-related nuclear cataract, bilateral: Secondary | ICD-10-CM | POA: Insufficient documentation

## 2018-08-04 DIAGNOSIS — Z8249 Family history of ischemic heart disease and other diseases of the circulatory system: Secondary | ICD-10-CM | POA: Diagnosis not present

## 2018-08-04 DIAGNOSIS — G43909 Migraine, unspecified, not intractable, without status migrainosus: Secondary | ICD-10-CM | POA: Insufficient documentation

## 2018-08-04 DIAGNOSIS — D509 Iron deficiency anemia, unspecified: Secondary | ICD-10-CM | POA: Diagnosis not present

## 2018-08-04 DIAGNOSIS — E559 Vitamin D deficiency, unspecified: Secondary | ICD-10-CM | POA: Diagnosis not present

## 2018-08-04 DIAGNOSIS — G894 Chronic pain syndrome: Secondary | ICD-10-CM | POA: Diagnosis present

## 2018-08-04 DIAGNOSIS — Z5181 Encounter for therapeutic drug level monitoring: Secondary | ICD-10-CM | POA: Insufficient documentation

## 2018-08-04 DIAGNOSIS — K219 Gastro-esophageal reflux disease without esophagitis: Secondary | ICD-10-CM | POA: Insufficient documentation

## 2018-08-04 DIAGNOSIS — M25561 Pain in right knee: Secondary | ICD-10-CM

## 2018-08-04 DIAGNOSIS — Z9884 Bariatric surgery status: Secondary | ICD-10-CM | POA: Insufficient documentation

## 2018-08-04 DIAGNOSIS — M62838 Other muscle spasm: Secondary | ICD-10-CM | POA: Insufficient documentation

## 2018-08-04 DIAGNOSIS — E785 Hyperlipidemia, unspecified: Secondary | ICD-10-CM | POA: Insufficient documentation

## 2018-08-04 DIAGNOSIS — F329 Major depressive disorder, single episode, unspecified: Secondary | ICD-10-CM | POA: Diagnosis not present

## 2018-08-04 DIAGNOSIS — Z6839 Body mass index (BMI) 39.0-39.9, adult: Secondary | ICD-10-CM | POA: Insufficient documentation

## 2018-08-04 DIAGNOSIS — Z885 Allergy status to narcotic agent status: Secondary | ICD-10-CM | POA: Diagnosis not present

## 2018-08-04 DIAGNOSIS — E1136 Type 2 diabetes mellitus with diabetic cataract: Secondary | ICD-10-CM | POA: Diagnosis not present

## 2018-08-04 DIAGNOSIS — I10 Essential (primary) hypertension: Secondary | ICD-10-CM | POA: Diagnosis not present

## 2018-08-04 DIAGNOSIS — M7918 Myalgia, other site: Secondary | ICD-10-CM

## 2018-08-04 DIAGNOSIS — M170 Bilateral primary osteoarthritis of knee: Secondary | ICD-10-CM | POA: Diagnosis not present

## 2018-08-04 MED ORDER — HYDROCODONE-ACETAMINOPHEN 7.5-325 MG PO TABS: 1.0000 | ORAL_TABLET | Freq: Three times a day (TID) | ORAL | 0 refills | Status: DC | PRN

## 2018-08-04 NOTE — Progress Notes (Signed)
Patient's Name: Sophia Jackson  MRN: [id number]  Referring Provider: [facility]  DOB: Sep 06, 1961  PCP: [facility]  DOS: 08/04/2018  Note by: Vevelyn Francois NP  Service setting: Ambulatory outpatient  Specialty: Interventional Pain Management  Location: [facility] (AMB) Pain Management Facility    Patient type: Established    Primary Reason(s) for Visit: Encounter for prescription drug management. (Level of risk: moderate)  CC: No chief complaint on file.  HPI  Sophia Jackson is a 57 y.o. year old, female patient, who comes today for a medication management evaluation. She has Opiate use; Long term prescription opiate use; Long term current use of opiate analgesic; Encounter for therapeutic drug level monitoring; Knee joint pain; Status post bariatric surgery; Morbid obesity ([place]); Osteoarthritis; Cataract, nuclear; Allergic rhinitis; AMD (age related macular degeneration); Common migraine; Cornea guttata; Depression; Hyperlipidemia; Peptic ulcer; Posterior subcapsular polar age-related cataract of both eyes; Opioid use agreement exists; Myofascial pain; Muscle spasms of lower extremity; Esophageal reflux; Encounter for chronic pain management; Vitamin D deficiency; Elevated C-reactive protein (CRP); Age-related nuclear cataract of both eyes; Chronic pain syndrome; Osteoarthritis of knee (Bilateral) (L>R); Chronic migraine; S/P gastric bypass; Impaired fasting glucose; Iron deficiency anemia; Osteoarthritis of both knees; Status post cataract extraction and insertion of intraocular lens of left eye; Status post cataract extraction and insertion of intraocular lens of right eye; Chronic knee pain; Other specified health status; Pain in unspecified joint; Major depressive disorder, single episode; Type 2 diabetes mellitus treated without insulin ([place]); Migraine without aura; and Essential hypertension on their problem list. Her primarily concern today is the No chief complaint on  file.  Pain Assessment: Location: Right, Left Knee Radiating: right run to shin, left gets weak and will almost fall Onset: More than a month ago Duration: Chronic pain Quality: Sharp, Constant, Aching, Discomfort Severity:  /10 (subjective, self-reported pain score)  Note: Reported level is compatible with observation.                         When using our objective Pain Scale, levels between 6 and 10/10 are said to belong in an emergency room, as it progressively worsens from a 6/10, described as severely limiting, requiring emergency care not usually available at an outpatient pain management facility. At a 6/10 level, communication becomes difficult and requires great effort. Assistance to reach the emergency department may be required. Facial flushing and profuse sweating along with potentially dangerous increases in heart rate and blood pressure will be evident. Effect on ADL: Prolonged walkin, prolonged standing, clean as much,bending, "I can t get in the floor" Timing: Constant Modifying factors: ice, medications BP: (!) 150/83  HR: 67  Sophia Jackson was last scheduled for an appointment on 05/04/2018 for medication management. During today's appointment we reviewed Sophia Jackson's chronic pain status, as well as her outpatient medication regimen. She is wanting a increase in medication and knee brace. She admits that she has worn braces in the past. She is aware that she needs bilateral knee replacements but is not ready at this time. She admits that she was seen by her [facility] in [place] in the past. She admits that she lives alone and has limitations with fiance.  She is rating her pain 5-6. She admits that she can not afford to have injections at this time due to the deductible.    The patient  reports that she does not use drugs. Her body mass index is  39.94 kg/m.  Further details on both, my assessment(s), as well as the proposed treatment plan, please see  below.  Controlled Substance Pharmacotherapy Assessment REMS (Risk Evaluation and Mitigation Strategy)  Analgesic:Hydrocodone/APAP 7.5/325 one every 8 hours (22.5 mg/day of hydrocodone) MME/day:22.5 mg/day. Sophia Specking, RN  08/04/2018  8:36 AM  Sign at close encounter Nursing Pain Medication Assessment:  Safety precautions to be maintained throughout the outpatient stay will include: orient to surroundings, keep bed in low position, maintain call bell within reach at all times, provide assistance with transfer out of bed and ambulation.  Medication Inspection Compliance: Pill count conducted under aseptic conditions, in front of the patient. Neither the pills nor the bottle was removed from the patient's sight at any time. Once count was completed pills were immediately returned to the patient in their original bottle.  Medication: Hydrocodone/APAP Pill/Patch Count: 56 of 90 pills remain Pill/Patch Appearance: Markings consistent with prescribed medication Bottle Appearance: Standard pharmacy container. Clearly labeled. Filled Date: 1 / 14 / 2019 Last Medication intake:  Today   Pharmacokinetics: Liberation and absorption (onset of action): WNL Distribution (time to peak effect): WNL Metabolism and excretion (duration of action): WNL         Pharmacodynamics: Desired effects: Analgesia: [name] reports >50% benefit. Functional ability: Patient reports that medication allows her to accomplish basic ADLs Clinically meaningful improvement in function (CMIF): Sustained CMIF goals met Perceived effectiveness: Described as ineffective and would like to make some changes Undesirable effects: Side-effects or Adverse reactions: None reported Monitoring: Sophia Jackson PMP: Online review of the past 52-monthperiod conducted. Compliant with practice rules and regulations Last UDS on record: Summary  Date Value Ref Range Status  05/04/2018 FINAL  Final    Comment:     ==================================================================== TOXASSURE SELECT 13 (MW) ==================================================================== Test                             Result       Flag       Units Drug Present and Declared for Prescription Verification   Hydrocodone                    445          EXPECTED   ng/mg creat   Hydromorphone                  140          EXPECTED   ng/mg creat   Dihydrocodeine                 238          EXPECTED   ng/mg creat   Norhydrocodone                 923          EXPECTED   ng/mg creat    Sources of hydrocodone include scheduled prescription    medications. Hydromorphone, dihydrocodeine and norhydrocodone are    expected metabolites of hydrocodone. Hydromorphone and    dihydrocodeine are also available as scheduled prescription    medications. Drug Absent but Declared for Prescription Verification   Diazepam                       Not Detected UNEXPECTED ng/mg creat ==================================================================== Test  Result    Flag   Units      Ref Range   Creatinine              47               mg/dL      >=20 ==================================================================== Declared Medications:  The flagging and interpretation on this report are based on the  following declared medications.  Unexpected results may arise from  inaccuracies in the declared medications.  **Note: The testing scope of this panel includes these medications:  Diazepam  Hydrocodone (Hydrocodone-Acetaminophen)  **Note: The testing scope of this panel does not include following  reported medications:  Acetaminophen (Hydrocodone-Acetaminophen)  Amlodipine  Atenolol  Cyanocobalamin  Hydrochlorothiazide (Losartan-HCTZ)  Iron (Ferrous Sulfate)  Losartan (Losartan-HCTZ)  Multivitamin (MVI)  Nortriptyline  Omeprazole  Sertraline  Sumatriptan  Tizanidine  Vitamin  D ==================================================================== For clinical consultation, please call [phone]. ====================================================================    UDS interpretation: Compliant          Medication Assessment Form: Reviewed. Patient indicates being compliant with therapy Treatment compliance: Compliant Risk Assessment Profile: Aberrant behavior: See prior evaluations. None observed or detected today Comorbid factors increasing risk of overdose: See prior notes. No additional risks detected today Opioid risk tool (ORT) (Total Score): 3 Personal History of Substance Abuse (SUD-Substance use disorder):  Alcohol: Negative  Illegal Drugs: Negative  Rx Drugs: Negative  ORT Risk Level calculation: Low Risk Risk of substance use disorder (SUD): Low Opioid Risk Tool - 08/04/18 0832      Family History of Substance Abuse   Illegal Drugs  Negative    Rx Drugs  Negative      Personal History of Substance Abuse   Alcohol  Negative    Illegal Drugs  Negative    Rx Drugs  Negative      Age   Age between 63-45 years   No      History of Preadolescent Sexual Abuse   History of Preadolescent Sexual Abuse  Negative or Female      Psychological Disease   Psychological Disease  Positive    ADD  Negative    OCD  Negative    Bipolar  Negative    Schizophrenia  Negative    Depression  Positive      Total Score   Opioid Risk Tool Scoring  3    Opioid Risk Interpretation  Low Risk      ORT Scoring interpretation table:  Score <3 = Low Risk for SUD  Score between 4-7 = Moderate Risk for SUD  Score >8 = High Risk for Opioid Abuse   Risk Mitigation Strategies:  Patient Counseling: Covered Patient-Prescriber Agreement (PPA): Present and active  Notification to other healthcare providers: Done  Pharmacologic Plan: After careful consideration, I have decided to decline Ms. Mosely's request for an increase in the daily dose of her opioid  analgesics. Instaed we will address the flare-up with interventional management techniques.  Laboratory Chemistry  Inflammation Markers (CRP: Acute Phase) (ESR: Chronic Phase) Lab Results  Component Value Date   CRP 12.3 (H) 08/18/2017   ESRSEDRATE 28 08/18/2017                         Rheumatology Markers No results found for: RF, ANA, LABURIC, URICUR, LYMEIGGIGMAB, LYMEABIGMQN, HLAB27                      Renal Function Markers Lab  Results  Component Value Date   BUN 15 05/23/2016   CREATININE 0.97 05/23/2016   GFRAA >60 05/23/2016   GFRNONAA >60 05/23/2016                             Hepatic Function Markers Lab Results  Component Value Date   AST 24 05/23/2016   ALT 18 05/23/2016   ALBUMIN 4.1 05/23/2016   ALKPHOS 124 05/23/2016                        Electrolytes Lab Results  Component Value Date   NA 141 05/23/2016   K 4.1 05/23/2016   CL 105 05/23/2016   CALCIUM 9.4 05/23/2016   MG 2.2 05/23/2016                        Neuropathy Markers Lab Results  Component Value Date   VITAMINB12 1,285 (H) 05/23/2016                        CNS Tests No results found for: COLORCSF, APPEARCSF, RBCCOUNTCSF, WBCCSF, POLYSCSF, LYMPHSCSF, EOSCSF, PROTEINCSF, GLUCCSF, JCVIRUS, CSFOLI, IGGCSF                      Bone Pathology Markers Lab Results  Component Value Date   25OHVITD1 36 05/23/2016   25OHVITD2 <1.0 05/23/2016   25OHVITD3 36 05/23/2016                         Coagulation Parameters No results found for: INR, LABPROT, APTT, PLT, DDIMER                      Cardiovascular Markers No results found for: BNP, CKTOTAL, CKMB, TROPONINI, HGB, HCT                       CA Markers No results found for: CEA, CA125, LABCA2                      Note: Lab results reviewed.  Recent Diagnostic Imaging Results  DG Finger Middle Left CLINICAL DATA:  [place] out of bed today. Middle finger pain and swelling. Initial encounter.  EXAM: LEFT MIDDLE FINGER  2+V  COMPARISON:  Left index finger radiographs also obtained today  FINDINGS: There is no evidence of fracture or dislocation. There is no evidence of arthropathy or other focal bone abnormality. Soft tissues are unremarkable.  IMPRESSION: No evidence of middle finger fracture or dislocation.  Electronically Signed   By: Earle Gell M.D.   On: 02/15/2018 09:14 DG Finger Index Left CLINICAL DATA:  [place] out of bed today. Left index finger injury and pain. Initial encounter.  EXAM: LEFT INDEX FINGER 2+V  COMPARISON:  None.  FINDINGS: A nondisplaced fracture is seen involving the radial margin of the base of the proximal phalanx. No other fractures are identified. No evidence of dislocation. Mild osteoarthritis seen involving the DIP joint.  IMPRESSION: Nondisplaced fracture involving the radial margin of the base of the proximal phalanx.  Electronically Signed   By: Earle Gell M.D.   On: 02/15/2018 09:14  Complexity Note: Imaging results reviewed. Results shared with Ms. Mel Almond, using Layman's terms.  Meds   Current Outpatient Medications:  .  amLODipine (NORVASC) 5 MG tablet, Take 5 mg by mouth daily., Disp: , Rfl:  .  atenolol (TENORMIN) 25 MG tablet, Take 25 mg by mouth daily., Disp: , Rfl:  .  Cholecalciferol (VITAMIN D) 2000 UNITS tablet, Take 2,000 Units by mouth daily., Disp: , Rfl:  .  diazepam (VALIUM) 5 MG tablet, Take 5 mg by mouth as needed for anxiety (prior to dental work)., Disp: , Rfl:  .  ferrous sulfate 325 (65 FE) MG EC tablet, Take 325 mg by mouth daily with breakfast., Disp: , Rfl:  .  [START ON 10/17/2018] HYDROcodone-acetaminophen (NORCO) 7.5-325 MG tablet, Take 1 tablet by mouth every 8 (eight) hours as needed., Disp: 90 tablet, Rfl: 0 .  losartan-hydrochlorothiazide (HYZAAR) 100-25 MG tablet, Take 1 tablet by mouth daily., Disp: , Rfl:  .  Multiple Vitamins-Minerals (MULTIVITAMIN WITH MINERALS) tablet, Take 1 tablet by  mouth at bedtime. , Disp: , Rfl:  .  nortriptyline (PAMELOR) 50 MG capsule, Take 50 mg by mouth at bedtime. , Disp: , Rfl:  .  omeprazole (PRILOSEC OTC) 20 MG tablet, Take 20 mg by mouth daily as needed. , Disp: , Rfl:  .  sertraline (ZOLOFT) 100 MG tablet, Take one and 1/2 tablets (150 mg) by mouth daily, Disp: , Rfl:  .  SUMAtriptan (IMITREX) 100 MG tablet, Take 100 mg by mouth once. as needed for Migraine (May repeat in 2 hours. Max 2/24hours.), Disp: , Rfl:  .  tiZANidine (ZANAFLEX) 4 MG tablet, , Disp: , Rfl:  .  vitamin B-12 (CYANOCOBALAMIN) 100 MCG tablet, Take 500 mcg by mouth at bedtime. , Disp: , Rfl:  .  [START ON 09/17/2018] HYDROcodone-acetaminophen (NORCO) 7.5-325 MG tablet, Take 1 tablet by mouth every 8 (eight) hours as needed for severe pain., Disp: 90 tablet, Rfl: 0 .  [START ON 08/18/2018] HYDROcodone-acetaminophen (NORCO) 7.5-325 MG tablet, Take 1 tablet by mouth every 8 (eight) hours as needed for severe pain., Disp: 90 tablet, Rfl: 0  ROS  Constitutional: Denies any fever or chills Gastrointestinal: No reported hemesis, hematochezia, vomiting, or acute GI distress Musculoskeletal: Denies any acute onset joint swelling, redness, loss of ROM, or weakness Neurological: No reported episodes of acute onset apraxia, aphasia, dysarthria, agnosia, amnesia, paralysis, loss of coordination, or loss of consciousness  Allergies  Ms. Tyrell is allergic to 2,4-d dimethylamine (amisol); diphenhydramine; diphenhydramine hcl; ibuprofen; morphine and related; nsaids; tetanus toxoids; diclofenac; morphine; naproxen; and voltaren  [diclofenac sodium].  [place]  Drug: Ms. Boye  reports that she does not use drugs. Alcohol:  reports that she does not drink alcohol. Tobacco:  reports that she has never smoked. She has never used smokeless tobacco. Medical:  has a past medical history of Acid reflux (10/27/2007), Arthritis (07/27/2007), Avitaminosis D (10/12/2012), Cataract, Depression, Gonalgia  (05/30/2011), H/O cataract extraction (01/20/2015), History of migraine (08/30/2015), Hypertension, Migraines, Osteoarthritis, and Status post bariatric surgery (08/30/2015). Surgical: Ms. Moosman  has a past surgical history that includes Abdominal hysterectomy; Bariatric Surgery; Wrist surgery; Knee surgery; and Tonsillectomy. Family: family history includes Cancer in her mother; Heart disease in her father.  Constitutional Exam  General appearance: Well nourished, well developed, and well hydrated. In no apparent acute distress Vitals:   08/04/18 0822  BP: (!) 150/83  Pulse: 67  Resp: 16  Temp: 98.7 F (37.1 C)  SpO2: 100%  Weight: 240 lb (108.9 kg)  Height: '5\' 5"'  (1.651 m)  Psych/Mental status: Alert, oriented x 3 (person,  place, & time)       Eyes: PERLA Respiratory: No evidence of acute respiratory distress  Gait & Posture Assessment  Ambulation: Unassisted Gait: Relatively normal for age and body habitus Posture: WNL   Lower Extremity Exam    Side: Right lower extremity  Side: Left lower extremity  Stability: No instability observed          Stability: No instability observed          Skin & Extremity Inspection: Skin color, temperature, and hair growth are WNL. No peripheral edema or cyanosis. No masses, redness, swelling, asymmetry, or associated skin lesions. No contractures.  Skin & Extremity Inspection: Skin color, temperature, and hair growth are WNL. No peripheral edema or cyanosis. No masses, redness, swelling, asymmetry, or associated skin lesions. No contractures.  Functional ROM: Unrestricted ROM                  Functional ROM: Unrestricted ROM                  Muscle Tone/Strength: Functionally intact. No obvious neuro-muscular anomalies detected.  Muscle Tone/Strength: Functionally intact. No obvious neuro-muscular anomalies detected.  Sensory (Neurological): Unimpaired  Sensory (Neurological): Unimpaired  Palpation: No palpable anomalies  Palpation: No palpable  anomalies   Assessment  Primary Diagnosis & Pertinent Problem List: The primary encounter diagnosis was Arthralgia of both knees. Diagnoses of Myofascial pain, Muscle spasms of lower extremity, unspecified laterality, and Chronic pain syndrome were also pertinent to this visit.  Status Diagnosis  Persistent Persistent Persistent 1. Arthralgia of both knees   2. Myofascial pain   3. Muscle spasms of lower extremity, unspecified laterality   4. Chronic pain syndrome     Problems updated and reviewed during this visit: No problems updated. Plan of Care  Pharmacotherapy (Medications Ordered): Meds ordered this encounter  Medications  . HYDROcodone-acetaminophen (NORCO) 7.5-325 MG tablet    Sig: Take 1 tablet by mouth every 8 (eight) hours as needed.    Dispense:  90 tablet    Refill:  0    Do not add this medication to the electronic "Automatic Refill" notification system. Patient may have prescription filled one day early if pharmacy is closed on scheduled refill date.    Order Specific Question:   Supervising Provider    Answer:   Milinda Pointer [phone]  . HYDROcodone-acetaminophen (NORCO) 7.5-325 MG tablet    Sig: Take 1 tablet by mouth every 8 (eight) hours as needed for severe pain.    Dispense:  90 tablet    Refill:  0    Do not place this medication, or any other prescription from our practice, on "Automatic Refill". Patient may have prescription filled one day early if pharmacy is closed on scheduled refill date.    Order Specific Question:   Supervising Provider    Answer:   Milinda Pointer [phone]  . HYDROcodone-acetaminophen (NORCO) 7.5-325 MG tablet    Sig: Take 1 tablet by mouth every 8 (eight) hours as needed for severe pain.    Dispense:  90 tablet    Refill:  0    Do not place this medication, or any other prescription from our practice, on "Automatic Refill". Patient may have prescription filled one day early if pharmacy is closed on scheduled refill date.     Order Specific Question:   Supervising Provider    Answer:   Milinda Pointer [[id number]]   New Prescriptions   No medications on file   Medications administered  today: Dede Query. Philipps had no medications administered during this visit. Lab-work, procedure(s), and/or referral(s): No orders of the defined types were placed in this encounter.  Imaging and/or referral(s): None  Interventional therapies: Planned, scheduled, and/or pending: None at this time    Considering: Bilateral intra-articular Hyalgan knee injections. Bilateral intra-articular knee injections with Hyalgan versus genicular nerve blocks followed by radiofrequency.    Palliative PRN treatment(s): Bilateral intra-articular knee injection with steroids versus Hyalgan    Provider-requested follow-up: Return in about 3 months (around 11/04/2018) for MedMgmt.  Future Appointments  Date Time Provider [place]  11/05/2018  8:30 AM Vevelyn Francois, NP [facility] None   Primary Care Physician: [facility] Location: [facility] Outpatient Pain Management Facility Note by: Vevelyn Francois NP Date: 08/04/2018; Time: 10:49 AM  Pain Score Disclaimer: We use the NRS-11 scale. This is a self-reported, subjective measurement of pain severity with only modest accuracy. It is used primarily to identify changes within a particular patient. It must be understood that outpatient pain scales are significantly less accurate that those used for research, where they can be applied under ideal controlled circumstances with minimal exposure to variables. In reality, the score is likely to be a combination of pain intensity and pain affect, where pain affect describes the degree of emotional arousal or changes in action readiness caused by the sensory experience of pain. Factors such as social and work situation, setting, emotional state, anxiety levels, expectation, and prior pain experience may influence pain  perception and show large inter-individual differences that may also be affected by time variables.  Patient instructions provided during this appointment: Patient Instructions   ____________________________________________________________________________________________  Medication Rules  Applies to: All patients receiving prescriptions (written or electronic).  Pharmacy of record: Pharmacy where electronic prescriptions will be sent. If written prescriptions are taken to a different pharmacy, please inform the nursing staff. The pharmacy listed in the electronic medical record should be the one where you would like electronic prescriptions to be sent.  Prescription refills: Only during scheduled appointments. Applies to both, written and electronic prescriptions.  NOTE: The following applies primarily to controlled substances (Opioid* Pain Medications).   Patient's responsibilities: 1. Pain Pills: Bring all pain pills to every appointment (except for procedure appointments). 2. Pill Bottles: Bring pills in original pharmacy bottle. Always bring newest bottle. Bring bottle, even if empty. 3. Medication refills: You are responsible for knowing and keeping track of what medications you need refilled. The day before your appointment, write a list of all prescriptions that need to be refilled. Bring that list to your appointment and give it to the admitting nurse. Prescriptions will be written only during appointments. If you forget a medication, it will not be "Called in", "Faxed", or "electronically sent". You will need to get another appointment to get these prescribed. 4. Prescription Accuracy: You are responsible for carefully inspecting your prescriptions before leaving our office. Have the discharge nurse carefully go over each prescription with you, before taking them home. Make sure that your name is accurately spelled, that your address is correct. Check the name and dose of your  medication to make sure it is accurate. Check the number of pills, and the written instructions to make sure they are clear and accurate. Make sure that you are given enough medication to last until your next medication refill appointment. 5. Taking Medication: Take medication as prescribed. Never take more pills than instructed. Never take medication more frequently than prescribed. Taking less pills  or less frequently is permitted and encouraged, when it comes to controlled substances (written prescriptions).  6. Inform other Doctors: Always inform, all of your healthcare providers, of all the medications you take. 7. Pain Medication from other Providers: You are not allowed to accept any additional pain medication from any other Doctor or Healthcare provider. There are two exceptions to this rule. (see below) In the event that you require additional pain medication, you are responsible for notifying us, as stated below. 8. Medication Agreement: You are responsible for carefully reading and following our Medication Agreement. This must be signed before receiving any prescriptions from our practice. Safely store a copy of your signed Agreement. Violations to the Agreement will result in no further prescriptions. (Additional copies of our Medication Agreement are available upon request.) 9. Laws, Rules, & Regulations: All patients are expected to follow all Federal and [employer], [employer], Rules, [employer]. Ignorance of the Laws does not constitute a valid excuse. The use of any illegal substances is prohibited. 10. Adopted CDC guidelines & recommendations: Target dosing levels will be at or below 60 MME/day. Use of benzodiazepines** is not recommended.  Exceptions: There are only two exceptions to the rule of not receiving pain medications from other Healthcare Providers. 1. Exception #1 (Emergencies): In the event of an emergency (i.e.: accident requiring emergency care), you are allowed to receive  additional pain medication. However, you are responsible for: As soon as you are able, call our office [phone], at any time of the day or night, and leave a message stating your name, the date and nature of the emergency, and the name and dose of the medication prescribed. In the event that your call is answered by a member of our staff, make sure to document and save the date, time, and the name of the person that took your information.  2. Exception #2 (Planned Surgery): In the event that you are scheduled by another doctor or dentist to have any type of surgery or procedure, you are allowed (for a period no longer than 30 days), to receive additional pain medication, for the acute post-op pain. However, in this case, you are responsible for picking up a copy of our "Post-op Pain Management for Surgeons" handout, and giving it to your surgeon or dentist. This document is available at our office, and does not require an appointment to obtain it. Simply go to our office during business hours (Monday-Thursday from 8:00 AM to 4:00 PM) (Friday 8:00 AM to 12:00 Noon) or if you have a scheduled appointment with [place], prior to your surgery, and ask for it by name. In addition, you will need to provide [place] with your name, name of your surgeon, type of surgery, and date of procedure or surgery.  *Opioid medications include: morphine, codeine, oxycodone, oxymorphone, hydrocodone, hydromorphone, meperidine, tramadol, tapentadol, buprenorphine, fentanyl, methadone. **Benzodiazepine medications include: diazepam (Valium), alprazolam (Xanax), clonazepam (Klonopine), lorazepam (Ativan), clorazepate (Tranxene), chlordiazepoxide (Librium), estazolam (Prosom), oxazepam (Serax), temazepam (Restoril), triazolam (Halcion) (Last updated: 01/01/2018) ____________________________________________________________________________________________   BMI Assessment: Estimated body mass index is 39.94 kg/m as calculated from the  following:   Height as of this encounter: '5\' 5"'  (1.651 m).   Weight as of this encounter: 240 lb (108.9 kg).  BMI interpretation table: BMI level Category Range association with higher incidence of chronic pain  <18 kg/m2 Underweight   18.5-24.9 kg/m2 Ideal body weight   25-29.9 kg/m2 Overweight Increased incidence by 20%  30-34.9 kg/m2 Obese (Class I) Increased incidence by  68%  35-39.9 kg/m2 Severe obesity (Class II) Increased incidence by 136%  >40 kg/m2 Extreme obesity (Class III) Increased incidence by 254%   Patient's current BMI Ideal Body weight  Body mass index is 39.94 kg/m. Ideal body weight: 57 kg (125 lb 10.6 oz) Adjusted ideal body weight: 77.7 kg (171 lb 6.4 oz)   BMI Readings from Last 4 Encounters:  08/04/18 39.94 kg/m  05/04/18 38.74 kg/m  02/15/18 38.74 kg/m  02/05/18 38.74 kg/m   Wt Readings from Last 4 Encounters:  08/04/18 240 lb (108.9 kg)  05/04/18 240 lb (108.9 kg)  02/15/18 240 lb (108.9 kg)  02/05/18 240 lb (108.9 kg)   RX for bilateral knee braces given

## 2018-08-04 NOTE — Patient Instructions (Addendum)
____________________________________________________________________________________________  Medication Rules  Applies to: All patients receiving prescriptions (written or electronic).  Pharmacy of record: Pharmacy where electronic prescriptions will be sent. If written prescriptions are taken to a different pharmacy, please inform the nursing staff. The pharmacy listed in the electronic medical record should be the one where you would like electronic prescriptions to be sent.  Prescription refills: Only during scheduled appointments. Applies to both, written and electronic prescriptions.  NOTE: The following applies primarily to controlled substances (Opioid* Pain Medications).   Patient's responsibilities: 1. Pain Pills: Bring all pain pills to every appointment (except for procedure appointments). 2. Pill Bottles: Bring pills in original pharmacy bottle. Always bring newest bottle. Bring bottle, even if empty. 3. Medication refills: You are responsible for knowing and keeping track of what medications you need refilled. The day before your appointment, write a list of all prescriptions that need to be refilled. Bring that list to your appointment and give it to the admitting nurse. Prescriptions will be written only during appointments. If you forget a medication, it will not be "Called in", "Faxed", or "electronically sent". You will need to get another appointment to get these prescribed. 4. Prescription Accuracy: You are responsible for carefully inspecting your prescriptions before leaving our office. Have the discharge nurse carefully go over each prescription with you, before taking them home. Make sure that your name is accurately spelled, that your address is correct. Check the name and dose of your medication to make sure it is accurate. Check the number of pills, and the written instructions to make sure they are clear and accurate. Make sure that you are given enough medication to last  until your next medication refill appointment. 5. Taking Medication: Take medication as prescribed. Never take more pills than instructed. Never take medication more frequently than prescribed. Taking less pills or less frequently is permitted and encouraged, when it comes to controlled substances (written prescriptions).  6. Inform other Doctors: Always inform, all of your healthcare providers, of all the medications you take. 7. Pain Medication from other Providers: You are not allowed to accept any additional pain medication from any other Doctor or Healthcare provider. There are two exceptions to this rule. (see below) In the event that you require additional pain medication, you are responsible for notifying us, as stated below. 8. Medication Agreement: You are responsible for carefully reading and following our Medication Agreement. This must be signed before receiving any prescriptions from our practice. Safely store a copy of your signed Agreement. Violations to the Agreement will result in no further prescriptions. (Additional copies of our Medication Agreement are available upon request.) 9. Laws, Rules, & Regulations: All patients are expected to follow all Federal and State Laws, Statutes, Rules, & Regulations. Ignorance of the Laws does not constitute a valid excuse. The use of any illegal substances is prohibited. 10. Adopted CDC guidelines & recommendations: Target dosing levels will be at or below 60 MME/day. Use of benzodiazepines** is not recommended.  Exceptions: There are only two exceptions to the rule of not receiving pain medications from other Healthcare Providers. 1. Exception #1 (Emergencies): In the event of an emergency (i.e.: accident requiring emergency care), you are allowed to receive additional pain medication. However, you are responsible for: As soon as you are able, call our office [phone], at any time of the day or night, and leave a message stating your name, the  date and nature of the emergency, and the name and dose of the medication   prescribed. In the event that your call is answered by a member of our staff, make sure to document and save the date, time, and the name of the person that took your information.  2. Exception #2 (Planned Surgery): In the event that you are scheduled by another doctor or dentist to have any type of surgery or procedure, you are allowed (for a period no longer than 30 days), to receive additional pain medication, for the acute post-op pain. However, in this case, you are responsible for picking up a copy of our "Post-op Pain Management for Surgeons" handout, and giving it to your surgeon or dentist. This document is available at our office, and does not require an appointment to obtain it. Simply go to our office during business hours (Monday-Thursday from 8:00 AM to 4:00 PM) (Friday 8:00 AM to 12:00 Noon) or if you have a scheduled appointment with [place], prior to your surgery, and ask for it by name. In addition, you will need to provide [place] with your name, name of your surgeon, type of surgery, and date of procedure or surgery.  *Opioid medications include: morphine, codeine, oxycodone, oxymorphone, hydrocodone, hydromorphone, meperidine, tramadol, tapentadol, buprenorphine, fentanyl, methadone. **Benzodiazepine medications include: diazepam (Valium), alprazolam (Xanax), clonazepam (Klonopine), lorazepam (Ativan), clorazepate (Tranxene), chlordiazepoxide (Librium), estazolam (Prosom), oxazepam (Serax), temazepam (Restoril), triazolam (Halcion) (Last updated: 01/01/2018) ____________________________________________________________________________________________   BMI Assessment: Estimated body mass index is 39.94 kg/m as calculated from the following:   Height as of this encounter: 5\' 5"  (1.651 m).   Weight as of this encounter: 240 lb (108.9 kg).  BMI interpretation table: BMI level Category Range association with higher  incidence of chronic pain  <18 kg/m2 Underweight   18.5-24.9 kg/m2 Ideal body weight   25-29.9 kg/m2 Overweight Increased incidence by 20%  30-34.9 kg/m2 Obese (Class I) Increased incidence by 68%  35-39.9 kg/m2 Severe obesity (Class II) Increased incidence by 136%  >40 kg/m2 Extreme obesity (Class III) Increased incidence by 254%   Patient's current BMI Ideal Body weight  Body mass index is 39.94 kg/m. Ideal body weight: 57 kg (125 lb 10.6 oz) Adjusted ideal body weight: 77.7 kg (171 lb 6.4 oz)   BMI Readings from Last 4 Encounters:  08/04/18 39.94 kg/m  05/04/18 38.74 kg/m  02/15/18 38.74 kg/m  02/05/18 38.74 kg/m   Wt Readings from Last 4 Encounters:  08/04/18 240 lb (108.9 kg)  05/04/18 240 lb (108.9 kg)  02/15/18 240 lb (108.9 kg)  02/05/18 240 lb (108.9 kg)   RX for bilateral knee braces given

## 2018-08-04 NOTE — Progress Notes (Signed)
Nursing Pain Medication Assessment:  Safety precautions to be maintained throughout the outpatient stay will include: orient to surroundings, keep bed in low position, maintain call bell within reach at all times, provide assistance with transfer out of bed and ambulation.  Medication Inspection Compliance: Pill count conducted under aseptic conditions, in front of the patient. Neither the pills nor the bottle was removed from the patient's sight at any time. Once count was completed pills were immediately returned to the patient in their original bottle.  Medication: Hydrocodone/APAP Pill/Patch Count: 56 of 90 pills remain Pill/Patch Appearance: Markings consistent with prescribed medication Bottle Appearance: Standard pharmacy container. Clearly labeled. Filled Date: 54 / 14 / 2019 Last Medication intake:  Today

## 2018-11-05 ENCOUNTER — Ambulatory Visit: Payer: BC Managed Care – PPO | Attending: Nurse Practitioner | Admitting: Nurse Practitioner

## 2018-11-05 ENCOUNTER — Other Ambulatory Visit: Payer: Self-pay

## 2018-11-05 ENCOUNTER — Encounter: Payer: Self-pay | Admitting: Nurse Practitioner

## 2018-11-05 VITALS — BP 155/88 | HR 68 | Temp 98.4°F | Resp 14 | Ht 66.0 in | Wt 245.0 lb

## 2018-11-05 DIAGNOSIS — I10 Essential (primary) hypertension: Secondary | ICD-10-CM | POA: Diagnosis not present

## 2018-11-05 DIAGNOSIS — Z8249 Family history of ischemic heart disease and other diseases of the circulatory system: Secondary | ICD-10-CM | POA: Diagnosis not present

## 2018-11-05 DIAGNOSIS — K219 Gastro-esophageal reflux disease without esophagitis: Secondary | ICD-10-CM | POA: Diagnosis not present

## 2018-11-05 DIAGNOSIS — M170 Bilateral primary osteoarthritis of knee: Secondary | ICD-10-CM | POA: Insufficient documentation

## 2018-11-05 DIAGNOSIS — Z9884 Bariatric surgery status: Secondary | ICD-10-CM | POA: Insufficient documentation

## 2018-11-05 DIAGNOSIS — E119 Type 2 diabetes mellitus without complications: Secondary | ICD-10-CM | POA: Insufficient documentation

## 2018-11-05 DIAGNOSIS — G894 Chronic pain syndrome: Secondary | ICD-10-CM | POA: Diagnosis not present

## 2018-11-05 DIAGNOSIS — E559 Vitamin D deficiency, unspecified: Secondary | ICD-10-CM | POA: Insufficient documentation

## 2018-11-05 DIAGNOSIS — M25561 Pain in right knee: Secondary | ICD-10-CM | POA: Diagnosis not present

## 2018-11-05 DIAGNOSIS — D509 Iron deficiency anemia, unspecified: Secondary | ICD-10-CM | POA: Diagnosis not present

## 2018-11-05 DIAGNOSIS — Z886 Allergy status to analgesic agent status: Secondary | ICD-10-CM | POA: Insufficient documentation

## 2018-11-05 DIAGNOSIS — M7918 Myalgia, other site: Secondary | ICD-10-CM | POA: Insufficient documentation

## 2018-11-05 DIAGNOSIS — Z79891 Long term (current) use of opiate analgesic: Secondary | ICD-10-CM | POA: Diagnosis not present

## 2018-11-05 DIAGNOSIS — Z6839 Body mass index (BMI) 39.0-39.9, adult: Secondary | ICD-10-CM | POA: Diagnosis not present

## 2018-11-05 DIAGNOSIS — Z887 Allergy status to serum and vaccine status: Secondary | ICD-10-CM | POA: Diagnosis not present

## 2018-11-05 DIAGNOSIS — M25562 Pain in left knee: Secondary | ICD-10-CM

## 2018-11-05 DIAGNOSIS — Z885 Allergy status to narcotic agent status: Secondary | ICD-10-CM | POA: Insufficient documentation

## 2018-11-05 DIAGNOSIS — Z79899 Other long term (current) drug therapy: Secondary | ICD-10-CM | POA: Insufficient documentation

## 2018-11-05 MED ORDER — HYDROCODONE-ACETAMINOPHEN 7.5-325 MG PO TABS: 1.0000 | ORAL_TABLET | Freq: Three times a day (TID) | ORAL | 0 refills | Status: DC | PRN

## 2018-11-05 NOTE — Progress Notes (Signed)
Patient's Name: Sophia Jackson  MRN: [id number]  Referring Provider: [facility]  DOB: 03-23-1961  PCP: [facility]  DOS: 11/05/2018  Note by: Sophia Francois NP  Service setting: Ambulatory outpatient  Specialty: Interventional Pain Management  Location: [facility] (AMB) Pain Management Facility    Patient type: Established    Primary Reason(s) for Visit: Encounter for prescription drug management. (Level of risk: moderate)  CC: Knee Pain (bilateral)  HPI  [name] is a 58 y.o. year old, female patient, who comes today for a medication management evaluation. She has Opiate use; Long term prescription opiate use; Long term current use of opiate analgesic; Encounter for therapeutic drug level monitoring; Knee joint pain; Status post bariatric surgery; Morbid obesity ([place]); Osteoarthritis; Cataract, nuclear; Allergic rhinitis; AMD (age related macular degeneration); Common migraine; Cornea guttata; Depression; Hyperlipidemia; Peptic ulcer; Posterior subcapsular polar age-related cataract of both eyes; Opioid use agreement exists; Myofascial pain; Muscle spasms of lower extremity; Esophageal reflux; Encounter for chronic pain management; Vitamin D deficiency; Elevated C-reactive protein (CRP); Age-related nuclear cataract of both eyes; Chronic pain syndrome; Osteoarthritis of knee (Bilateral) (L>R); Chronic migraine; S/P gastric bypass; Impaired fasting glucose; Iron deficiency anemia; Osteoarthritis of both knees; Status post cataract extraction and insertion of intraocular lens of left eye; Status post cataract extraction and insertion of intraocular lens of right eye; Chronic knee pain; Other specified health status; Pain in unspecified joint; Major depressive disorder, single episode; Type 2 diabetes mellitus treated without insulin ([place]); Migraine without aura; and Essential hypertension on their problem list. Her primarily concern today is the Knee Pain (bilateral)  Pain  Assessment: Location: Right, Left Knee Radiating: denies Onset: More than a month ago Duration: Chronic pain Quality: Sharp(intense) Severity: 8 /10 (subjective, self-reported pain score)  Note: Reported level is compatible with observation.                          Effect on ADL:   Timing: Intermittent Modifying factors: rest, ice, medications BP: (!) 155/88  HR: 68  [name] was last scheduled for an appointment on 08/04/2018 for medication management. During today's appointment we reviewed [name]'s chronic pain status, as well as her outpatient medication regimen. She admits that she that she has some knee weakness and giving away on the right. This was worse on Dec. 25. She admits that she is going to work on having this replaced. She has not obtained her new knee braces yet. She has misplaced her previous prescription. She admits that she did have a dog to make her fall in November. She has been off work for several weeks and this has made her pain better. She is hopeful for a better year. Her sister-in-law passed recently.   The patient  reports no history of drug use. Her body mass index is 39.54 kg/m.  Further details on both, my assessment(s), as well as the proposed treatment plan, please see below.  Controlled Substance Pharmacotherapy Assessment REMS (Risk Evaluation and Mitigation Strategy)  Analgesic:Hydrocodone/APAP 7.5/325 one every 8 hours (22.5 mg/day of hydrocodone) MME/day:22.5 mg/day. Sophia Martins, RN  11/05/2018  8:24 AM  Sign when Signing Visit Nursing Pain Medication Assessment:  Safety precautions to be maintained throughout the outpatient stay will include: orient to surroundings, keep bed in low position, maintain call bell within reach at all times, provide assistance with transfer out of bed and ambulation.  Medication Inspection Compliance: Pill count conducted under aseptic  conditions, in front of the patient. Neither the pills nor the bottle was  removed from the patient's sight at any time. Once count was completed pills were immediately returned to the patient in their original bottle.  Medication: Hydrocodone/APAP Pill/Patch Count: 32 of 90 pills remain Pill/Patch Appearance: Markings consistent with prescribed medication Bottle Appearance: Standard pharmacy container. Clearly labeled. Filled Date:12/14/ 2019 Last Medication intake:  Today   Pharmacokinetics: Liberation and absorption (onset of action): WNL Distribution (time to peak effect): WNL Metabolism and excretion (duration of action): WNL         Pharmacodynamics: Desired effects: Analgesia: [name] reports >50% benefit. Functional ability: Patient reports that medication allows her to accomplish basic ADLs Clinically meaningful improvement in function (CMIF): Sustained CMIF goals met Perceived effectiveness: Described as relatively effective, allowing for increase in activities of daily living (ADL) Undesirable effects: Side-effects or Adverse reactions: None reported Monitoring: [place] PMP: Online review of the past 48-monthperiod conducted. Compliant with practice rules and regulations Last UDS on record: Summary  Date Value Ref Range Status  05/04/2018 FINAL  Final    Comment:    ==================================================================== TOXASSURE SELECT 13 (MW) ==================================================================== Test                             Result       Flag       Units Drug Present and Declared for Prescription Verification   Hydrocodone                    445          EXPECTED   ng/mg creat   Hydromorphone                  140          EXPECTED   ng/mg creat   Dihydrocodeine                 238          EXPECTED   ng/mg creat   Norhydrocodone                 923          EXPECTED   ng/mg creat    Sources of hydrocodone include scheduled prescription    medications. Hydromorphone, dihydrocodeine and norhydrocodone are     expected metabolites of hydrocodone. Hydromorphone and    dihydrocodeine are also available as scheduled prescription    medications. Drug Absent but Declared for Prescription Verification   Diazepam                       Not Detected UNEXPECTED ng/mg creat ==================================================================== Test                      Result    Flag   Units      Ref Range   Creatinine              47               mg/dL      >=20 ==================================================================== Declared Medications:  The flagging and interpretation on this report are based on the  following declared medications.  Unexpected results may arise from  inaccuracies in the declared medications.  **Note: The testing scope of this panel includes these medications:  Diazepam  Hydrocodone (Hydrocodone-Acetaminophen)  **Note: The testing scope of this  panel does not include following  reported medications:  Acetaminophen (Hydrocodone-Acetaminophen)  Amlodipine  Atenolol  Cyanocobalamin  Hydrochlorothiazide (Losartan-HCTZ)  Iron (Ferrous Sulfate)  Losartan (Losartan-HCTZ)  Multivitamin (MVI)  Nortriptyline  Omeprazole  Sertraline  Sumatriptan  Tizanidine  Vitamin D ==================================================================== For clinical consultation, please call [phone]. ====================================================================    UDS interpretation: Compliant          Medication Assessment Form: Reviewed. Patient indicates being compliant with therapy Treatment compliance: Compliant Risk Assessment Profile: Aberrant behavior: See prior evaluations. None observed or detected today Comorbid factors increasing risk of overdose: See prior notes. No additional risks detected today Opioid risk tool (ORT) (Total Score): 1 Personal History of Substance Abuse (SUD-Substance use disorder):  Alcohol: Negative  Illegal Drugs: Negative  Rx Drugs:  Negative  ORT Risk Level calculation: Low Risk Risk of substance use disorder (SUD): Low Opioid Risk Tool - 11/05/18 0824      Family History of Substance Abuse   Alcohol  Negative    Illegal Drugs  Negative    Rx Drugs  Negative      Personal History of Substance Abuse   Alcohol  Negative    Illegal Drugs  Negative    Rx Drugs  Negative      Age   Age between 72-45 years   No      Psychological Disease   Psychological Disease  Negative    Depression  Positive      Total Score   Opioid Risk Tool Scoring  1    Opioid Risk Interpretation  Low Risk      ORT Scoring interpretation table:  Score <3 = Low Risk for SUD  Score between 4-7 = Moderate Risk for SUD  Score >8 = High Risk for Opioid Abuse   Risk Mitigation Strategies:  Patient Counseling: Covered Patient-Prescriber Agreement (PPA): Present and active  Notification to other healthcare providers: Done  Pharmacologic Plan: No change in therapy, at this time.             Laboratory Chemistry  Inflammation Markers (CRP: Acute Phase) (ESR: Chronic Phase) Lab Results  Component Value Date   CRP 12.3 (H) 08/18/2017   ESRSEDRATE 28 08/18/2017                         Rheumatology Markers No results found for: RF, ANA, LABURIC, URICUR, LYMEIGGIGMAB, LYMEABIGMQN, HLAB27                      Renal Function Markers Lab Results  Component Value Date   BUN 15 05/23/2016   CREATININE 0.97 05/23/2016   GFRAA >60 05/23/2016   GFRNONAA >60 05/23/2016                             Hepatic Function Markers Lab Results  Component Value Date   AST 24 05/23/2016   ALT 18 05/23/2016   ALBUMIN 4.1 05/23/2016   ALKPHOS 124 05/23/2016                        Electrolytes Lab Results  Component Value Date   NA 141 05/23/2016   K 4.1 05/23/2016   CL 105 05/23/2016   CALCIUM 9.4 05/23/2016   MG 2.2 05/23/2016                        Neuropathy  Markers Lab Results  Component Value Date   JGOTLXBW62 0,355 (H)  05/23/2016                        CNS Tests No results found for: COLORCSF, APPEARCSF, RBCCOUNTCSF, WBCCSF, POLYSCSF, LYMPHSCSF, EOSCSF, PROTEINCSF, GLUCCSF, JCVIRUS, CSFOLI, IGGCSF                      Bone Pathology Markers Lab Results  Component Value Date   25OHVITD1 36 05/23/2016   25OHVITD2 <1.0 05/23/2016   25OHVITD3 36 05/23/2016                         Coagulation Parameters No results found for: INR, LABPROT, APTT, PLT, DDIMER, LABHEMA, VITAMINK1                      Cardiovascular Markers No results found for: BNP, CKTOTAL, CKMB, TROPONINI, HGB, HCT                       CA Markers No results found for: CEA, CA125, LABCA2                      Note: Lab results reviewed.  Recent Diagnostic Imaging Results  DG Finger Middle Left CLINICAL DATA:  [place] out of bed today. Middle finger pain and swelling. Initial encounter.  EXAM: LEFT MIDDLE FINGER 2+V  COMPARISON:  Left index finger radiographs also obtained today  FINDINGS: There is no evidence of fracture or dislocation. There is no evidence of arthropathy or other focal bone abnormality. Soft tissues are unremarkable.  IMPRESSION: No evidence of middle finger fracture or dislocation.  Electronically Signed   By: Earle Gell M.D.   On: 02/15/2018 09:14 DG Finger Index Left CLINICAL DATA:  [place] out of bed today. Left index finger injury and pain. Initial encounter.  EXAM: LEFT INDEX FINGER 2+V  COMPARISON:  None.  FINDINGS: A nondisplaced fracture is seen involving the radial margin of the base of the proximal phalanx. No other fractures are identified. No evidence of dislocation. Mild osteoarthritis seen involving the DIP joint.  IMPRESSION: Nondisplaced fracture involving the radial margin of the base of the proximal phalanx.  Electronically Signed   By: Earle Gell M.D.   On: 02/15/2018 09:14  Complexity Note: Imaging results reviewed. Results shared with Ms. Mel Almond, using Layman's terms.                          Meds   Current Outpatient Medications:  .  amLODipine (NORVASC) 5 MG tablet, Take 5 mg by mouth daily., Disp: , Rfl:  .  atenolol (TENORMIN) 25 MG tablet, Take 25 mg by mouth daily., Disp: , Rfl:  .  Cholecalciferol (VITAMIN D) 2000 UNITS tablet, Take 2,000 Units by mouth daily., Disp: , Rfl:  .  diazepam (VALIUM) 5 MG tablet, Take 5 mg by mouth as needed for anxiety (prior to dental work)., Disp: , Rfl:  .  ferrous sulfate 325 (65 FE) MG EC tablet, Take 325 mg by mouth daily with breakfast., Disp: , Rfl:  .  [START ON 01/15/2019] HYDROcodone-acetaminophen (NORCO) 7.5-325 MG tablet, Take 1 tablet by mouth every 8 (eight) hours as needed., Disp: 90 tablet, Rfl: 0 .  losartan-hydrochlorothiazide (HYZAAR) 100-25 MG tablet, Take 1 tablet by mouth daily., Disp: , Rfl:  .  Multiple Vitamins-Minerals (MULTIVITAMIN WITH MINERALS) tablet, Take 1 tablet by mouth at bedtime. , Disp: , Rfl:  .  nortriptyline (PAMELOR) 50 MG capsule, Take 50 mg by mouth at bedtime. , Disp: , Rfl:  .  omeprazole (PRILOSEC OTC) 20 MG tablet, Take 20 mg by mouth daily as needed. , Disp: , Rfl:  .  sertraline (ZOLOFT) 100 MG tablet, Take one and 1/2 tablets (150 mg) by mouth daily, Disp: , Rfl:  .  SUMAtriptan (IMITREX) 100 MG tablet, Take 100 mg by mouth once. as needed for Migraine (May repeat in 2 hours. Max 2/24hours.), Disp: , Rfl:  .  tiZANidine (ZANAFLEX) 4 MG tablet, , Disp: , Rfl:  .  vitamin B-12 (CYANOCOBALAMIN) 100 MCG tablet, Take 500 mcg by mouth at bedtime. , Disp: , Rfl:  .  [START ON 12/16/2018] HYDROcodone-acetaminophen (NORCO) 7.5-325 MG tablet, Take 1 tablet by mouth every 8 (eight) hours as needed for severe pain., Disp: 90 tablet, Rfl: 0 .  [START ON 11/16/2018] HYDROcodone-acetaminophen (NORCO) 7.5-325 MG tablet, Take 1 tablet by mouth every 8 (eight) hours as needed for severe pain., Disp: 90 tablet, Rfl: 0  ROS  Constitutional: Denies any fever or chills Gastrointestinal: No  reported hemesis, hematochezia, vomiting, or acute GI distress Musculoskeletal: Denies any acute onset joint swelling, redness, loss of ROM, or weakness Neurological: No reported episodes of acute onset apraxia, aphasia, dysarthria, agnosia, amnesia, paralysis, loss of coordination, or loss of consciousness  Allergies  Ms. Kettering is allergic to 2,4-d dimethylamine (amisol); diphenhydramine; morphine and related; nsaids; and tetanus toxoids.  [place]  Drug: Ms. Okubo  reports no history of drug use. Alcohol:  reports no history of alcohol use. Tobacco:  reports that she has never smoked. She has never used smokeless tobacco. Medical:  has a past medical history of Acid reflux (10/27/2007), Arthritis (07/27/2007), Avitaminosis D (10/12/2012), Cataract, Depression, Gonalgia (05/30/2011), H/O cataract extraction (01/20/2015), History of migraine (08/30/2015), Hypertension, Migraines, Osteoarthritis, and Status post bariatric surgery (08/30/2015). Surgical: Ms. Fukushima  has a past surgical history that includes Abdominal hysterectomy; Bariatric Surgery; Wrist surgery; Knee surgery; and Tonsillectomy. Family: family history includes Cancer in her mother; Heart disease in her father.  Constitutional Exam  General appearance: Well nourished, well developed, and well hydrated. In no apparent acute distress Vitals:   11/05/18 0812  BP: (!) 155/88  Pulse: 68  Resp: 14  Temp: 98.4 F (36.9 C)  TempSrc: Oral  SpO2: 99%  Weight: 245 lb (111.1 kg)  Height: _0  (1.676 m)  Psych/Mental status: Alert, oriented x 3 (person, place, & time)       Eyes: PERLA Respiratory: No evidence of acute respiratory distress  Gait & Posture Assessment  Ambulation: Unassisted Gait: Antalgic Posture: WNL   Lower Extremity Exam    Side: Right lower extremity  Side: Left lower extremity  Stability: No instability observed          Stability: No instability observed          Skin & Extremity Inspection: Skin color,  temperature, and hair growth are WNL. No peripheral edema or cyanosis. No masses, redness, swelling, asymmetry, or associated skin lesions. No contractures.  Skin & Extremity Inspection: Skin color, temperature, and hair growth are WNL. No peripheral edema or cyanosis. No masses, redness, swelling, asymmetry, or associated skin lesions. No contractures.  Functional ROM: Guarding                  Functional ROM: Unrestricted ROM  Muscle Tone/Strength: Guarding  Muscle Tone/Strength: Functionally intact. No obvious neuro-muscular anomalies detected.  Sensory (Neurological): Unimpaired        Sensory (Neurological): Unimpaired            Palpation: Complains of area being tender to palpation  Palpation: No palpable anomalies   Assessment  Primary Diagnosis & Pertinent Problem List: The primary encounter diagnosis was Primary osteoarthritis of both knees. Diagnoses of Arthralgia of both knees, Myofascial pain, Chronic pain syndrome, and Long term prescription opiate use were also pertinent to this visit.  Status Diagnosis  Worsening Worsening Persistent 1. Primary osteoarthritis of both knees   2. Arthralgia of both knees   3. Myofascial pain   4. Chronic pain syndrome   5. Long term prescription opiate use     Problems updated and reviewed during this visit: No problems updated. Plan of Care  Pharmacotherapy (Medications Ordered): Meds ordered this encounter  Medications  . HYDROcodone-acetaminophen (NORCO) 7.5-325 MG tablet    Sig: Take 1 tablet by mouth every 8 (eight) hours as needed.    Dispense:  90 tablet    Refill:  0    Do not add this medication to the electronic "Automatic Refill" notification system. Patient may have prescription filled one day early if pharmacy is closed on scheduled refill date.    Order Specific Question:   Supervising Provider    Answer:   Milinda Pointer [phone]  . HYDROcodone-acetaminophen (NORCO) 7.5-325 MG tablet    Sig: Take 1  tablet by mouth every 8 (eight) hours as needed for severe pain.    Dispense:  90 tablet    Refill:  0    Do not place this medication, or any other prescription from our practice, on "Automatic Refill". Patient may have prescription filled one day early if pharmacy is closed on scheduled refill date.    Order Specific Question:   Supervising Provider    Answer:   Milinda Pointer [phone]  . HYDROcodone-acetaminophen (NORCO) 7.5-325 MG tablet    Sig: Take 1 tablet by mouth every 8 (eight) hours as needed for severe pain.    Dispense:  90 tablet    Refill:  0    Do not place this medication, or any other prescription from our practice, on "Automatic Refill". Patient may have prescription filled one day early if pharmacy is closed on scheduled refill date.    Order Specific Question:   Supervising Provider    Answer:   Milinda Pointer [[id number]]   New Prescriptions   No medications on file   Medications administered today: Carloyn A. Sieler had no medications administered during this visit. Lab-work, procedure(s), and/or referral(s): Orders Placed This Encounter  Procedures  . ToxASSURE Select 13 (MW), Urine   Imaging and/or referral(s): None  Interventional therapies: Planned, scheduled, and/or pending: None at this time . Rx for knee braces   Considering: Bilateral intra-articular Hyalgan knee injections. Bilateral intra-articular knee injections with Hyalgan versus genicular nerve blocks followed by radiofrequency.    Palliative PRN treatment(s): Bilateral intra-articular knee injection with steroids versus Hyalgan    Provider-requested follow-up: Return in about 3 months (around 02/04/2019) for MedMgmt.  Future Appointments  Date Time Provider [place]  02/03/2019  8:00 AM Sophia Francois, NP [facility] None   Primary Care Physician: [facility] Location: [facility] Note by: Sophia Francois NP Date:  11/05/2018; Time: 10:15 AM  Pain Score Disclaimer: We use the NRS-11 scale. This is a  self-reported, subjective measurement of pain severity with only modest accuracy. It is used primarily to identify changes within a particular patient. It must be understood that outpatient pain scales are significantly less accurate that those used for research, where they can be applied under ideal controlled circumstances with minimal exposure to variables. In reality, the score is likely to be a combination of pain intensity and pain affect, where pain affect describes the degree of emotional arousal or changes in action readiness caused by the sensory experience of pain. Factors such as social and work situation, setting, emotional state, anxiety levels, expectation, and prior pain experience may influence pain perception and show large inter-individual differences that may also be affected by time variables.  Patient instructions provided during this appointment: Patient Instructions   ____________________________________________________________________________________________  Medication Rules  Purpose: To inform patients, and their family members, of our rules and regulations.  Applies to: All patients receiving prescriptions (written or electronic).  Pharmacy of record: Pharmacy where electronic prescriptions will be sent. If written prescriptions are taken to a different pharmacy, please inform the nursing staff. The pharmacy listed in the electronic medical record should be the one where you would like electronic prescriptions to be sent.  Electronic prescriptions: In compliance with the [place] (STOP) Act of 2017 (Session Lanny Cramp [id number]), effective November 04, 2018, all controlled substances must be electronically prescribed. Calling prescriptions to the pharmacy will cease to exist.  Prescription refills: Only during scheduled appointments. Applies to all  prescriptions.  NOTE: The following applies primarily to controlled substances (Opioid* Pain Medications).   Patient's responsibilities: 1. Pain Pills: Bring all pain pills to every appointment (except for procedure appointments). 2. Pill Bottles: Bring pills in original pharmacy bottle. Always bring the newest bottle. Bring bottle, even if empty. 3. Medication refills: You are responsible for knowing and keeping track of what medications you take and those you need refilled. The day before your appointment: write a list of all prescriptions that need to be refilled. The day of the appointment: give the list to the admitting nurse. Prescriptions will be written only during appointments. If you forget a medication: it will not be "Called in", "Faxed", or "electronically sent". You will need to get another appointment to get these prescribed. No early refills. Do not call asking to have your prescription filled early. 4. Prescription Accuracy: You are responsible for carefully inspecting your prescriptions before leaving our office. Have the discharge nurse carefully go over each prescription with you, before taking them home. Make sure that your name is accurately spelled, that your address is correct. Check the name and dose of your medication to make sure it is accurate. Check the number of pills, and the written instructions to make sure they are clear and accurate. Make sure that you are given enough medication to last until your next medication refill appointment. 5. Taking Medication: Take medication as prescribed. When it comes to controlled substances, taking less pills or less frequently than prescribed is permitted and encouraged. Never take more pills than instructed. Never take medication more frequently than prescribed.  6. Inform other Doctors: Always inform, all of your healthcare providers, of all the medications you take. 7. Pain Medication from other Providers: You are not allowed  to accept any additional pain medication from any other Doctor or Healthcare provider. There are two exceptions to this rule. (see below) In the event that you require additional pain medication, you are responsible for notifying us, as stated  below. 8. Medication Agreement: You are responsible for carefully reading and following our Medication Agreement. This must be signed before receiving any prescriptions from our practice. Safely store a copy of your signed Agreement. Violations to the Agreement will result in no further prescriptions. (Additional copies of our Medication Agreement are available upon request.) 9. Laws, Rules, & Regulations: All patients are expected to follow all Federal and [employer], [employer], Rules, [employer]. Ignorance of the Laws does not constitute a valid excuse. The use of any illegal substances is prohibited. 10. Adopted CDC guidelines & recommendations: Target dosing levels will be at or below 60 MME/day. Use of benzodiazepines** is not recommended.  Exceptions: There are only two exceptions to the rule of not receiving pain medications from other Healthcare Providers. 1. Exception #1 (Emergencies): In the event of an emergency (i.e.: accident requiring emergency care), you are allowed to receive additional pain medication. However, you are responsible for: As soon as you are able, call our office [phone], at any time of the day or night, and leave a message stating your name, the date and nature of the emergency, and the name and dose of the medication prescribed. In the event that your call is answered by a member of our staff, make sure to document and save the date, time, and the name of the person that took your information.  2. Exception #2 (Planned Surgery): In the event that you are scheduled by another doctor or dentist to have any type of surgery or procedure, you are allowed (for a period no longer than 30 days), to receive additional pain medication, for  the acute post-op pain. However, in this case, you are responsible for picking up a copy of our "Post-op Pain Management for Surgeons" handout, and giving it to your surgeon or dentist. This document is available at our office, and does not require an appointment to obtain it. Simply go to our office during business hours (Monday-Thursday from 8:00 AM to 4:00 PM) (Friday 8:00 AM to 12:00 Noon) or if you have a scheduled appointment with [place], prior to your surgery, and ask for it by name. In addition, you will need to provide [place] with your name, name of your surgeon, type of surgery, and date of procedure or surgery.  *Opioid medications include: morphine, codeine, oxycodone, oxymorphone, hydrocodone, hydromorphone, meperidine, tramadol, tapentadol, buprenorphine, fentanyl, methadone. **Benzodiazepine medications include: diazepam (Valium), alprazolam (Xanax), clonazepam (Klonopine), lorazepam (Ativan), clorazepate (Tranxene), chlordiazepoxide (Librium), estazolam (Prosom), oxazepam (Serax), temazepam (Restoril), triazolam (Halcion) (Last updated: 01/01/2018) ____________________________________________________________________________________________    BMI Assessment: Estimated body mass index is 39.54 kg/m as calculated from the following:   Height as of this encounter: _0  (1.676 m).   Weight as of this encounter: 245 lb (111.1 kg).  BMI interpretation table: BMI level Category Range association with higher incidence of chronic pain  <18 kg/m2 Underweight   18.5-24.9 kg/m2 Ideal body weight   25-29.9 kg/m2 Overweight Increased incidence by 20%  30-34.9 kg/m2 Obese (Class I) Increased incidence by 68%  35-39.9 kg/m2 Severe obesity (Class II) Increased incidence by 136%  >40 kg/m2 Extreme obesity (Class III) Increased incidence by 254%   Patient's current BMI Ideal Body weight  Body mass index is 39.54 kg/m. Ideal body weight: 59.3 kg (130 lb 11.7 oz) Adjusted ideal body weight: 80 kg (176  lb 7 oz)   BMI Readings from Last 4 Encounters:  11/05/18 39.54 kg/m  08/04/18 39.94 kg/m  05/04/18 38.74 kg/m  02/15/18 38.74 kg/m  Wt Readings from Last 4 Encounters:  11/05/18 245 lb (111.1 kg)  08/04/18 240 lb (108.9 kg)  05/04/18 240 lb (108.9 kg)  02/15/18 240 lb (108.9 kg)   You were given 3 prescriptions for Hydrocodone.

## 2018-11-05 NOTE — Patient Instructions (Addendum)
____________________________________________________________________________________________  Medication Rules  Purpose: To inform patients, and their family members, of our rules and regulations.  Applies to: All patients receiving prescriptions (written or electronic).  Pharmacy of record: Pharmacy where electronic prescriptions will be sent. If written prescriptions are taken to a different pharmacy, please inform the nursing staff. The pharmacy listed in the electronic medical record should be the one where you would like electronic prescriptions to be sent.  Electronic prescriptions: In compliance with the [place] Strengthen Opioid Misuse Prevention (STOP) Act of 2017 (Session Law 2017-74/H243), effective November 04, 2018, all controlled substances must be electronically prescribed. Calling prescriptions to the pharmacy will cease to exist.  Prescription refills: Only during scheduled appointments. Applies to all prescriptions.  NOTE: The following applies primarily to controlled substances (Opioid* Pain Medications).   Patient's responsibilities: 1. Pain Pills: Bring all pain pills to every appointment (except for procedure appointments). 2. Pill Bottles: Bring pills in original pharmacy bottle. Always bring the newest bottle. Bring bottle, even if empty. 3. Medication refills: You are responsible for knowing and keeping track of what medications you take and those you need refilled. The day before your appointment: write a list of all prescriptions that need to be refilled. The day of the appointment: give the list to the admitting nurse. Prescriptions will be written only during appointments. If you forget a medication: it will not be "Called in", "Faxed", or "electronically sent". You will need to get another appointment to get these prescribed. No early refills. Do not call asking to have your prescription filled early. 4. Prescription Accuracy: You are responsible for  carefully inspecting your prescriptions before leaving our office. Have the discharge nurse carefully go over each prescription with you, before taking them home. Make sure that your name is accurately spelled, that your address is correct. Check the name and dose of your medication to make sure it is accurate. Check the number of pills, and the written instructions to make sure they are clear and accurate. Make sure that you are given enough medication to last until your next medication refill appointment. 5. Taking Medication: Take medication as prescribed. When it comes to controlled substances, taking less pills or less frequently than prescribed is permitted and encouraged. Never take more pills than instructed. Never take medication more frequently than prescribed.  6. Inform other Doctors: Always inform, all of your healthcare providers, of all the medications you take. 7. Pain Medication from other Providers: You are not allowed to accept any additional pain medication from any other Doctor or Healthcare provider. There are two exceptions to this rule. (see below) In the event that you require additional pain medication, you are responsible for notifying us, as stated below. 8. Medication Agreement: You are responsible for carefully reading and following our Medication Agreement. This must be signed before receiving any prescriptions from our practice. Safely store a copy of your signed Agreement. Violations to the Agreement will result in no further prescriptions. (Additional copies of our Medication Agreement are available upon request.) 9. Laws, Rules, & Regulations: All patients are expected to follow all Federal and State Laws, Statutes, Rules, & Regulations. Ignorance of the Laws does not constitute a valid excuse. The use of any illegal substances is prohibited. 10. Adopted CDC guidelines & recommendations: Target dosing levels will be at or below 60 MME/day. Use of benzodiazepines** is not  recommended.  Exceptions: There are only two exceptions to the rule of not receiving pain medications from other Healthcare Providers. 1.   Exception #1 (Emergencies): In the event of an emergency (i.e.: accident requiring emergency care), you are allowed to receive additional pain medication. However, you are responsible for: As soon as you are able, call our office [phone], at any time of the day or night, and leave a message stating your name, the date and nature of the emergency, and the name and dose of the medication prescribed. In the event that your call is answered by a member of our staff, make sure to document and save the date, time, and the name of the person that took your information.  2. Exception #2 (Planned Surgery): In the event that you are scheduled by another doctor or dentist to have any type of surgery or procedure, you are allowed (for a period no longer than 30 days), to receive additional pain medication, for the acute post-op pain. However, in this case, you are responsible for picking up a copy of our "Post-op Pain Management for Surgeons" handout, and giving it to your surgeon or dentist. This document is available at our office, and does not require an appointment to obtain it. Simply go to our office during business hours (Monday-Thursday from 8:00 AM to 4:00 PM) (Friday 8:00 AM to 12:00 Noon) or if you have a scheduled appointment with [place], prior to your surgery, and ask for it by name. In addition, you will need to provide [place] with your name, name of your surgeon, type of surgery, and date of procedure or surgery.  *Opioid medications include: morphine, codeine, oxycodone, oxymorphone, hydrocodone, hydromorphone, meperidine, tramadol, tapentadol, buprenorphine, fentanyl, methadone. **Benzodiazepine medications include: diazepam (Valium), alprazolam (Xanax), clonazepam (Klonopine), lorazepam (Ativan), clorazepate (Tranxene), chlordiazepoxide (Librium), estazolam (Prosom),  oxazepam (Serax), temazepam (Restoril), triazolam (Halcion) (Last updated: 01/01/2018) ____________________________________________________________________________________________    BMI Assessment: Estimated body mass index is 39.54 kg/m as calculated from the following:   Height as of this encounter: 5\' 6"  (1.676 m).   Weight as of this encounter: 245 lb (111.1 kg).  BMI interpretation table: BMI level Category Range association with higher incidence of chronic pain  <18 kg/m2 Underweight   18.5-24.9 kg/m2 Ideal body weight   25-29.9 kg/m2 Overweight Increased incidence by 20%  30-34.9 kg/m2 Obese (Class I) Increased incidence by 68%  35-39.9 kg/m2 Severe obesity (Class II) Increased incidence by 136%  >40 kg/m2 Extreme obesity (Class III) Increased incidence by 254%   Patient's current BMI Ideal Body weight  Body mass index is 39.54 kg/m. Ideal body weight: 59.3 kg (130 lb 11.7 oz) Adjusted ideal body weight: 80 kg (176 lb 7 oz)   BMI Readings from Last 4 Encounters:  11/05/18 39.54 kg/m  08/04/18 39.94 kg/m  05/04/18 38.74 kg/m  02/15/18 38.74 kg/m   Wt Readings from Last 4 Encounters:  11/05/18 245 lb (111.1 kg)  08/04/18 240 lb (108.9 kg)  05/04/18 240 lb (108.9 kg)  02/15/18 240 lb (108.9 kg)   You were given 3 prescriptions for Hydrocodone.

## 2018-11-05 NOTE — Progress Notes (Signed)
Nursing Pain Medication Assessment:  Safety precautions to be maintained throughout the outpatient stay will include: orient to surroundings, keep bed in low position, maintain call bell within reach at all times, provide assistance with transfer out of bed and ambulation.  Medication Inspection Compliance: Pill count conducted under aseptic conditions, in front of the patient. Neither the pills nor the bottle was removed from the patient's sight at any time. Once count was completed pills were immediately returned to the patient in their original bottle.  Medication: Hydrocodone/APAP Pill/Patch Count: 32 of 90 pills remain Pill/Patch Appearance: Markings consistent with prescribed medication Bottle Appearance: Standard pharmacy container. Clearly labeled. Filled Date:12/14/ 2019 Last Medication intake:  Today

## 2018-11-12 LAB — TOXASSURE SELECT 13 (MW), URINE

## 2019-02-03 ENCOUNTER — Ambulatory Visit: Payer: BC Managed Care – PPO | Attending: Nurse Practitioner | Admitting: Nurse Practitioner

## 2019-02-03 ENCOUNTER — Other Ambulatory Visit: Payer: Self-pay

## 2019-02-03 DIAGNOSIS — G894 Chronic pain syndrome: Secondary | ICD-10-CM

## 2019-02-03 DIAGNOSIS — M7918 Myalgia, other site: Secondary | ICD-10-CM

## 2019-02-03 DIAGNOSIS — M25561 Pain in right knee: Secondary | ICD-10-CM

## 2019-02-03 DIAGNOSIS — M25562 Pain in left knee: Secondary | ICD-10-CM

## 2019-02-03 DIAGNOSIS — M170 Bilateral primary osteoarthritis of knee: Secondary | ICD-10-CM | POA: Diagnosis not present

## 2019-02-03 MED ORDER — HYDROCODONE-ACETAMINOPHEN 7.5-325 MG PO TABS: 1.0000 | ORAL_TABLET | Freq: Three times a day (TID) | ORAL | 0 refills | Status: DC | PRN

## 2019-02-03 NOTE — Progress Notes (Signed)
Pain Management Encounter Note - Virtual Visit via Telephone Telehealth (real-time audio visits between healthcare provider and patient).  Patient's Phone No. & Preferred Pharmacy:  [phone] (home); [phone] (mobile); (Preferred) [phone]  [facility], [place] - [address] [place] [address] Phone: [phone] Fax: [phone]   Pre-screening note:  Our staff contacted Ms. Blizzard and offered her an "in person", "face-to-face" appointment versus a telephone encounter. She indicated preferring the telephone encounter, at this time.  Reason for Virtual Visit: COVID-19*  Social distancing based on CDC and AMA recommendations.   I contacted Renaldo Harrison on 02/03/2019 at 3:37 PM by telephone and clearly identified myself as Thad Ranger, NP. I verified that I was speaking with the correct person using two identifiers (Name and date of birth: 25-Mar-1961).  Advanced Informed Consent I sought verbal advanced consent from Renaldo Harrison for telemedicine interactions and virtual visit. I informed Ms. Kneale of the security and privacy concerns, risks, and limitations associated with performing an evaluation and management service by telephone. I also informed Ms. Priego of the availability of "in person" appointments and I informed her of the possibility of a patient responsible charge related to this service. Ms. Hepler expressed understanding and agreed to proceed.   Historic Elements   [name] is a 58 y.o. year old, female patient evaluated today after her last encounter by our practice on 11/05/2018. Ms. Wiesman  has a past medical history of Acid reflux (10/27/2007), Arthritis (07/27/2007), Avitaminosis D (10/12/2012), Cataract, Depression, Gonalgia (05/30/2011), H/O cataract extraction (01/20/2015), History of migraine (08/30/2015), Hypertension, Migraines, Osteoarthritis, and Status post bariatric surgery (08/30/2015). She also  has a past surgical history  that includes Abdominal hysterectomy; Bariatric Surgery; Wrist surgery; Knee surgery; and Tonsillectomy. Ms. Metzger has a current medication list which includes the following prescription(s): amlodipine, atenolol, vitamin d, ciprodex, diazepam, ferrous sulfate, hydrocodone-acetaminophen, hydrocodone-acetaminophen, hydrocodone-acetaminophen, losartan-hydrochlorothiazide, losartan-hydrochlorothiazide, multivitamin with minerals, nortriptyline, omeprazole, sertraline, sumatriptan, tizanidine, and vitamin b-12. She  reports that she has never smoked. She has never used smokeless tobacco. She reports that she does not drink alcohol or use drugs. Ms. Clendenning is allergic to 2,4-d dimethylamine (amisol); diphenhydramine; morphine and related; nsaids; and tetanus toxoids.   HPI  I last saw her on 11/05/2018. She is being evaluated for her bilateral knee pain. She admits that her knee pain has for 2-3/10. She denies any swelling in her knees.  She states that her knee brace and it helps to stabilize her but do not help a lot with the pain. She feels like the her right knee is getting worse. She states that a sharp pain that comes and goes. She admits that the when she is up for long periods she does have some pain that radiates into the back of her calf . She denies any recent falls or injuries. She denies new or different concerns.  Pharmacotherapy Assessment  Analgesic:Hydrocodone/APAP 7.5/325 one every 8 hours (22.5 mg/day of hydrocodone) MME/day:22.5 mg/day.   Monitoring: Pharmacotherapy: No side-effects or adverse reactions reported. [place] PMP: PDMP reviewed during this encounter.       Compliance: No problems identified. Plan: Refer to "POC".  Review of recent tests  DG Finger Middle Left CLINICAL DATA:  Larey Seat out of bed today. Middle finger pain and swelling. Initial encounter.  EXAM: LEFT MIDDLE FINGER 2+V  COMPARISON:  Left index finger radiographs also obtained today  FINDINGS: There is no  evidence of fracture or dislocation. There  is no evidence of arthropathy or other focal bone abnormality. Soft tissues are unremarkable.  IMPRESSION: No evidence of middle finger fracture or dislocation.  Electronically Signed   By: Myles Rosenthal M.D.   On: 02/15/2018 09:14 DG Finger Index Left CLINICAL DATA:  Larey Seat out of bed today. Left index finger injury and pain. Initial encounter.  EXAM: LEFT INDEX FINGER 2+V  COMPARISON:  None.  FINDINGS: A nondisplaced fracture is seen involving the radial margin of the base of the proximal phalanx. No other fractures are identified. No evidence of dislocation. Mild osteoarthritis seen involving the DIP joint.  IMPRESSION: Nondisplaced fracture involving the radial margin of the base of the proximal phalanx.  Electronically Signed   By: Myles Rosenthal M.D.   On: 02/15/2018 09:14   Clinical Support on 11/05/2018  Component Date Value Ref Range Status  . Summary 11/05/2018 FINAL   Final   Comment: ==================================================================== TOXASSURE SELECT 13 (MW) ==================================================================== Test                             Result       Flag       Units Drug Present and Declared for Prescription Verification   Hydrocodone                    649          EXPECTED   ng/mg creat   Hydromorphone                  216          EXPECTED   ng/mg creat   Dihydrocodeine                 485          EXPECTED   ng/mg creat   Norhydrocodone                 1443         EXPECTED   ng/mg creat    Sources of hydrocodone include scheduled prescription    medications. Hydromorphone, dihydrocodeine and norhydrocodone are    expected metabolites of hydrocodone. Hydromorphone and    dihydrocodeine are also available as scheduled prescription    medications. Drug Absent but Declared for Prescription Verification   Diazepam                       Not Detected UNEXPECTED ng/mg  creat =======================================                          ============================= Test                      Result    Flag   Units      Ref Range   Creatinine              117              mg/dL      >=63 ==================================================================== Declared Medications:  The flagging and interpretation on this report are based on the  following declared medications.  Unexpected results may arise from  inaccuracies in the declared medications.  **Note: The testing scope of this panel includes these medications:  Diazepam  Hydrocodone (Norco)  **Note: The testing scope of this panel does not include following  reported medications:  Acetaminophen (Norco)  Amlodipine  Besylate  Atenolol  Cyanocobalamin  Iron (Ferrous Sulfate)  Losartan (Losartan Potassium)  Multivitamin  Nortriptyline  Omeprazole  Sertraline  Sumatriptan  Tizanidine  Vitamin D ==================================================================== For clinical consultation, please call [phone]. =======================                          =============================================    Assessment  The primary encounter diagnosis was Primary osteoarthritis of both knees. Diagnoses of Arthralgia of both knees, Myofascial pain, and Chronic pain syndrome were also pertinent to this visit.  Plan of Care  I discussed the assessment and treatment plan with the patient. The patient was provided an opportunity to ask questions and all were answered. The patient agreed with the plan and demonstrated an understanding of the instructions.  Patient advised to call back or seek an in-person evaluation if the symptoms or condition worsens.  I have changed Dois Davenport A. Platter's HYDROcodone-acetaminophen, HYDROcodone-acetaminophen, and HYDROcodone-acetaminophen. I am also having her maintain her amLODipine, atenolol, losartan-hydrochlorothiazide, Vitamin D, omeprazole,  SUMAtriptan, multivitamin with minerals, nortriptyline, sertraline, vitamin B-12, diazepam, tiZANidine, ferrous sulfate, Ciprodex, and losartan-hydrochlorothiazide. Pharmacotherapy (Medications Ordered): Meds ordered this encounter  Medications  . HYDROcodone-acetaminophen (NORCO) 7.5-325 MG tablet    Sig: Take 1 tablet by mouth every 8 (eight) hours as needed for up to 30 days.    Dispense:  90 tablet    Refill:  0    Do not add this medication to the electronic "Automatic Refill" notification system. Patient may have prescription filled one day early if pharmacy is closed on scheduled refill date.    Order Specific Question:   Supervising Provider    Answer:   Delano Metz [phone]  . HYDROcodone-acetaminophen (NORCO) 7.5-325 MG tablet    Sig: Take 1 tablet by mouth every 8 (eight) hours as needed for up to 30 days for severe pain.    Dispense:  90 tablet    Refill:  0    Do not place this medication, or any other prescription from our practice, on "Automatic Refill". Patient may have prescription filled one day early if pharmacy is closed on scheduled refill date.    Order Specific Question:   Supervising Provider    Answer:   Delano Metz [phone]  . HYDROcodone-acetaminophen (NORCO) 7.5-325 MG tablet    Sig: Take 1 tablet by mouth every 8 (eight) hours as needed for up to 30 days for severe pain.    Dispense:  90 tablet    Refill:  0    Do not place this medication, or any other prescription from our practice, on "Automatic Refill". Patient may have prescription filled one day early if pharmacy is closed on scheduled refill date.    Order Specific Question:   Supervising Provider    Answer:   Delano Metz [phone]   Orders:  No orders of the defined types were placed in this encounter.  Follow-up plan:   Return in about 3 months (around 05/05/2019) for MedMgmt.   Total duration of non-face-to-face encounter: 12 minutes.  Note by: Thad Ranger, NP Date:  02/03/2019; Time: 3:37 PM  Disclaimer:  * Given the special circumstances of the COVID-19 pandemic, the federal government has announced that the Office for Civil Rights (OCR) will exercise its enforcement discretion and will not impose penalties on physicians using telehealth in the event of noncompliance with regulatory requirements under the [employer] Portability and Accountability Act (HIPAA) in connection with the good faith provision of telehealth during  the VHQIO-96 national public health emergency. (AMA)

## 2019-03-26 ENCOUNTER — Encounter: Payer: Self-pay | Admitting: *Deleted

## 2019-03-26 ENCOUNTER — Telehealth: Payer: Self-pay | Admitting: *Deleted

## 2019-05-04 ENCOUNTER — Telehealth: Payer: Self-pay | Admitting: *Deleted

## 2019-05-04 ENCOUNTER — Encounter: Payer: Self-pay | Admitting: Pain Medicine

## 2019-05-04 ENCOUNTER — Telehealth: Payer: Self-pay

## 2019-05-04 NOTE — Telephone Encounter (Signed)
Spoke with patient and she would like to notify us that she was given Valium 5 mg #30 Take 1 tablet every 8 hours as needed.

## 2019-05-05 ENCOUNTER — Other Ambulatory Visit: Payer: Self-pay

## 2019-05-05 ENCOUNTER — Ambulatory Visit: Payer: BC Managed Care – PPO | Attending: Nurse Practitioner | Admitting: Pain Medicine

## 2019-05-05 DIAGNOSIS — G894 Chronic pain syndrome: Secondary | ICD-10-CM | POA: Diagnosis not present

## 2019-05-05 DIAGNOSIS — M899 Disorder of bone, unspecified: Secondary | ICD-10-CM | POA: Diagnosis not present

## 2019-05-05 DIAGNOSIS — Z79899 Other long term (current) drug therapy: Secondary | ICD-10-CM | POA: Diagnosis not present

## 2019-05-05 DIAGNOSIS — M7918 Myalgia, other site: Secondary | ICD-10-CM | POA: Diagnosis not present

## 2019-05-05 DIAGNOSIS — Z789 Other specified health status: Secondary | ICD-10-CM

## 2019-05-05 DIAGNOSIS — G8929 Other chronic pain: Secondary | ICD-10-CM

## 2019-05-05 MED ORDER — HYDROCODONE-ACETAMINOPHEN 7.5-325 MG PO TABS: 1.0000 | ORAL_TABLET | Freq: Three times a day (TID) | ORAL | 0 refills | Status: DC | PRN

## 2019-05-05 NOTE — Patient Instructions (Signed)
____________________________________________________________________________________________  Medication Rules  Purpose: To inform patients, and their family members, of our rules and regulations.  Applies to: All patients receiving prescriptions (written or electronic).  Pharmacy of record: Pharmacy where electronic prescriptions will be sent. If written prescriptions are taken to a different pharmacy, please inform the nursing staff. The pharmacy listed in the electronic medical record should be the one where you would like electronic prescriptions to be sent.  Electronic prescriptions: In compliance with the [place] Strengthen Opioid Misuse Prevention (STOP) Act of 2017 (Session Law 2017-74/H243), effective November 04, 2018, all controlled substances must be electronically prescribed. Calling prescriptions to the pharmacy will cease to exist.  Prescription refills: Only during scheduled appointments. Applies to all prescriptions.  NOTE: The following applies primarily to controlled substances (Opioid* Pain Medications).   Patient's responsibilities: 1. Pain Pills: Bring all pain pills to every appointment (except for procedure appointments). 2. Pill Bottles: Bring pills in original pharmacy bottle. Always bring the newest bottle. Bring bottle, even if empty. 3. Medication refills: You are responsible for knowing and keeping track of what medications you take and those you need refilled. The day before your appointment: write a list of all prescriptions that need to be refilled. The day of the appointment: give the list to the admitting nurse. Prescriptions will be written only during appointments. No prescriptions will be written on procedure days. If you forget a medication: it will not be "Called in", "Faxed", or "electronically sent". You will need to get another appointment to get these prescribed. No early refills. Do not call asking to have your prescription filled  early. 4. Prescription Accuracy: You are responsible for carefully inspecting your prescriptions before leaving our office. Have the discharge nurse carefully go over each prescription with you, before taking them home. Make sure that your name is accurately spelled, that your address is correct. Check the name and dose of your medication to make sure it is accurate. Check the number of pills, and the written instructions to make sure they are clear and accurate. Make sure that you are given enough medication to last until your next medication refill appointment. 5. Taking Medication: Take medication as prescribed. When it comes to controlled substances, taking less pills or less frequently than prescribed is permitted and encouraged. Never take more pills than instructed. Never take medication more frequently than prescribed.  6. Inform other Doctors: Always inform, all of your healthcare providers, of all the medications you take. 7. Pain Medication from other Providers: You are not allowed to accept any additional pain medication from any other Doctor or Healthcare provider. There are two exceptions to this rule. (see below) In the event that you require additional pain medication, you are responsible for notifying us, as stated below. 8. Medication Agreement: You are responsible for carefully reading and following our Medication Agreement. This must be signed before receiving any prescriptions from our practice. Safely store a copy of your signed Agreement. Violations to the Agreement will result in no further prescriptions. (Additional copies of our Medication Agreement are available upon request.) 9. Laws, Rules, & Regulations: All patients are expected to follow all Federal and State Laws, Statutes, Rules, & Regulations. Ignorance of the Laws does not constitute a valid excuse. The use of any illegal substances is prohibited. 10. Adopted CDC guidelines & recommendations: Target dosing levels will be  at or below 60 MME/day. Use of benzodiazepines** is not recommended.  Exceptions: There are only two exceptions to the rule of not   receiving pain medications from other Healthcare Providers. 1. Exception #1 (Emergencies): In the event of an emergency (i.e.: accident requiring emergency care), you are allowed to receive additional pain medication. However, you are responsible for: As soon as you are able, call our office [phone], at any time of the day or night, and leave a message stating your name, the date and nature of the emergency, and the name and dose of the medication prescribed. In the event that your call is answered by a member of our staff, make sure to document and save the date, time, and the name of the person that took your information.  2. Exception #2 (Planned Surgery): In the event that you are scheduled by another doctor or dentist to have any type of surgery or procedure, you are allowed (for a period no longer than 30 days), to receive additional pain medication, for the acute post-op pain. However, in this case, you are responsible for picking up a copy of our "Post-op Pain Management for Surgeons" handout, and giving it to your surgeon or dentist. This document is available at our office, and does not require an appointment to obtain it. Simply go to our office during business hours (Monday-Thursday from 8:00 AM to 4:00 PM) (Friday 8:00 AM to 12:00 Noon) or if you have a scheduled appointment with us, prior to your surgery, and ask for it by name. In addition, you will need to provide us with your name, name of your surgeon, type of surgery, and date of procedure or surgery.  *Opioid medications include: morphine, codeine, oxycodone, oxymorphone, hydrocodone, hydromorphone, meperidine, tramadol, tapentadol, buprenorphine, fentanyl, methadone. **Benzodiazepine medications include: diazepam (Valium), alprazolam (Xanax), clonazepam (Klonopine), lorazepam (Ativan), clorazepate  (Tranxene), chlordiazepoxide (Librium), estazolam (Prosom), oxazepam (Serax), temazepam (Restoril), triazolam (Halcion) (Last updated: 01/01/2018) ____________________________________________________________________________________________   ____________________________________________________________________________________________  Medication Recommendations and Reminders  Applies to: All patients receiving prescriptions (written and/or electronic).  Medication Rules & Regulations: These rules and regulations exist for your safety and that of others. They are not flexible and neither are we. Dismissing or ignoring them will be considered "non-compliance" with medication therapy, resulting in complete and irreversible termination of such therapy. (See document titled "Medication Rules" for more details.) In all conscience, because of safety reasons, we cannot continue providing a therapy where the patient does not follow instructions.  Pharmacy of record:   Definition: This is the pharmacy where your electronic prescriptions will be sent.   We do not endorse any particular pharmacy.  You are not restricted in your choice of pharmacy.  The pharmacy listed in the electronic medical record should be the one where you want electronic prescriptions to be sent.  If you choose to change pharmacy, simply notify our nursing staff of your choice of new pharmacy.  Recommendations:  Keep all of your pain medications in a safe place, under lock and key, even if you live alone.   After you fill your prescription, take 1 week's worth of pills and put them away in a safe place. You should keep a separate, properly labeled bottle for this purpose. The remainder should be kept in the original bottle. Use this as your primary supply, until it runs out. Once it's gone, then you know that you have 1 week's worth of medicine, and it is time to come in for a prescription refill. If you do this correctly, it  is unlikely that you will ever run out of medicine.  To make sure that the above recommendation works,   it is very important that you make sure your medication refill appointments are scheduled at least 1 week before you run out of medicine. To do this in an effective manner, make sure that you do not leave the office without scheduling your next medication management appointment. Always ask the nursing staff to show you in your prescription , when your medication will be running out. Then arrange for the receptionist to get you a return appointment, at least 7 days before you run out of medicine. Do not wait until you have 1 or 2 pills left, to come in. This is very poor planning and does not take into consideration that we may need to cancel appointments due to bad weather, sickness, or emergencies affecting our staff.  "Partial Fill": If for any reason your pharmacy does not have enough pills/tablets to completely fill or refill your prescription, do not allow for a "partial fill". You will need a separate prescription to fill the remaining amount, which we will not provide. If the reason for the partial fill is your insurance, you will need to talk to the pharmacist about payment alternatives for the remaining tablets, but again, do not accept a partial fill.  Prescription refills and/or changes in medication(s):   Prescription refills, and/or changes in dose or medication, will be conducted only during scheduled medication management appointments. (Applies to both, written and electronic prescriptions.)  No refills on procedure days. No medication will be changed or started on procedure days. No changes, adjustments, and/or refills will be conducted on a procedure day. Doing so will interfere with the diagnostic portion of the procedure.  No phone refills. No medications will be "called into the pharmacy".  No Fax refills.  No weekend refills.  No Holliday refills.  No after hours  refills.  Remember:  Business hours are:  Monday to Thursday 8:00 AM to 4:00 PM Provider's Schedule: Crystal King, NP - Appointments are:  Medication management: Monday to Thursday 8:00 AM to 4:00 PM Cori Justus, MD - Appointments are:  Medication management: Monday and Wednesday 8:00 AM to 4:00 PM Procedure day: Tuesday and Thursday 7:30 AM to 4:00 PM Bilal Lateef, MD - Appointments are:  Medication management: Tuesday and Thursday 8:00 AM to 4:00 PM Procedure day: Monday and Wednesday 7:30 AM to 4:00 PM (Last update: 01/01/2018) ____________________________________________________________________________________________   ____________________________________________________________________________________________  CANNABIDIOL (AKA: CBD Oil or Pills)  Applies to: All patients receiving prescriptions of controlled substances (written and/or electronic).  General Information: Cannabidiol (CBD) was discovered in 1940. It is one of some 113 identified cannabinoids in cannabis (Marijuana) plants, accounting for up to 40% of the plant's extract. As of 2018, preliminary clinical research on cannabidiol included studies of anxiety, cognition, movement disorders, and pain.  Cannabidiol is consummed in multiple ways, including inhalation of cannabis smoke or vapor, as an aerosol spray into the cheek, and by mouth. It may be supplied as CBD oil containing CBD as the active ingredient (no added tetrahydrocannabinol (THC) or terpenes), a full-plant CBD-dominant hemp extract oil, capsules, dried cannabis, or as a liquid solution. CBD is thought not have the same psychoactivity as THC, and may affect the actions of THC. Studies suggest that CBD may interact with different biological targets, including cannabinoid receptors and other neurotransmitter receptors. As of 2018 the mechanism of action for its biological effects has not been determined.  In the [place], cannabidiol has a limited  approval by the Food and Drug Administration (FDA) for treatment of only two types   of epilepsy disorders. The side effects of long-term use of the drug include somnolence, decreased appetite, diarrhea, fatigue, malaise, weakness, sleeping problems, and others.  CBD remains a Schedule I drug prohibited for any use.  Legality: Some manufacturers ship CBD products nationally, an illegal action which the FDA has not enforced in 2018, with CBD remaining the subject of an FDA investigational new drug evaluation, and is not considered legal as a dietary supplement or food ingredient as of December 2018. Federal illegality has made it difficult historically to conduct research on CBD. CBD is openly sold in head shops and health food stores in some states where such sales have not been explicitly legalized.  Warning: Because it is not FDA approved for general use or treatment of pain, it is not required to undergo the same manufacturing controls as prescription drugs.  This means that the available cannabidiol (CBD) may be contaminated with THC.  If this is the case, it will trigger a positive urine drug screen (UDS) test for cannabinoids (Marijuana).  Because a positive UDS for illicit substances is a violation of our medication agreement, your opioid analgesics (pain medicine) may be permanently discontinued. (Last update: 01/22/2018) ____________________________________________________________________________________________    

## 2019-05-05 NOTE — Progress Notes (Signed)
Pain Management Virtual Encounter Note - Virtual Visit via Telephone Telehealth (real-time audio visits between healthcare provider and patient).   Patient's Phone No. & Preferred Pharmacy:  [phone] (home); [phone] (mobile); (Preferred) [phone] No e-mail address on record  [facility] [place] [place] - [address] [place] [id number] Phone: [phone] Fax: [phone]    Pre-screening note:  Our staff contacted Sophia Jackson and offered her an "in person", "face-to-face" appointment versus a telephone encounter. She indicated preferring the telephone encounter, at this time.   Reason for Virtual Visit: COVID-19*  Social distancing based on CDC and AMA recommendations.   I contacted Sophia HarrisonSandra A Jackson on 05/05/2019 via telephone.      I clearly identified myself as Oswaldo DoneFrancisco A Kymani Laursen, MD. I verified that I was speaking with the correct person using two identifiers (Name: Sophia Jackson, and date of birth: 11/19/1960).  Advanced Informed Consent I sought verbal advanced consent from Sophia Jackson for virtual visit interactions. I informed Sophia Jackson of possible security and privacy concerns, risks, and limitations associated with providing "not-in-person" medical evaluation and management services. I also informed Sophia Jackson of the availability of "in-person" appointments. Finally, I informed her that there would be a charge for the virtual visit and that she could be  personally, fully or partially, financially responsible for it. Sophia Jackson expressed understanding and agreed to proceed.   Historic Elements   Sophia Jackson is a 58 y.o. year old, female patient evaluated today after her last encounter by our practice on 05/04/2019. Sophia Jackson  has a past medical history of Acid reflux (10/27/2007), Arthritis (07/27/2007), Avitaminosis D (10/12/2012), Cataract, Depression, Gonalgia (05/30/2011), H/O cataract extraction (01/20/2015), History of migraine (08/30/2015),  Hypertension, Migraines, Osteoarthritis, and Status post bariatric surgery (08/30/2015). She also  has a past surgical history that includes Abdominal hysterectomy; Bariatric Surgery; Wrist surgery; Knee surgery; and Tonsillectomy. Sophia Jackson has a current medication list which includes the following prescription(s): amlodipine, atenolol, vitamin d, diazepam, ferrous sulfate, hydrocodone-acetaminophen, losartan-hydrochlorothiazide, multivitamin with minerals, nortriptyline, omeprazole, sertraline, sumatriptan, tizanidine, vitamin b-12, ciprodex, and losartan-hydrochlorothiazide. She  reports that she has never smoked. She has never used smokeless tobacco. She reports that she does not drink alcohol or use drugs. Sophia Jackson is allergic to 2,4-d dimethylamine (amisol); diphenhydramine; ibuprofen; morphine and related; nsaids; tetanus toxoids; and diclofenac.   HPI  Today, she is being contacted for medication management.  Pharmacotherapy Assessment  Analgesic: Hydrocodone/APAP 7.5/325 one every 8 hours (22.5 mg/day of hydrocodone) MME/day:22.5 mg/day.   Monitoring: Pharmacotherapy: No side-effects or adverse reactions reported. [place] PMP: PDMP reviewed during this encounter. The patient was recently given a prescription for diazepam. Compliance: No problems identified. Effectiveness: Clinically acceptable. Plan: Refer to "POC".  Pertinent Labs   SAFETY SCREENING Profile No results found for: SARSCOV2NAA, COVIDSOURCE, STAPHAUREUS, MRSAPCR, HCVAB, HIV, PREGTESTUR Renal Function Lab Results  Component Value Date   BUN 15 05/23/2016   CREATININE 0.97 05/23/2016   GFRAA >60 05/23/2016   GFRNONAA >60 05/23/2016   Hepatic Function Lab Results  Component Value Date   AST 24 05/23/2016   ALT 18 05/23/2016   ALBUMIN 4.1 05/23/2016   UDS Summary  Date Value Ref Range Status  11/05/2018 FINAL  Final    Comment:    ==================================================================== TOXASSURE  SELECT 13 (MW) ==================================================================== Test  Result       Flag       Units Drug Present and Declared for Prescription Verification   Hydrocodone                    649          EXPECTED   ng/mg creat   Hydromorphone                  216          EXPECTED   ng/mg creat   Dihydrocodeine                 485          EXPECTED   ng/mg creat   Norhydrocodone                 1443         EXPECTED   ng/mg creat    Sources of hydrocodone include scheduled prescription    medications. Hydromorphone, dihydrocodeine and norhydrocodone are    expected metabolites of hydrocodone. Hydromorphone and    dihydrocodeine are also available as scheduled prescription    medications. Drug Absent but Declared for Prescription Verification   Diazepam                       Not Detected UNEXPECTED ng/mg creat ==================================================================== Test                      Result    Flag   Units      Ref Range   Creatinine              117              mg/dL      >=20 ==================================================================== Declared Medications:  The flagging and interpretation on this report are based on the  following declared medications.  Unexpected results may arise from  inaccuracies in the declared medications.  **Note: The testing scope of this panel includes these medications:  Diazepam  Hydrocodone (Norco)  **Note: The testing scope of this panel does not include following  reported medications:  Acetaminophen (Norco)  Amlodipine Besylate  Atenolol  Cyanocobalamin  Iron (Ferrous Sulfate)  Losartan (Losartan Potassium)  Multivitamin  Nortriptyline  Omeprazole  Sertraline  Sumatriptan  Tizanidine  Vitamin D ==================================================================== For clinical consultation, please call [phone]. ====================================================================    Note: Above Lab results reviewed.  Recent imaging  DG Finger Middle Left CLINICAL DATA:  [place] out of bed today. Middle finger pain and swelling. Initial encounter.  EXAM: LEFT MIDDLE FINGER 2+V  COMPARISON:  Left index finger radiographs also obtained today  FINDINGS: There is no evidence of fracture or dislocation. There is no evidence of arthropathy or other focal bone abnormality. Soft tissues are unremarkable.  IMPRESSION: No evidence of middle finger fracture or dislocation.  Electronically Signed   By: Earle Gell M.D.   On: 02/15/2018 09:14 DG Finger Index Left CLINICAL DATA:  [place] out of bed today. Left index finger injury and pain. Initial encounter.  EXAM: LEFT INDEX FINGER 2+V  COMPARISON:  None.  FINDINGS: A nondisplaced fracture is seen involving the radial margin of the base of the proximal phalanx. No other fractures are identified. No evidence of dislocation. Mild osteoarthritis seen involving the DIP joint.  IMPRESSION: Nondisplaced fracture involving the radial margin of the base of the proximal phalanx.  Electronically Signed  By: Myles RosenthalJohn  Stahl M.D.   On: 02/15/2018 09:14  Assessment  The primary encounter diagnosis was Chronic pain syndrome. Diagnoses of Chronic musculoskeletal pain, Pharmacologic therapy, Disorder of skeletal system, and Problems influencing health status were also pertinent to this visit.  Plan of Care  I have discontinued Sophia Jackson HYDROcodone-acetaminophen and HYDROcodone-acetaminophen. I have also changed her HYDROcodone-acetaminophen. Additionally, I am having her maintain her amLODipine, atenolol, losartan-hydrochlorothiazide, Vitamin D, omeprazole, SUMAtriptan, multivitamin with minerals, nortriptyline, sertraline, vitamin B-12, diazepam, tiZANidine, ferrous sulfate, Ciprodex, and losartan-hydrochlorothiazide.  Pharmacotherapy  (Medications Ordered): Meds ordered this encounter  Medications  . HYDROcodone-acetaminophen (NORCO) 7.5-325 MG tablet    Sig: Take 1 tablet by mouth every 8 (eight) hours as needed for severe pain. Must last 30 days    Dispense:  90 tablet    Refill:  0    Chronic Pain: STOP Act (Not applicable) Fill 1 day early if closed on refill date. Do not fill until: 05/15/2019. To last until: 06/14/2019. Avoid benzodiazepines within 8 hours of opioids   Orders:  Orders Placed This Encounter  Procedures  . Comp. Metabolic Panel (12)    With GFR. Indications: Chronic Pain Syndrome (G89.4) & Pharmacotherapy (X91.478(Z79.899)    Order Specific Question:   Has the patient fasted?    Answer:   No    Order Specific Question:   CC Results    Answer:   PCP-NURSE [[id number]]  . Magnesium    Indication: Pharmacologic therapy (G95.621(Z79.899)    Order Specific Question:   CC Results    Answer:   PCP-NURSE [[id number]][[id number]]  . Vitamin B12    Indication: Pharmacologic therapy (Q46.962(Z79.899).    Order Specific Question:   CC Results    Answer:   PCP-NURSE [[id number]]  . Sedimentation rate    Indication: Disorder of skeletal system (M89.9)    Order Specific Question:   CC Results    Answer:   PCP-NURSE [[id number]][[id number]]  . 25-Hydroxyvitamin D Lcms D2+D3    Indication: Disorder of skeletal system (M89.9).    Order Specific Question:   CC Results    Answer:   PCP-NURSE [[id number]]  . C-reactive protein    Indication: Problems influencing health status (Z78.9)    Order Specific Question:   CC Results    Answer:   PCP-NURSE [[id number]][[id number]]   Follow-up plan:   Return in about 1 month (around 06/05/2019) for (VV), E/M (MM), (s/p Testing).      Considering:   Bilateral intra-articular Hyalgan knee injections. Bilateral intra-articular knee injections with Hyalgan versus genicular nerve blocks followed by radiofrequency.    Palliative PRN treatment(s):   Bilateral intra-articular knee injection with steroids versus Hyalgan.     Recent Visits No visits  were found meeting these conditions.  Showing recent visits within past 90 days and meeting all other requirements   Today's Visits Date Type Provider Dept  05/05/19 Office Visit Delano MetzNaveira, Clarinda Obi, MD [facility]  Showing today's visits and meeting all other requirements   Future Appointments No visits were found meeting these conditions.  Showing future appointments within next 90 days and meeting all other requirements   I discussed the assessment and treatment plan with the patient. The patient was provided an opportunity to ask questions and all were answered. The patient agreed with the plan and demonstrated an understanding of the instructions.  Patient advised to call back or seek an in-person evaluation if the symptoms or condition worsens.  Total duration of non-face-to-face encounter: 15 minutes.  Note  by: Oswaldo DoneFrancisco A Antonios Ostrow, MD Date: 05/05/2019; Time: 9:43 AM  Note: This dictation was prepared with Dragon dictation. Any transcriptional errors that may result from this process are unintentional.  Disclaimer:  * Given the special circumstances of the COVID-19 pandemic, the federal government has announced that the Office for Civil Rights (OCR) will exercise its enforcement discretion and will not impose penalties on physicians using telehealth in the event of noncompliance with regulatory requirements under the [employer] Insurance Portability and Accountability Act (HIPAA) in connection with the good faith provision of telehealth during the COVID-19 national public health emergency. (AMA)

## 2019-05-06 ENCOUNTER — Ambulatory Visit (INDEPENDENT_AMBULATORY_CARE_PROVIDER_SITE_OTHER): Payer: BC Managed Care – PPO

## 2019-05-06 ENCOUNTER — Other Ambulatory Visit: Payer: Self-pay

## 2019-05-06 ENCOUNTER — Ambulatory Visit
Admission: EM | Admit: 2019-05-06 | Discharge: 2019-05-06 | Disposition: A | Discharge status: Home / Self Care | Payer: BC Managed Care – PPO | Attending: Family Medicine | Admitting: Family Medicine

## 2019-05-06 DIAGNOSIS — M1712 Unilateral primary osteoarthritis, left knee: Secondary | ICD-10-CM

## 2019-05-06 DIAGNOSIS — M79672 Pain in left foot: Secondary | ICD-10-CM

## 2019-05-06 DIAGNOSIS — W0110XA Fall on same level from slipping, tripping and stumbling with subsequent striking against unspecified object, initial encounter: Secondary | ICD-10-CM | POA: Diagnosis not present

## 2019-05-06 DIAGNOSIS — M25562 Pain in left knee: Secondary | ICD-10-CM | POA: Diagnosis not present

## 2019-05-06 NOTE — Discharge Instructions (Signed)
Rest, ice, elevate.  Use your home pain medication as directed.  Take care  Dr. Lacinda Axon

## 2019-05-06 NOTE — ED Provider Notes (Signed)
[facility]    CSN: [id number] Arrival date & time: 05/06/19  [id number]  History   Chief Complaint Chief Complaint  Patient presents with  . Fall   HPI  58 year old female presents for evaluation after suffering a fall.  [place]  She slipped on a wet rock and fell.  She states that her foot became lodged between 2 rocks.  She also injured her knee in the process.  Patient is requesting an x-ray of her foot and her knee.  She is concerned that it is broken.  She localizes the pain to the anterior knee.  She localizes her left foot pain to the distal, lateral aspect of the foot.  No apparent bruising.  She is able to walk with difficulty.  She has taken her home pain medication without resolution.   Pain is currently 5/10 in severity. No other reported symptoms.  No other complaints.  PMH, Surgical Hx, Family Hx, Social History reviewed and updated as below.  Past Medical History:  Diagnosis Date  . Acid reflux 10/27/2007  . Arthritis 07/27/2007  . Avitaminosis D 10/12/2012  . Cataract   . Depression   . Gonalgia 05/30/2011  . H/O cataract extraction 01/20/2015  . History of migraine 08/30/2015  . Hypertension   . Migraines   . Osteoarthritis   . Status post bariatric surgery 08/30/2015    Patient Active Problem List   Diagnosis Date Noted  . Chronic musculoskeletal pain 05/05/2019  . Pharmacologic therapy 05/05/2019  . Disorder of skeletal system 05/05/2019  . Problems influencing health status 05/05/2019  . Chronic knee pain 08/18/2017  . Other specified health status 08/18/2017  . Pain in unspecified joint 08/18/2017  . Osteoarthritis of knee (Bilateral) (L>R) 11/12/2016  . Chronic migraine 11/11/2016  . Chronic pain syndrome 11/07/2016  . Elevated C-reactive protein (CRP) 11/24/2015  . Encounter for chronic pain management 11/22/2015  . Opiate use 08/30/2015  . Long term prescription opiate use 08/30/2015  . Long  term current use of opiate analgesic 08/30/2015  . Encounter for therapeutic drug level monitoring 08/30/2015  . Knee joint pain 08/30/2015  . Status post bariatric surgery 08/30/2015  . Morbid obesity (HCC) 08/30/2015  . Osteoarthritis 08/30/2015  . Opioid use agreement exists 08/30/2015  . Myofascial pain 08/30/2015  . Muscle spasms of lower extremity 08/30/2015  . Status post cataract extraction and insertion of intraocular lens of left eye 02/10/2015  . Status post cataract extraction and insertion of intraocular lens of right eye 01/20/2015  . Cataract, nuclear 12/26/2014  . AMD (age related macular degeneration) 12/26/2014  . 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 Posterior subcapsular polar age-related cataract of both eyes 12/26/2014  . Age-related nuclear cataract of both eyes 12/26/2014  . Iron deficiency anemia 11/15/2013  . Osteoarthritis of both knees 11/15/2013  . Vitamin D deficiency 10/12/2012  . Hyperlipidemia 12/02/2011  . S/P gastric bypass 05/30/2011  . Common migraine 01/28/2011  . Migraine without aura 01/28/2011  . Impaired fasting glucose 05/22/2010  . Type 2 diabetes mellitus treated without insulin (HCC) 05/22/2010  . Essential hypertension 05/22/2010  . Allergic rhinitis 02/08/2009  . Esophageal reflux 10/27/2007  . Depression 07/26/2007  . Peptic ulcer 07/26/2007  . Major depressive disorder, single episode 07/26/2007    Past Surgical History:  Procedure Laterality Date  . ABDOMINAL HYSTERECTOMY    . BARIATRIC SURGERY    . KNEE SURGERY    . TONSILLECTOMY    .  WRIST SURGERY      OB History   No obstetric history on file.     Home Medications    Prior to Admission medications   Medication Sig Start Date End Date Taking? Authorizing Provider  amLODipine (NORVASC) 5 MG tablet Take 5 mg by mouth daily.   Yes [provider]  atenolol (TENORMIN) 25 MG tablet Take 25 mg by mouth daily.   Yes [provider]  Cholecalciferol (VITAMIN D)  2000 UNITS tablet Take 2,000 Units by mouth daily.   Yes [provider]  diazepam (VALIUM) 5 MG tablet Take 5 mg by mouth every 8 (eight) hours as needed for anxiety (and prior to dental work).    Yes [provider]  ferrous sulfate 325 (65 FE) MG EC tablet Take 325 mg by mouth daily with breakfast.   Yes [provider]  HYDROcodone-acetaminophen (NORCO) 7.5-325 MG tablet Take 1 tablet by mouth every 8 (eight) hours as needed for severe pain. Must last 30 days 05/15/19 06/14/19 Yes Delano MetzNaveira, Francisco, MD  losartan-hydrochlorothiazide (HYZAAR) 100-25 MG tablet Take 1 tablet by mouth daily.   Yes [provider]  losartan-hydrochlorothiazide Mauri Reading(HYZAAR) 50-12.5 MG tablet  10/07/18  Yes [provider]  Multiple Vitamins-Minerals (MULTIVITAMIN WITH MINERALS) tablet Take 1 tablet by mouth at bedtime.    Yes [provider]  nortriptyline (PAMELOR) 50 MG capsule Take 50 mg by mouth at bedtime.  11/11/16  Yes [provider]  omeprazole (PRILOSEC OTC) 20 MG tablet Take 20 mg by mouth daily as needed.    Yes [provider]  sertraline (ZOLOFT) 100 MG tablet Take one and 1/2 tablets (150 mg) by mouth daily 09/06/16  Yes [provider]  SUMAtriptan (IMITREX) 100 MG tablet Take 100 mg by mouth once. as needed for Migraine (May repeat in 2 hours. Max 2/24hours.) 04/04/16  Yes [provider]  tiZANidine (ZANAFLEX) 4 MG tablet  09/23/17  Yes [provider]  vitamin B-12 (CYANOCOBALAMIN) 100 MCG tablet Take 500 mcg by mouth at bedtime.    Yes [provider]  CIPRODEX OTIC suspension  09/25/18   [provider]    Family History Family History  Problem Relation Age of Onset  . Cancer Mother   . Heart disease Father     Social History Social History   Tobacco Use  . Smoking status: Never Smoker  . Smokeless tobacco: Never Used  Substance Use Topics  . Alcohol use: No    Alcohol/week: 0.0  standard drinks  . Drug use: No     Allergies   2,4-d dimethylamine (amisol); Diphenhydramine; Ibuprofen; Morphine and related; Nsaids; Tetanus toxoids; and Diclofenac   Review of Systems Review of Systems  Constitutional: Negative.   Musculoskeletal:       Left knee pain, left foot pain.   Physical Exam Triage Vital Signs ED Triage Vitals  Enc Vitals Group     BP 05/06/19 0818 (!) 147/104     Pulse Rate 05/06/19 0818 76     Resp 05/06/19 0818 18     Temp 05/06/19 0818 98.1 F (36.7 C)     Temp Source 05/06/19 0818 Oral     SpO2 05/06/19 0818 98 %     Weight 05/06/19 0821 240 lb (108.9 kg)     Height 05/06/19 0821 5\' 6"  (1.676 m)     Head Circumference --      Peak Flow --      Pain Score 05/06/19 [id number]  5     Pain Loc --      Pain Edu? --      Excl. in [place]? --    Updated Vital Signs BP (!) 147/104 (BP Location: Right Arm)   Pulse 76   Temp 98.1 F (36.7 C) (Oral)   Resp 18   Ht 5\' 6"  (1.676 m)   Wt 108.9 kg   SpO2 98%   BMI 38.74 kg/m   Visual Acuity Right Eye Distance:   Left Eye Distance:   Bilateral Distance:    Right Eye Near:   Left Eye Near:    Bilateral Near:     Physical Exam Vitals signs and nursing note reviewed.  Constitutional:      General: She is not in acute distress.    Appearance: Normal appearance. She is obese.  HENT:     Head: Normocephalic and atraumatic.  Eyes:     General:        Right eye: No discharge.        Left eye: No discharge.     Conjunctiva/sclera: Conjunctivae normal.  Pulmonary:     Effort: Pulmonary effort is normal. No respiratory distress.  Musculoskeletal:       Feet:     Comments: Left knee -joint line tenderness.  No apparent effusion.  Patient has tenderness directly over the patella.  Ligaments intact.  Feet:     Comments: Patient with discrete area of tenderness at the labeled location.  Normal ankle. Skin:    General: Skin is warm.     Findings: No bruising.  Neurological:     Mental Status: She  is alert.  Psychiatric:        Mood and Affect: Mood normal.        Behavior: Behavior normal.    UC Treatments / Results  Labs (all labs ordered are listed, but only abnormal results are displayed) Labs Reviewed - No data to display  EKG   Radiology Dg Knee Complete 4 Views Left  Result Date: 05/06/2019 CLINICAL DATA:  Fall with injury. EXAM: LEFT KNEE - COMPLETE 4+ VIEW COMPARISON:  12/01/2015 FINDINGS: No evidence of fracture, dislocation, or joint effusion. Tricompartmental osteoarthritis with moderate spurring and patellofemoral joint narrowing. IMPRESSION: 1. No acute finding. 2. Tricompartmental osteoarthritis. Electronically Signed   By: Monte Fantasia M.D.   On: 05/06/2019 08:56   Dg Foot Complete Left  Result Date: 05/06/2019 CLINICAL DATA:  Status post fall yesterday complaining of pain of the fifth toe. EXAM: LEFT FOOT - COMPLETE 3+ VIEW COMPARISON:  None. FINDINGS: There is no evidence of fracture or dislocation. There is plantar calcaneal spur. Calcifications at Achilles tendon insertion to the calcaneus is noted. Soft tissues are unremarkable. IMPRESSION: No acute abnormality identified. Electronically Signed   By: Abelardo Diesel M.D.   On: 05/06/2019 08:56    Procedures Procedures (including critical care time)  Medications Ordered in UC Medications - No data to display  Initial Impression / Assessment and Plan / UC Course  I have reviewed the triage vital signs and the nursing notes.  Pertinent labs & imaging results that were available during my care of the patient were reviewed by me and considered in my medical decision making (see chart for details).    58 year old female presents with left knee pain and left foot pain after suffering a fall.  X-ray revealed tricompartmental arthritis of the left knee.  No acute findings.  X-ray of the left foot was negative.  Advised rest,  ice, elevation.  Advised use of her home pain medication as directed.  Final Clinical  Impressions(s) / UC Diagnoses   Final diagnoses:  Acute pain of left knee  Primary osteoarthritis of left knee  Left foot pain     Discharge Instructions     Rest, ice, elevate.  Use your home pain medication as directed.  Take care  Dr. Adriana Simasook     ED Prescriptions    None     Controlled Substance Prescriptions [place] Controlled Substance Registry consulted? Not Applicable   Tommie SamsCook, Yerania Chamorro G, [place] 05/06/19 [id number]

## 2019-05-06 NOTE — ED Triage Notes (Signed)
[place] and [place] her footing and fell on her left side. Would like an xray on her left knee and left foot to confirm it isn't broken. States when she fell her left foot got wedged between to rocks. Did take hydrocodone and it took the edge off but still wants to confirm if it is broken

## 2019-05-17 NOTE — Progress Notes (Signed)
- Normal Creatinine levels are between 0.5 and 0.9 mg/dl for our lab. Any condition that impairs the function of the kidneys is likely to raise the creatinine level in the blood. The most common causes of longstanding (chronic) kidney disease in adults are high blood pressure and diabetes. Other causes of elevated blood creatinine levels include drugs, ingestion of a large amount of dietary meat, kidney infections, rhabdomyolysis (abnormal muscle breakdown), and urinary tract obstruction. ___________________________________________________________________________________ eGFR (Estimated Glomerular Filtration Rate) results are reported as milliliters/minute/1.77m (mL/min/1.732m. Because some laboratories do not collect information on a patient's race when the sample is collected for testing, they may report calculated results for both African Americans and non-African Americans.  The [facility] only reporting actual results once values are < 60 mL/min. 1. Normal values: 90-120 mL/min 2. Below 60 mL/min suggests that some kidney damage has occurred. 3. Between 5923nd 30 indicate (Moderate) Stage 3 kidney disease. 4. Between 29 and 15 represent (Severe) Stage 4 kidney disease. 5. Less than 15 is considered (Kidney Failure) Stage 5. ___________________________________________________________________________________ - Normal ALP (Alkaline phosphatase) levels are between 35 -105 IU/L, for our Lab. High ALP could suggest liver damage or increased bone cell activity. If other tests such as bilirubin, aspartate aminotransferase (AST), or alanine aminotransferase (ALT) are also high, usually the increased ALP is coming from the liver. Higher-than-normal ALP levels can be seen with: biliary obstruction; bone conditions; osteoblastic bone tumors; osteomalacia; a healing fracture; liver disease; hepatitis; eating a fatty meal if you have blood type O or B; hyperparathyroidism;  leukemia; lymphoma; Paget disease; rickets; and/or sarcoidosis. ___________________________________________________________________________________ Normal Vitamin B-12 level(s): are between 180 and 914 pg/mL.  Elevated Vit B-12 level(s): Levels above 914 pg/mL. Possible causes: Taking supplements of vitamin B-12 (cobalamin). Medical conditions that can increase levels of vitamin B12 include: liver disease, kidney failure and myeloproliferative disorders, which includes myelocytic leukemia and polycythemia vera. Recommendations: Stop vitamin B-12 supplements. Contact primary care physician for further evaluation and recommendations. ___________________________________________________________________________________ Test: ESR (Erythrocyte Sedimentation Rate) levels Finding(s): Elevated  Normal Level(s): below 30 mm/hr. Clinical significance: The sed rate is an acute phase reactant that indirectly measures the degree of inflammation present in the body. It can be acute, developing rapidly after trauma, injury or infection, for example, or can occur over an extended time (chronic) with conditions such as autoimmune diseases or cancer. The ESR is not diagnostic; it is a non-specific, screening test that may be elevated in a number of these different conditions. It provides general information about the presence or absence of an inflammatory condition. Signs and symptoms may include: None Possible causes:  - Most common: inflammation Patient Recommendation(s): Unless contraindicated, consider the use of anti-inflammatory diet and medications. (visit http://inflammationfactor.com/ ) ___________________________________________________________________________________ Test: CRP (C-reactive protein) levels Finding(s): Elevated  Normal Level(s): Less than <1.0 mg/dL. Clinical significance: C-reactive protein (CRP) is produced by the liver. The level of CRP rises when there is inflammation throughout the  body. CRP goes up in response to inflammation. High levels suggests the presence of chronic inflammation but do not identify its location or cause. Drops of previously elevated levels suggest that the inflammation or infection is subsiding and/or responding to treatment. Signs and symptoms may include: Signs or symptoms, if present, would depend on the underlying inflammatory condition that is the cause of the elevated CRP level. Possible causes:  - Most common: High levels have been observed in obese patients, individuals with bacterial infections, chronic inflammation, or flare-ups of inflammatory conditions. Patient  Recommendation(s): Unless contraindicated, consider the use of anti-inflammatory diet and medications. (visit http://inflammationfactor.com/ ) ___________________________________________________________________________________ - The combined elevation of the ESR & CRP, may be suggestive of an autoimmune disease. Patients with elevated levels of both should consider an evaluation by a Rheumatologist. ___________________________________________________________________________________

## 2019-05-19 LAB — SEDIMENTATION RATE: Sed Rate: 54 mm/hr — ABNORMAL HIGH (ref 0–40)

## 2019-05-19 LAB — COMP. METABOLIC PANEL (12)
AST: 25 IU/L (ref 0–40)
Albumin/Globulin Ratio: 1.9 (ref 1.2–2.2)
Albumin: 4.5 g/dL (ref 3.8–4.9)
Alkaline Phosphatase: 126 IU/L — ABNORMAL HIGH (ref 39–117)
BUN/Creatinine Ratio: 14 (ref 9–23)
BUN: 15 mg/dL (ref 6–24)
Bilirubin Total: <0.2 mg/dL (ref 0.0–1.2)
Calcium: 9.7 mg/dL (ref 8.7–10.2)
Chloride: 98 mmol/L (ref 96–106)
Creatinine, Ser: 1.06 mg/dL — ABNORMAL HIGH (ref 0.57–1.00)
GFR calc Af Amer: 68 mL/min/{1.73_m2} (ref >59)
GFR calc non Af Amer: 59 mL/min/{1.73_m2} — ABNORMAL LOW (ref >59)
Globulin, Total: 2.4 g/dL (ref 1.5–4.5)
Glucose: 139 mg/dL — ABNORMAL HIGH (ref 65–99)
Potassium: 3.7 mmol/L (ref 3.5–5.2)
Sodium: 139 mmol/L (ref 134–144)
Total Protein: 6.9 g/dL (ref 6.0–8.5)

## 2019-05-19 LAB — MAGNESIUM: Magnesium: 2.2 mg/dL (ref 1.6–2.3)

## 2019-05-19 LAB — C-REACTIVE PROTEIN: CRP: 21 mg/L — ABNORMAL HIGH (ref 0–10)

## 2019-05-19 LAB — 25-HYDROXYVITAMIN D LCMS D2+D3: 25-Hydroxy, Vitamin D: 30 ng/mL

## 2019-05-19 LAB — 25-HYDROXY VITAMIN D LCMS D2+D3
25-Hydroxy, Vitamin D-2: <1 ng/mL
25-Hydroxy, Vitamin D-3: 30 ng/mL

## 2019-05-19 LAB — VITAMIN B12: Vitamin B-12: 1476 pg/mL — ABNORMAL HIGH (ref 232–1245)

## 2019-05-21 ENCOUNTER — Ambulatory Visit
Admission: RE | Admit: 2019-05-21 | Discharge: 2019-05-21 | Disposition: A | Discharge status: Home / Self Care | Payer: BC Managed Care – PPO | Attending: Family Medicine | Admitting: Family Medicine

## 2019-05-21 ENCOUNTER — Other Ambulatory Visit: Payer: Self-pay | Admitting: Family Medicine

## 2019-05-21 ENCOUNTER — Other Ambulatory Visit: Payer: Self-pay

## 2019-05-21 ENCOUNTER — Ambulatory Visit
Admission: RE | Admit: 2019-05-21 | Discharge: 2019-05-21 | Disposition: A | Discharge status: Home / Self Care | Payer: BC Managed Care – PPO | Source: Ambulatory Visit | Attending: Family Medicine | Admitting: Family Medicine

## 2019-05-21 DIAGNOSIS — M79672 Pain in left foot: Secondary | ICD-10-CM

## 2019-05-27 ENCOUNTER — Other Ambulatory Visit: Payer: Self-pay | Admitting: Pain Medicine

## 2019-05-27 DIAGNOSIS — G894 Chronic pain syndrome: Secondary | ICD-10-CM

## 2019-05-27 NOTE — Telephone Encounter (Signed)
No action needed

## 2019-06-02 ENCOUNTER — Telehealth: Payer: Self-pay | Admitting: *Deleted

## 2019-06-02 NOTE — Telephone Encounter (Signed)
Patient called to say she had left a message through my chart last week and she had not received a call back. On investigation she had put in a prescription refill request. She states on her last visit she was told she needed to get labs prior to giving her another prescription of norco. She was given 1 and told to get labs and the others would be sent in. Her labs are completed. She would like to request the norco to be sent in as discussed.

## 2019-06-03 NOTE — Telephone Encounter (Signed)
Orders from last visit state for patient to return for VV, MR before 06-14-19. Patient informed that no scripts are written outside of appointment. Will schedule appointment as ordered.

## 2019-06-07 ENCOUNTER — Other Ambulatory Visit: Payer: Self-pay | Admitting: Pain Medicine

## 2019-06-07 ENCOUNTER — Ambulatory Visit: Payer: BC Managed Care – PPO | Admitting: Pain Medicine

## 2019-06-07 ENCOUNTER — Encounter: Payer: Self-pay | Admitting: Pain Medicine

## 2019-06-07 DIAGNOSIS — G894 Chronic pain syndrome: Secondary | ICD-10-CM

## 2019-06-07 MED ORDER — HYDROCODONE-ACETAMINOPHEN 7.5-325 MG PO TABS: 1.0000 | ORAL_TABLET | Freq: Three times a day (TID) | ORAL | 0 refills | Status: DC | PRN

## 2019-06-08 ENCOUNTER — Ambulatory Visit: Payer: BC Managed Care – PPO | Admitting: Pain Medicine

## 2019-07-28 ENCOUNTER — Encounter: Payer: BC Managed Care – PPO | Admitting: Pain Medicine

## 2019-08-04 ENCOUNTER — Encounter: Payer: BC Managed Care – PPO | Admitting: Pain Medicine

## 2019-08-08 DIAGNOSIS — R748 Abnormal levels of other serum enzymes: Secondary | ICD-10-CM | POA: Insufficient documentation

## 2019-08-08 DIAGNOSIS — R7989 Other specified abnormal findings of blood chemistry: Secondary | ICD-10-CM | POA: Insufficient documentation

## 2019-08-08 DIAGNOSIS — R700 Elevated erythrocyte sedimentation rate: Secondary | ICD-10-CM | POA: Insufficient documentation

## 2019-08-08 NOTE — Progress Notes (Signed)
Pain Management Virtual Encounter Note - Virtual Visit via Telephone Telehealth (real-time audio visits between healthcare provider and patient).   Patient's Phone No. & Preferred Pharmacy:  [phone] (home); [phone] (mobile); (Preferred) [phone] [email or web address]  [facility] [place] [place] - [address] [place] [id number] Phone: [phone] Fax: [phone]    Pre-screening note:  Our staff contacted Ms. Fredric MareBailey and offered her an "in person", "face-to-face" appointment versus a telephone encounter. She indicated preferring the telephone encounter, at this time.   Reason for Virtual Visit: COVID-19*  Social distancing based on CDC and AMA recommendations.   I contacted Renaldo HarrisonSandra A Dinius on 08/09/2019 via telephone.      I clearly identified myself as Oswaldo DoneFrancisco A Eunice Oldaker, MD. I verified that I was speaking with the correct person using two identifiers (Name: Renaldo HarrisonSandra A Suleiman, and date of birth: 05/05/1961).  Advanced Informed Consent I sought verbal advanced consent from Renaldo HarrisonSandra A Finkler for virtual visit interactions. I informed Ms. Fredric MareBailey of possible security and privacy concerns, risks, and limitations associated with providing "not-in-person" medical evaluation and management services. I also informed Ms. Fredric MareBailey of the availability of "in-person" appointments. Finally, I informed her that there would be a charge for the virtual visit and that she could be  personally, fully or partially, financially responsible for it. Ms. Fredric MareBailey expressed understanding and agreed to proceed.   Historic Elements   Ms. Renaldo HarrisonSandra A Fujimoto is a 58 y.o. year old, female patient evaluated today after her last encounter by our practice on 06/02/2019. Ms. Fredric MareBailey  has a past medical history of Acid reflux (10/27/2007), Arthritis (07/27/2007), Avitaminosis D (10/12/2012), Cataract, Depression, Gonalgia (05/30/2011), H/O cataract extraction (01/20/2015), History of migraine (08/30/2015),  Hypertension, Migraines, Osteoarthritis, and Status post bariatric surgery (08/30/2015). She also  has a past surgical history that includes Abdominal hysterectomy; Bariatric Surgery; Wrist surgery; Knee surgery; and Tonsillectomy. Ms. Fredric MareBailey has a current medication list which includes the following prescription(s): amlodipine, atenolol, vitamin d, diazepam, ferrous sulfate, hydrocodone-acetaminophen, hydrocodone-acetaminophen, hydrocodone-acetaminophen, losartan-hydrochlorothiazide, multivitamin with minerals, nortriptyline, omeprazole, sertraline, sumatriptan, tizanidine, UNABLE TO FIND, and vitamin b-12. She  reports that she has never smoked. She has never used smokeless tobacco. She reports that she does not drink alcohol or use drugs. Ms. Fredric MareBailey is allergic to 2,4-d dimethylamine (amisol); diphenhydramine; ibuprofen; morphine and related; nsaids; tetanus toxoids; diclofenac; and buspirone.   HPI  Today, she is being contacted for medication management and follow-up evaluation s/p labs. Today we addressed primary vs secondary pains.  Today we went over her labs I explained them in detail.  I also explained to her that we will be ordering a rheumatoid factor and an ANA to further explore the reasons for her elevated sed rate and C-reactive protein.  We talked about her elevated B12 but it turns out that she is taking supplements after her past gastric bypass.  Pharmacotherapy Assessment  Analgesic: Hydrocodone/APAP 7.5/325 one every 8 hours (22.5 mg/day of hydrocodone) MME/day:22.5 mg/day.   Monitoring: Pharmacotherapy: No side-effects or adverse reactions reported. [place] PMP: PDMP reviewed during this encounter.       Compliance: No problems identified. Effectiveness: Clinically acceptable. Plan: Refer to "POC".  UDS:  Summary  Date Value Ref Range Status  11/05/2018 FINAL  Final    Comment:    ==================================================================== TOXASSURE SELECT 13  (MW) ==================================================================== Test  Result       Flag       Units Drug Present and Declared for Prescription Verification   Hydrocodone                    649          EXPECTED   ng/mg creat   Hydromorphone                  216          EXPECTED   ng/mg creat   Dihydrocodeine                 485          EXPECTED   ng/mg creat   Norhydrocodone                 1443         EXPECTED   ng/mg creat    Sources of hydrocodone include scheduled prescription    medications. Hydromorphone, dihydrocodeine and norhydrocodone are    expected metabolites of hydrocodone. Hydromorphone and    dihydrocodeine are also available as scheduled prescription    medications. Drug Absent but Declared for Prescription Verification   Diazepam                       Not Detected UNEXPECTED ng/mg creat ==================================================================== Test                      Result    Flag   Units      Ref Range   Creatinine              117              mg/dL      >=88 ==================================================================== Declared Medications:  The flagging and interpretation on this report are based on the  following declared medications.  Unexpected results may arise from  inaccuracies in the declared medications.  **Note: The testing scope of this panel includes these medications:  Diazepam  Hydrocodone (Norco)  **Note: The testing scope of this panel does not include following  reported medications:  Acetaminophen (Norco)  Amlodipine Besylate  Atenolol  Cyanocobalamin  Iron (Ferrous Sulfate)  Losartan (Losartan Potassium)  Multivitamin  Nortriptyline  Omeprazole  Sertraline  Sumatriptan  Tizanidine  Vitamin D ==================================================================== For clinical consultation, please call [phone]. ====================================================================    Laboratory Chemistry Profile (12 mo)  Renal: 05/13/2019: BUN 15; BUN/Creatinine Ratio 14; Creatinine, Ser 1.06  Lab Results  Component Value Date   GFRAA 68 05/13/2019   GFRNONAA 59 (L) 05/13/2019   Hepatic: 05/13/2019: Albumin 4.5 Lab Results  Component Value Date   AST 25 05/13/2019   ALT 18 05/23/2016   Other: 05/13/2019: 25-Hydroxy, Vitamin D 30; 25-Hydroxy, Vitamin D-2 <1.0; 25-Hydroxy, Vitamin D-3 30; CRP 21; Sed Rate 54; Vitamin B-12 1,476 Note: Above Lab results reviewed.  Imaging  Last 90 days:  Dg Foot 2 Views Left  Result Date: 05/21/2019 CLINICAL DATA:  Left foot pain and swelling after fall several weeks ago. EXAM: LEFT FOOT - 2 VIEW COMPARISON:  Radiographs of May 06, 2019. FINDINGS: There is no evidence of fracture or dislocation. Mild degenerative changes seen involving the first metatarsophalangeal joint. Mild posterior calcaneal spurring is noted. Soft tissues are unremarkable. IMPRESSION: Chronic findings as described above. No acute abnormality seen in the left foot. Electronically Signed   By: Lupita Raider  M.D.   On: 05/21/2019 11:46    Assessment  The primary encounter diagnosis was Chronic pain syndrome. Diagnoses of Osteoarthritis of knee (Bilateral) (L>R), Chronic knee pain (Primary Area of Pain) (Bilateral) (L>R), Elevated C-reactive protein (CRP), Elevated sed rate, and Elevated vitamin B12 level were also pertinent to this visit.  Plan of Care  I have discontinued Katharine Look A. Riechers's Ciprodex and Fluocinolone Acetonide. I am also having her start on HYDROcodone-acetaminophen and HYDROcodone-acetaminophen. Additionally, I am having her maintain her amLODipine, atenolol, losartan-hydrochlorothiazide, Vitamin D, omeprazole, SUMAtriptan, multivitamin with minerals, nortriptyline, sertraline, vitamin B-12, diazepam, tiZANidine, ferrous sulfate, UNABLE TO FIND, and  HYDROcodone-acetaminophen.  Pharmacotherapy (Medications Ordered): Meds ordered this encounter  Medications  . HYDROcodone-acetaminophen (NORCO) 7.5-325 MG tablet    Sig: Take 1 tablet by mouth every 8 (eight) hours as needed for severe pain. Must last 30 days    Dispense:  90 tablet    Refill:  0    Chronic Pain: STOP Act (Not applicable) Fill 1 day early if closed on refill date. Do not fill until: 08/13/2019. To last until: 09/12/2019. Avoid benzodiazepines within 8 hours of opioids  . HYDROcodone-acetaminophen (NORCO) 7.5-325 MG tablet    Sig: Take 1 tablet by mouth every 8 (eight) hours as needed for severe pain. Must last 30 days    Dispense:  90 tablet    Refill:  0    Chronic Pain: STOP Act (Not applicable) Fill 1 day early if closed on refill date. Do not fill until: 09/12/2019. To last until: 10/12/2019. Avoid benzodiazepines within 8 hours of opioids  . HYDROcodone-acetaminophen (NORCO) 7.5-325 MG tablet    Sig: Take 1 tablet by mouth every 8 (eight) hours as needed for severe pain. Must last 30 days    Dispense:  90 tablet    Refill:  0    Chronic Pain: STOP Act (Not applicable) Fill 1 day early if closed on refill date. Do not fill until: 10/12/2019. To last until: 11/11/2019. Avoid benzodiazepines within 8 hours of opioids   Orders:  Orders Placed This Encounter  Procedures  . ANA w/Reflex if Positive    Order Specific Question:   CC Results    Answer:   PCP-NURSE [[id number]]  . Rheumatoid factor    Order Specific Question:   CC Results    Answer:   PCP-NURSE [[id number]]   Follow-up plan:   Return in about 3 months (around 11/10/2019) for (VV), (MM) and to review the results of her lab work. (Rheumatoid factor and ANA).      Considering:   Diagnostic bilateral IA Hyalgan knee injections  Diagnostic bilateral IA steroid knee injections  Diagnostic bilateral genicular NB  Possible bilateral genicular RFA    Palliative PRN treatment(s):   None at this time     Recent  Visits No visits were found meeting these conditions.  Showing recent visits within past 90 days and meeting all other requirements   Today's Visits Date Type Provider Dept  08/09/19 Office Visit Milinda Pointer, MD [facility]  Showing today's visits and meeting all other requirements   Future Appointments No visits were found meeting these conditions.  Showing future appointments within next 90 days and meeting all other requirements   I discussed the assessment and treatment plan with the patient. The patient was provided an opportunity to ask questions and all were answered. The patient agreed with the plan and demonstrated an understanding of the instructions.  Patient advised to call back or seek an  in-person evaluation if the symptoms or condition worsens.  Total duration of non-face-to-face encounter: 15 minutes.  Note by: Oswaldo Done, MD Date: 08/09/2019; Time: 1:30 PM  Note: This dictation was prepared with Dragon dictation. Any transcriptional errors that may result from this process are unintentional.  Disclaimer:  * Given the special circumstances of the COVID-19 pandemic, the federal government has announced that the Office for Civil Rights (OCR) will exercise its enforcement discretion and will not impose penalties on physicians using telehealth in the event of noncompliance with regulatory requirements under the [employer] Portability and Accountability Act (HIPAA) in connection with the good faith provision of telehealth during the COVID-19 national public health emergency. (AMA)

## 2019-08-09 ENCOUNTER — Ambulatory Visit: Payer: BC Managed Care – PPO | Attending: Pain Medicine | Admitting: Pain Medicine

## 2019-08-09 ENCOUNTER — Other Ambulatory Visit: Payer: Self-pay

## 2019-08-09 DIAGNOSIS — R7982 Elevated C-reactive protein (CRP): Secondary | ICD-10-CM

## 2019-08-09 DIAGNOSIS — R700 Elevated erythrocyte sedimentation rate: Secondary | ICD-10-CM

## 2019-08-09 DIAGNOSIS — M170 Bilateral primary osteoarthritis of knee: Secondary | ICD-10-CM

## 2019-08-09 DIAGNOSIS — R748 Abnormal levels of other serum enzymes: Secondary | ICD-10-CM

## 2019-08-09 DIAGNOSIS — G894 Chronic pain syndrome: Secondary | ICD-10-CM

## 2019-08-09 DIAGNOSIS — M25561 Pain in right knee: Secondary | ICD-10-CM | POA: Diagnosis not present

## 2019-08-09 DIAGNOSIS — M25562 Pain in left knee: Secondary | ICD-10-CM

## 2019-08-09 DIAGNOSIS — G8929 Other chronic pain: Secondary | ICD-10-CM

## 2019-08-09 MED ORDER — HYDROCODONE-ACETAMINOPHEN 7.5-325 MG PO TABS: 1.0000 | ORAL_TABLET | Freq: Three times a day (TID) | ORAL | 0 refills | Status: DC | PRN

## 2019-08-25 LAB — RHEUMATOID FACTOR: Rhuematoid fact SerPl-aCnc: <10 IU/mL (ref 0.0–13.9)

## 2019-08-25 LAB — ANA W/REFLEX IF POSITIVE: Anti Nuclear Antibody (ANA): NEGATIVE

## 2019-10-20 ENCOUNTER — Telehealth: Payer: Self-pay | Admitting: *Deleted

## 2019-11-09 ENCOUNTER — Encounter: Payer: Self-pay | Admitting: Pain Medicine

## 2019-11-09 NOTE — Progress Notes (Signed)
Virtual Encounter - Pain Management PROVIDER NOTE: Information contained herein reflects review and annotations entered in association with encounter. Interpretation of such information and data should be left to medically-trained personnel. Information provided to patient can be located elsewhere in the medical record under "Patient Instructions". Document created using STT-dictation technology, any transcriptional errors that may result from process are unintentional.    Contact & Pharmacy Preferred: [phone] Home: [phone] (home) Mobile: [phone] (mobile) E-mail: [email or web address]  [place], [address] - [place] [address] Phone: [phone] Fax: [phone]   Pre-screening  Sophia Jackson offered "in-person" vs "virtual" encounter. She indicated preferring virtual for this encounter.   Reason COVID-19*  Social distancing based on CDC and AMA recommendations.   I contacted Sophia Jackson on 11/10/2019 via telephone.      I clearly identified myself as Gaspar Cola, MD. I verified that I was speaking with the correct person using two identifiers (Name: Sophia Jackson, and date of birth: 05/22/1961).  Consent I sought verbal advanced consent from Sophia Jackson for virtual visit interactions. I informed Sophia Jackson of possible security and privacy concerns, risks, and limitations associated with providing "not-in-person" medical evaluation and management services. I also informed Sophia Jackson of the availability of "in-person" appointments. Finally, I informed her that there would be a charge for the virtual visit and that she could be  personally, fully or partially, financially responsible for it. Sophia Jackson expressed understanding and agreed to proceed.   Historic Elements   Sophia Jackson is a 59 y.o. year old, female patient evaluated today after her last encounter by our practice on 10/20/2019. Sophia Jackson  has a past medical history  of Acid reflux (10/27/2007), Arthritis (07/27/2007), Avitaminosis D (10/12/2012), Cataract, Depression, Gonalgia (05/30/2011), H/O cataract extraction (01/20/2015), History of migraine (08/30/2015), Hypertension, Migraines, Osteoarthritis, and Status post bariatric surgery (08/30/2015). She also  has a past surgical history that includes Abdominal hysterectomy; Bariatric Surgery; Wrist surgery; Knee surgery; and Tonsillectomy. Sophia Jackson has a current medication list which includes the following prescription(s): amlodipine, atenolol, vitamin d, diazepam, ferrous sulfate, [START ON 11/11/2019] hydrocodone-acetaminophen, [START ON 12/11/2019] hydrocodone-acetaminophen, [START ON 01/10/2020] hydrocodone-acetaminophen, losartan-hydrochlorothiazide, multivitamin with minerals, nortriptyline, omeprazole, sertraline, sumatriptan, tizanidine, ubrogepant, vitamin b-12, and UNABLE TO FIND. She  reports that she has never smoked. She has never used smokeless tobacco. She reports that she does not drink alcohol or use drugs. Sophia Jackson is allergic to 2,4-d dimethylamine (amisol); diphenhydramine; ibuprofen; morphine and related; nsaids; tetanus toxoids; diclofenac; and buspirone.   HPI  Today, she is being contacted for medication management.  In addition today we reviewed her lab work.  Due to the fact that on prior studies her C-reactive protein and sed rate were elevated, I ordered labs to evaluate her rheumatoid factor, which came back within normal limits as well as the ANA.  Today I went over the results of the testing and I provided the patient with some information regarding the elevated sed rate and C-reactive protein.  Because the ANA and rheumatoid factor, both came back negative, there is a low likelihood that the problem may be rheumatological.  However it does not completely rule out lab.  I have encouraged the patient to follow-up with her primary care physician since the sed rate and C-reactive factor could be elevated  for other medical reasons.  She understood and accepted.  In addition to this, the patient indicated that she has been  having knee pain, but because of her type of insurance, she does not feel that she can afford any injections at this point.  I have reminded that her that we can do those for her in the event that she feels it gets to the point where it is affecting her range of motion or ability to ambulate.  She refers that she had some days off and this has allowed her to be off her feet and this has improved considerably her knee pain suggesting that it is secondary to osteoarthritis.  Because of this I have recommended that she keep working on bringing her weight down.  Pharmacotherapy Assessment  Analgesic: Hydrocodone/APAP 7.5/325 one every 8 hours (22.5 mg/day of hydrocodone) MME/day:22.5 mg/day.   Monitoring: Pharmacotherapy: No side-effects or adverse reactions reported. [place] PMP: PDMP reviewed during this encounter.       Compliance: No problems identified. Effectiveness: Clinically acceptable. Plan: Refer to "POC".  UDS:  Summary  Date Value Ref Range Status  11/05/2018 FINAL  Final    Comment:    ==================================================================== TOXASSURE SELECT 13 (MW) ==================================================================== Test                             Result       Flag       Units Drug Present and Declared for Prescription Verification   Hydrocodone                    649          EXPECTED   ng/mg creat   Hydromorphone                  216          EXPECTED   ng/mg creat   Dihydrocodeine                 485          EXPECTED   ng/mg creat   Norhydrocodone                 1443         EXPECTED   ng/mg creat    Sources of hydrocodone include scheduled prescription    medications. Hydromorphone, dihydrocodeine and norhydrocodone are    expected metabolites of hydrocodone. Hydromorphone and    dihydrocodeine are also available as scheduled  prescription    medications. Drug Absent but Declared for Prescription Verification   Diazepam                       Not Detected UNEXPECTED ng/mg creat ==================================================================== Test                      Result    Flag   Units      Ref Range   Creatinine              117              mg/dL      >=66 ==================================================================== Declared Medications:  The flagging and interpretation on this report are based on the  following declared medications.  Unexpected results may arise from  inaccuracies in the declared medications.  **Note: The testing scope of this panel includes these medications:  Diazepam  Hydrocodone (Norco)  **Note: The testing scope of this panel does not include following  reported medications:  Acetaminophen (Norco)  Amlodipine Besylate  Atenolol  Cyanocobalamin  Iron (Ferrous Sulfate)  Losartan (Losartan Potassium)  Multivitamin  Nortriptyline  Omeprazole  Sertraline  Sumatriptan  Tizanidine  Vitamin D ==================================================================== For clinical consultation, please call [phone]. ====================================================================    Laboratory Chemistry Profile (12 mo)  Renal: 05/13/2019: BUN 15; BUN/Creatinine Ratio 14; Creatinine, Ser 1.06  Lab Results  Component Value Date   GFRAA 68 05/13/2019   GFRNONAA 59 (L) 05/13/2019   Hepatic: 05/13/2019: Albumin 4.5 Lab Results  Component Value Date   AST 25 05/13/2019   ALT 18 05/23/2016   Other: 05/13/2019: 25-Hydroxy, Vitamin D 30; 25-Hydroxy, Vitamin D-2 <1.0; 25-Hydroxy, Vitamin D-3 30; CRP 21; Sed Rate 54; Vitamin B-12 1,476 Note: Above Lab results reviewed.  Imaging  DG Foot 2 Views Left CLINICAL DATA:  Left foot pain and swelling after fall several weeks ago.  EXAM: LEFT FOOT - 2 VIEW  COMPARISON:  Radiographs of May 06, 2019.  FINDINGS: There is  no evidence of fracture or dislocation. Mild degenerative changes seen involving the first metatarsophalangeal joint. Mild posterior calcaneal spurring is noted. Soft tissues are unremarkable.  IMPRESSION: Chronic findings as described above. No acute abnormality seen in the left foot.  Electronically Signed   By: Lupita Raider M.D.   On: 05/21/2019 11:46   Assessment  The primary encounter diagnosis was Chronic pain syndrome. A diagnosis of Chronic knee pain (Primary Area of Pain) (Bilateral) (L>R) was also pertinent to this visit.  Plan of Care  Problem-specific:  No problem-specific Assessment & Plan notes found for this encounter.  I am having Dois Davenport A. Bartok start on HYDROcodone-acetaminophen and HYDROcodone-acetaminophen. I am also having her maintain her amLODipine, atenolol, losartan-hydrochlorothiazide, Vitamin D, omeprazole, SUMAtriptan, multivitamin with minerals, nortriptyline, sertraline, vitamin B-12, diazepam, tiZANidine, ferrous sulfate, UNABLE TO FIND, Ubrogepant (UBRELVY PO), and HYDROcodone-acetaminophen.  Pharmacotherapy (Medications Ordered): Meds ordered this encounter  Medications  . HYDROcodone-acetaminophen (NORCO) 7.5-325 MG tablet    Sig: Take 1 tablet by mouth every 8 (eight) hours as needed for severe pain. Must last 30 days    Dispense:  90 tablet    Refill:  0    Chronic Pain: STOP Act (Not applicable) Fill 1 day early if closed on refill date. Do not fill until: 11/11/2019. To last until: 12/11/2019. Avoid benzodiazepines within 8 hours of opioids  . HYDROcodone-acetaminophen (NORCO) 7.5-325 MG tablet    Sig: Take 1 tablet by mouth every 8 (eight) hours as needed for severe pain. Must last 30 days    Dispense:  90 tablet    Refill:  0    Chronic Pain: STOP Act (Not applicable) Fill 1 day early if closed on refill date. Do not fill until: 12/11/2019. To last until: 01/10/2020. Avoid benzodiazepines within 8 hours of opioids  . HYDROcodone-acetaminophen  (NORCO) 7.5-325 MG tablet    Sig: Take 1 tablet by mouth every 8 (eight) hours as needed for severe pain. Must last 30 days    Dispense:  90 tablet    Refill:  0    Chronic Pain: STOP Act (Not applicable) Fill 1 day early if closed on refill date. Do not fill until: 01/10/2020. To last until: 02/09/2020. Avoid benzodiazepines within 8 hours of opioids   Orders:  No orders of the defined types were placed in this encounter.  Follow-up plan:   Return in about 13 weeks (around 02/09/2020) for (VV), (MM).      Considering:   Diagnostic bilateral IA Hyalgan knee injections  Diagnostic bilateral IA steroid  knee injections  Diagnostic bilateral genicular NB  Possible bilateral genicular RFA    Palliative PRN treatment(s):   None at this time      Recent Visits No visits were found meeting these conditions.  Showing recent visits within past 90 days and meeting all other requirements   Today's Visits Date Type Provider Dept  11/10/19 Telemedicine Delano Metz, MD [facility]  Showing today's visits and meeting all other requirements   Future Appointments No visits were found meeting these conditions.  Showing future appointments within next 90 days and meeting all other requirements   I discussed the assessment and treatment plan with the patient. The patient was provided an opportunity to ask questions and all were answered. The patient agreed with the plan and demonstrated an understanding of the instructions.  Patient advised to call back or seek an in-person evaluation if the symptoms or condition worsens.  Total duration of non-face-to-face encounter: 15 minutes.  Note by: Oswaldo Done, MD Date: 11/10/2019; Time: 10:09 AM

## 2019-11-10 ENCOUNTER — Other Ambulatory Visit: Payer: Self-pay

## 2019-11-10 ENCOUNTER — Ambulatory Visit: Payer: BC Managed Care – PPO | Attending: Pain Medicine | Admitting: Pain Medicine

## 2019-11-10 DIAGNOSIS — M25562 Pain in left knee: Secondary | ICD-10-CM

## 2019-11-10 DIAGNOSIS — G894 Chronic pain syndrome: Secondary | ICD-10-CM | POA: Diagnosis not present

## 2019-11-10 DIAGNOSIS — G8929 Other chronic pain: Secondary | ICD-10-CM

## 2019-11-10 DIAGNOSIS — M25561 Pain in right knee: Secondary | ICD-10-CM

## 2019-11-10 MED ORDER — HYDROCODONE-ACETAMINOPHEN 7.5-325 MG PO TABS: 1.0000 | ORAL_TABLET | Freq: Three times a day (TID) | ORAL | 0 refills | Status: DC | PRN

## 2019-11-10 NOTE — Patient Instructions (Signed)
______________________________________________________________________________________________  Weight Management Required  URGENT: Your weight has been found to be adversely affecting your health.  Dear [name]:  Your current Estimated body mass index is 38.74 kg/m as calculated from the following:   Height as of 05/06/19: 5\' 6"  (1.676 m).   Weight as of 05/06/19: 240 lb (108.9 kg).  Please use the table below to identify your weight category and associated incidence of chronic pain, secondary to your weight.  Body Mass Index (BMI) Classification BMI level (kg/m2) Category Associated incidence of chronic pain  <18  Underweight   18.5-24.9 Ideal body weight   25-29.9 Overweight  20%  30-34.9 Obese (Class I)  68%  35-39.9 Severe obesity (Class II)  136%  >40 Extreme obesity (Class III)  254%   In addition: You will be considered "Morbidly Obese", if your BMI is above 30 and you have one or more of the following conditions which are known to be directly associated with obesity: 1.    Type 2 Diabetes (Which in turn can lead to cardiovascular diseases (CVD), stroke, peripheral vascular diseases (PVD), retinopathy, nephropathy, and neuropathy) 2.    Cardiovascular Disease (High Blood Pressure; Congestive Heart Failure; High Cholesterol; Coronary Artery Disease; Angina; or History of Heart Attacks) 3.    Breathing problems (Asthma; obesity-hypoventilation syndrome; obstructive sleep apnea; chronic inflammatory airway disease; reactive airway disease; or shortness of breath) 4.    Chronic kidney disease 5.    Liver disease (nonalcoholic fatty liver disease) 6.    High blood pressure 7.    Acid reflux (gastroesophageal reflux disease; heartburn) 8.    Osteoarthritis (OA) (with any of the following: hip pain; knee pain; and/or low back pain) 9.    Low back pain (Lumbar Facet Syndrome; and/or Degenerative Disc Disease) 10.  Hip pain (Osteoarthritis of hip) (For every 1 lbs of added body  weight, there is a 2 lbs increase in pressure inside of each hip articulation. 1:2 mechanical relationship) 11.  Knee pain (Osteoarthritis of knee) (For every 1 lbs of added body weight, there is a 4 lbs increase in pressure inside of each knee articulation. 1:4 mechanical relationship) (patients with a BMI>30 kg/m2 were 6.8 times more likely to develop knee OA than normal-weight individuals) 12.  Cancer. Epidemiological studies have shown that obesity is a risk factor for: post-menopausal breast cancer; cancers of the endometrium, colon and kidney cancer; malignant adenomas of the oesophagus. Obese subjects have an approximately 1.5-3.5-fold increased risk of developing these cancers compared with normal-weight subjects, and it has been estimated that between 15 and 45% of these cancers can be attributed to overweight. More recent studies suggest that obesity may also increase the risk of other types of cancer, including pancreatic, hepatic and gallbladder cancer. (Ref: Obesity and cancer. Pischon T, Nthlings U, [employer] H. Proc Nutr Soc. 2008 May;67(2):128-45. doi: 10.1017/S0029665108006976.)  Recommendation: At this point it is urgent that you take a step back and concentrate in loosing weight. Dedicate 100% of your efforts on this task. Nothing else will improve your health more than bringing down your BMI to less than 30. Because most chronic pain patients do have difficulty exercising secondary to their pain, you must rely on proper nutrition and dieting in order to lose the weight. If your BMI is above 40, you should seriously consider bariatric surgery. A realistic goal is to lose 10% of your body weight over a period of 12 months.  If over time you have unsuccessfully try to lose weight, then it  is time for you to seek professional help and to enter a medically supervised weight management program.  Pain management considerations:  1.    Pharmacological Problems: Be advised that the use of opioid  analgesics (oxycodone; hydrocodone; morphine; methadone; codeine; and all of their derivatives) have been associated with decreased metabolism and weight gain.  For this reason, should we see that you are unable to lose weight while taking these medications, it may become necessary for [place] to taper down and indefinitely discontinue them.  2.    Technical Problems: The incidence of successful interventional therapies decreases as the patient's BMI increases. It is much more difficult to accomplish a safe and effective interventional therapy on a patient with a BMI above 35. 3.    Radiation Exposure Problems: The x-rays machine, used to accomplish injection therapies, will automatically increase their x-ray output in order to capture an appropriate bone image. This means that radiation exposure increases exponentially with the patient's BMI. (The higher the BMI, the higher the radiation exposure.) Although the level of radiation used at a given time is still safe to the patient, it is not for the physician and/or assisting staff. Unfortunately, radiation exposure is accumulative. Because physicians and the staff have to do procedures and be exposed on a daily basis, this can result in health problems such as cancer and radiation burns. Radiation exposure to the staff is monitored by the radiation batches that they wear. The exposure levels are reported back to the staff on a quarterly basis. Depending on levels of exposure, physicians and staff may be obligated by law to decrease this exposure. This means that they have the right and obligation to refuse providing therapies where they may be overexposed to radiation. For this reason, physicians may decline to offer therapies such as radiofrequency ablation or implants to patients with a BMI above 40. 4.    Current Trends: Be advised that the current trend is to no longer offer certain therapies to patients with a BMI equal to, or above 35, due to increase  perioperative risks, increased technical procedural difficulties, and excessive radiation exposure to healthcare personnel.  ______________________________________________________________________________________________

## 2019-12-27 ENCOUNTER — Other Ambulatory Visit: Payer: Self-pay

## 2019-12-27 ENCOUNTER — Ambulatory Visit (INDEPENDENT_AMBULATORY_CARE_PROVIDER_SITE_OTHER): Payer: BC Managed Care – PPO

## 2019-12-27 ENCOUNTER — Ambulatory Visit
Admission: EM | Admit: 2019-12-27 | Discharge: 2019-12-27 | Disposition: A | Discharge status: Home / Self Care | Payer: BC Managed Care – PPO | Attending: Family Medicine | Admitting: Family Medicine

## 2019-12-27 DIAGNOSIS — M1711 Unilateral primary osteoarthritis, right knee: Secondary | ICD-10-CM | POA: Diagnosis not present

## 2019-12-27 DIAGNOSIS — M25561 Pain in right knee: Secondary | ICD-10-CM | POA: Diagnosis not present

## 2019-12-27 MED ORDER — METHYLPREDNISOLONE 4 MG PO TBPK: ORAL_TABLET | ORAL | 0 refills | Status: DC

## 2019-12-27 NOTE — ED Triage Notes (Signed)
Patient complains of constant right knee pain since Friday with no known injury. Patient has no known injury, but has had trouble with knees before.

## 2019-12-27 NOTE — Discharge Instructions (Addendum)
Use a cane or crutches as necessary to protect her knee from further injury.  Use heat or ice as necessary for comfort.  Take the prednisone as prescribed.  Follow-up with your orthopedic surgeon as soon as possible

## 2019-12-27 NOTE — ED Provider Notes (Signed)
[facility]    CSN: [id number] Arrival date & time: 12/27/19  1342      History   Chief Complaint Chief Complaint  Patient presents with   Knee Pain    right    HPI Sophia Jackson is a 59 y.o. female.   HPI  59 year old female presents with a constant right knee pain that she has had since Friday.  She has had a terrible migraine headache on Thursday and does not have much recollection of anything that she may have done.  However she does not have any known injury.  Has history of trouble with her knees in the past stating that she needs to undergo total knee surgery.  The knee is hot and tender.  She has pain with most motion.  She also has been having difficulty with her ambulation and has consider using a cane recently.  She has no fever or chills.         Past Medical History:  Diagnosis Date   Acid reflux 10/27/2007   Arthritis 07/27/2007   Avitaminosis D 10/12/2012   Cataract    Depression    Gonalgia 05/30/2011   H/O cataract extraction 01/20/2015   History of migraine 08/30/2015   Hypertension    Migraines    Osteoarthritis    Status post bariatric surgery 08/30/2015    Patient Active Problem List   Diagnosis Date Noted   Elevated sed rate 08/08/2019   Elevated vitamin B12 level 08/08/2019   Chronic musculoskeletal pain 05/05/2019   Pharmacologic therapy 05/05/2019   Disorder of skeletal system 05/05/2019   Problems influencing health status 05/05/2019   Chronic knee pain (Primary Area of Pain) (Bilateral) (L>R) 08/18/2017   Other specified health status 08/18/2017   Pain in unspecified joint 08/18/2017   Osteoarthritis of knee (Bilateral) (L>R) 11/12/2016   Chronic migraine 11/11/2016   Chronic pain syndrome 11/07/2016   Elevated C-reactive protein (CRP) 11/24/2015   Encounter for chronic pain management 11/22/2015   Opiate use 08/30/2015   Long term prescription opiate use 08/30/2015   Long term current  use of opiate analgesic 08/30/2015   Encounter for therapeutic drug level monitoring 08/30/2015   Knee joint pain 08/30/2015   Status post bariatric surgery 08/30/2015   Morbid obesity (HCC) 08/30/2015   Osteoarthritis 08/30/2015   Opioid use agreement exists 08/30/2015   Myofascial pain 08/30/2015   Muscle spasms of lower extremity 08/30/2015   Status post cataract extraction and insertion of intraocular lens of left eye 02/10/2015   Status post cataract extraction and insertion of intraocular lens of right eye 01/20/2015   Cataract, nuclear 12/26/2014   AMD (age related macular degeneration) 12/26/2014   Cornea guttata 12/26/2014   Posterior subcapsular polar age-related cataract of both eyes 12/26/2014   Age-related nuclear cataract of both eyes 12/26/2014   Iron deficiency anemia 11/15/2013   Vitamin D deficiency 10/12/2012   Hyperlipidemia 12/02/2011   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      Home Medications    Prior to Admission medications   Medication Sig Start Date End Date Taking? Authorizing Provider  amLODipine (NORVASC) 5 MG tablet Take 5 mg by mouth daily.   Yes [provider]  atenolol (TENORMIN) 25 MG tablet Take 25 mg by mouth daily.   Yes [provider]  Cholecalciferol (VITAMIN D) 2000 UNITS tablet Take 2,000 Units by mouth daily.    Yes [provider]  diazepam (VALIUM) 5 MG tablet Take 5 mg by mouth every 8 (eight) hours as needed for anxiety (and prior to dental work).    Yes [provider]  ferrous sulfate 325 (65 FE) MG EC tablet Take 325 mg by mouth daily with breakfast.   Yes [provider]  HYDROcodone-acetaminophen (NORCO) 7.5-325 MG tablet Take 1 tablet by mouth every 8 (eight) hours as needed for severe pain. Must last 30 days 12/11/19 01/10/20 Yes Milinda Pointer, MD  HYDROcodone-acetaminophen (NORCO) 7.5-325 MG tablet Take 1 tablet by mouth every 8 (eight) hours as needed for severe pain. Must last 30 days 01/10/20 02/09/20 Yes Milinda Pointer, MD  losartan-hydrochlorothiazide (HYZAAR) 100-25 MG tablet Take 1 tablet by mouth daily.   Yes [provider]  Multiple Vitamins-Minerals (MULTIVITAMIN WITH MINERALS) tablet Take 1 tablet by mouth at bedtime.    Yes [provider]  nortriptyline (PAMELOR) 50 MG capsule Take 50 mg by mouth at bedtime.  11/11/16  Yes [provider]  omeprazole (PRILOSEC OTC) 20 MG tablet Take 20 mg by mouth daily as needed.    Yes [provider]  sertraline (ZOLOFT) 100 MG tablet Take one and 1/2 tablets (150 mg) by mouth daily 09/06/16  Yes [provider]  SUMAtriptan (IMITREX) 100 MG tablet Take 100 mg by mouth once. as needed for Migraine (May repeat in 2 hours. Max 2/24hours.) 04/04/16  Yes [provider]  tiZANidine (ZANAFLEX) 4 MG tablet  09/23/17  Yes [provider]  Ubrogepant (UBRELVY PO) Take 100 mg by mouth daily as needed.   Yes [provider]  UNABLE TO FIND 50 mg as needed. Alvie Heidelberg   Yes [provider]  vitamin B-12 (CYANOCOBALAMIN) 100 MCG tablet Take 500 mcg by mouth at bedtime.    Yes [provider]  HYDROcodone-acetaminophen (NORCO) 7.5-325 MG tablet Take 1 tablet by mouth every 8 (eight) hours as needed for severe pain. Must last 30 days 11/11/19 12/11/19   Milinda Pointer, MD  methylPREDNISolone (MEDROL DOSEPAK) 4 MG TBPK tablet Take per package instructions 12/27/19   Lorin Picket, PA-C    Family History Family History  Problem Relation Age of Onset   Cancer Mother    Heart disease Father     Social History Social History   Tobacco Use   Smoking status: Never Smoker   Smokeless tobacco: Never Used  Substance Use Topics   Alcohol use: No    Alcohol/week: 0.0 standard drinks   Drug use: No     Allergies   2,4-d dimethylamine (amisol); Diphenhydramine; Ibuprofen; Morphine and related; Nsaids; Tetanus toxoids; Diclofenac; and Buspirone   Review of Systems Review of Systems  Constitutional: Positive for activity change. Negative for appetite change, chills, diaphoresis, fatigue and fever.  Musculoskeletal: Positive for arthralgias, gait problem and joint swelling.  All other systems reviewed and are negative.    Physical Exam Triage Vital Signs ED Triage  Vitals  Enc Vitals Group     BP 12/27/19 1448 (!) 151/101     Pulse Rate 12/27/19 1448 78     Resp 12/27/19 1448 16     Temp 12/27/19 1448 98.4 F (36.9 C)     Temp Source 12/27/19 1448 Oral     SpO2 12/27/19 1448 100 %     Weight 12/27/19 1444 240 lb (108.9 kg)     Height 12/27/19 1444 5\' 6"  (1.676 m)     Head Circumference --      Peak Flow --      Pain Score 12/27/19 1444 6     Pain Loc --      Pain Edu? --      Excl. in GC? --    No data found.  Updated Vital Signs BP (!) 151/101 (BP Location: Left Arm)    Pulse 78    Temp 98.4 F (36.9 C) (Oral)    Resp 16    Ht 5\' 6"  (1.676 m)    Wt 240 lb (108.9 kg)    SpO2 100%    BMI 38.74 kg/m   Visual Acuity Right Eye Distance:   Left Eye Distance:   Bilateral Distance:    Right Eye Near:   Left Eye Near:    Bilateral Near:     Physical Exam Vitals and nursing note reviewed.  Constitutional:      General: She is not in acute distress.    Appearance: Normal appearance. She is not  ill-appearing or toxic-appearing.  HENT:     Head: Normocephalic and atraumatic.     Nose: Nose normal.  Eyes:     Conjunctiva/sclera: Conjunctivae normal.  Musculoskeletal:        General: Swelling, tenderness and deformity present.     Cervical back: Normal range of motion and neck supple.     Comments: Examination of the right knee shows mild swelling warmth and mild erythema.  Tenderness is over most surfaces but particularly suprapatellar.  She is able to flex to 90 degrees and extends to 0 degrees passively.  She has no calf tenderness.  There is no calf warmth.  Collateral ligaments are intact.  She has a negative anterior drawer sign.  He does have a small effusion present.  Skin:    General: Skin is warm and dry.  Neurological:     General: No focal deficit present.     Mental Status: She is alert and oriented to person, place, and time.  Psychiatric:        Mood and Affect: Mood normal.        Behavior: Behavior normal.        Thought Content: Thought content normal.        Judgment: Judgment normal.      UC Treatments / Results  Labs (all labs ordered are listed, but only abnormal results are displayed) Labs Reviewed - No data to display  EKG   Radiology DG Knee Complete 4 Views Right  Result Date: 12/27/2019 CLINICAL DATA:  Constant knee pain EXAM: RIGHT KNEE - COMPLETE 4+ VIEW COMPARISON:  12/01/2015 FINDINGS: Mild lateral deviation of the patella. Subarticular lucency at the lateral femoral condyle. Moderate arthritis of the lateral compartment. Mild arthritis of the medial compartment. Advanced arthritis of the patellofemoral compartment. Small knee effusion. No acute displaced fracture IMPRESSION: 1. No acute osseous abnormality. 2. Tricompartment arthritis with probable small knee effusion. 3. Subarticular lucency at the lateral femoral condyle, possibly a subarticular  geode or small osteochondral lesion. Electronically Signed   By: Jasmine Pang M.D.   On:  12/27/2019 16:00    Procedures Procedures (including critical care time)  Medications Ordered in UC Medications - No data to display  Initial Impression / Assessment and Plan / UC Course  I have reviewed the triage vital signs and the nursing notes.  Pertinent labs & imaging results that were available during my care of the patient were reviewed by me and considered in my medical decision making (see chart for details).   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  Has been able to ambulate.  He is not allowed to take NSAIDs.  Reviewed her x-rays which showed no acute osseous abnormality.  She has tricompartmental arthritis with probable small knee effusion.  There is a subarticular lucency the lateral femoral condyle probable a articular geode or small osteochondral lesion.  I told her she should follow-up with her orthopedic surgeon next week.  In the meantime I will place her on prednisone since she is unable to take NSAIDs.  I recommended that she use crutches or a cane to help protect the knee.  She may use ice or heat as necessary for comfort.  Final Clinical Impressions(s) / UC Diagnoses   Final diagnoses:  Tricompartment osteoarthritis of right knee     Discharge Instructions     Use a cane or crutches as necessary to protect her knee from further injury.  Use heat or ice as necessary for comfort.  Take the prednisone as prescribed.  Follow-up with your orthopedic surgeon as soon as possible    ED Prescriptions    Medication Sig Dispense Auth. Provider   methylPREDNISolone (MEDROL DOSEPAK) 4 MG TBPK tablet Take per package instructions 21 tablet Lutricia Feil, PA-C     PDMP not reviewed this encounter.   Lutricia Feil, PA-C 12/27/19 1636

## 2020-02-03 ENCOUNTER — Encounter: Payer: Self-pay | Admitting: Pain Medicine

## 2020-02-04 IMAGING — CR LEFT FOOT - 2 VIEW
2 series · 2 of 2 positions shown · non-contrast
Comparison: Radiographs May 06, 2019.

CLINICAL DATA: Left foot pain and swelling after fall several weeks
ago.

EXAM:
LEFT FOOT - 2 VIEW

[foot ap]
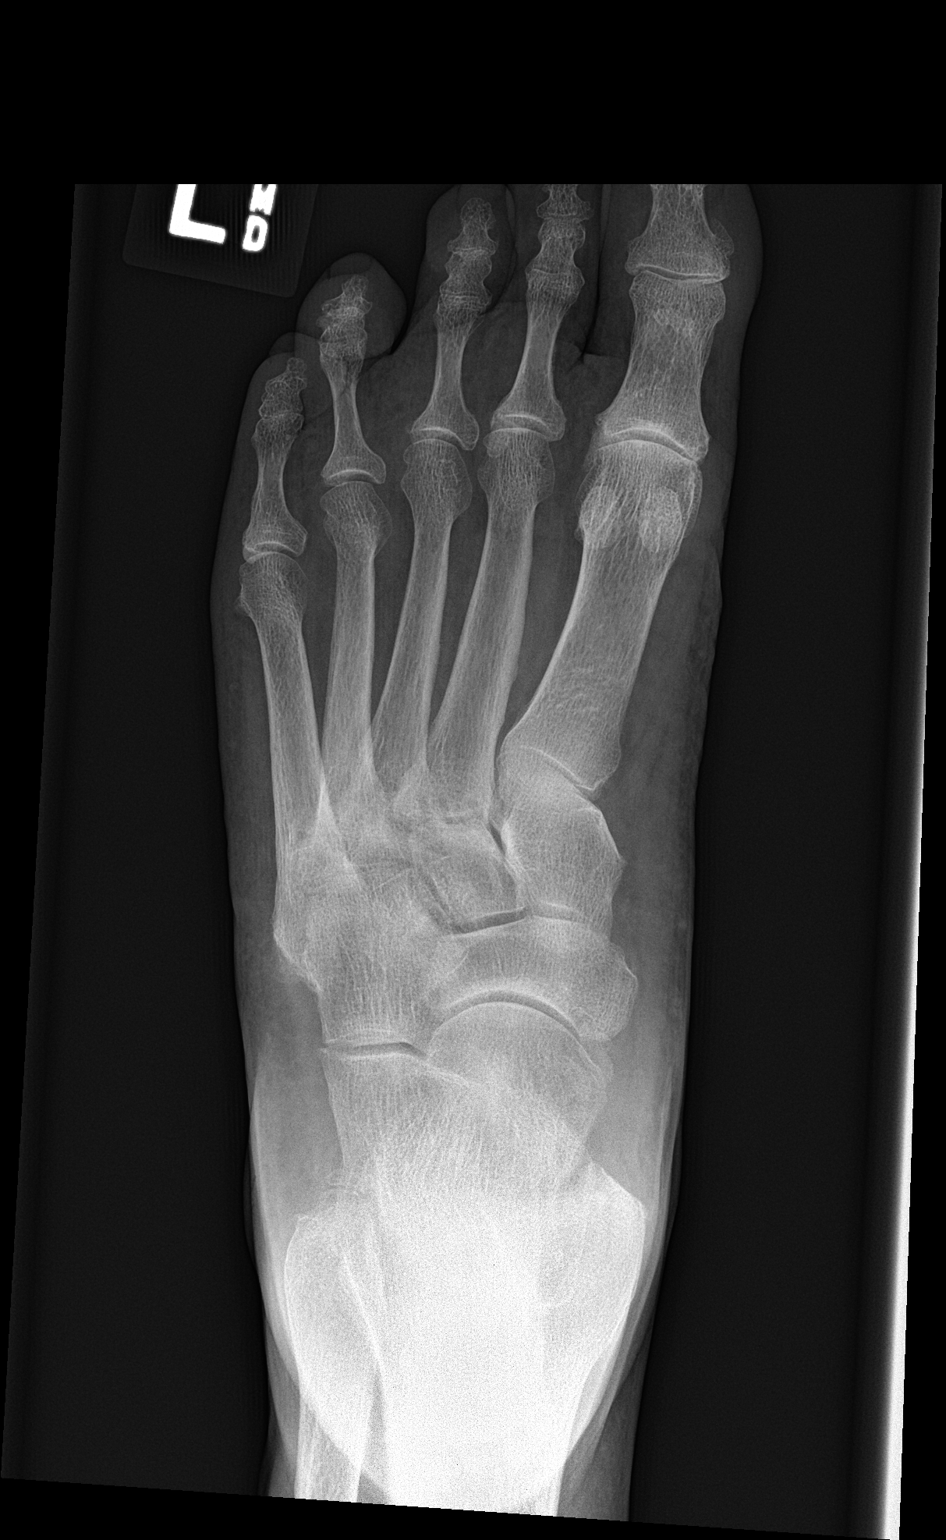

[foot lat]
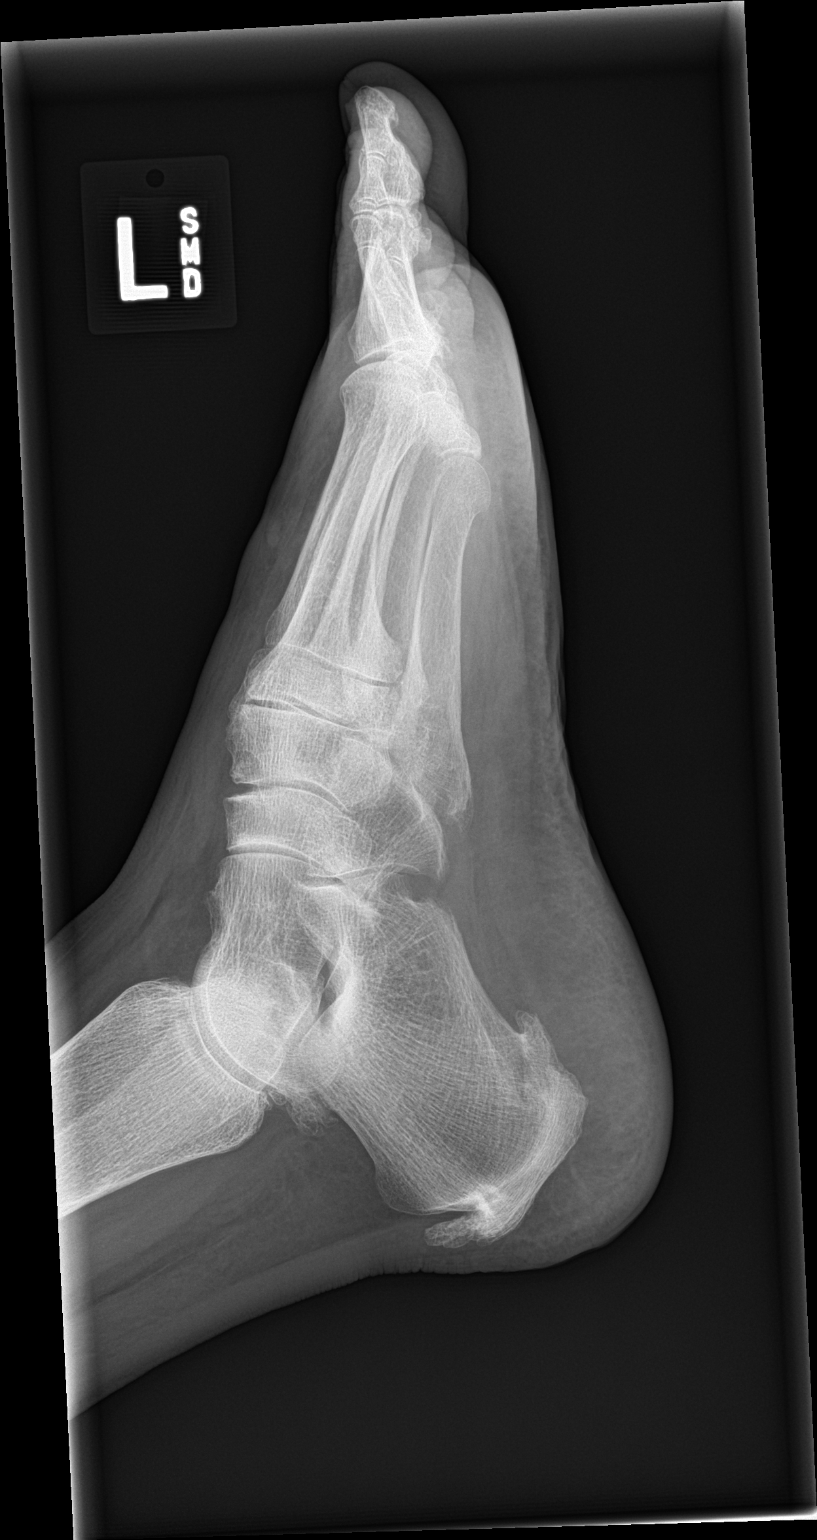

[2 of 2 positions shown; findings below may reference images not displayed]

FINDINGS: There is no evidence of fracture or dislocation. Mild degenerative
changes seen involving the first metatarsophalangeal joint. Mild
posterior calcaneal spurring is noted. Soft tissues are
unremarkable.
IMPRESSION: Chronic findings as described above. No acute abnormality seen in
the left foot.

## 2020-02-06 NOTE — Progress Notes (Signed)
Patient: Sophia Jackson  Service Category: E/M  Provider: Gaspar Cola, MD  DOB: 03-26-1961  DOS: 02/07/2020  Location: Office  MRN: [id number]  Setting: Ambulatory outpatient  Referring Provider: Zeb Comfort, MD  Type: Established Patient  Specialty: Interventional Pain Management  PCP: Zeb Comfort, MD  Location: Remote location  Delivery: TeleHealth     Virtual Encounter - Pain Management PROVIDER NOTE: Information contained herein reflects review and annotations entered in association with encounter. Interpretation of such information and data should be left to medically-trained personnel. Information provided to patient can be located elsewhere in the medical record under "Patient Instructions". Document created using STT-dictation technology, any transcriptional errors that may result from process are unintentional.    Contact & Pharmacy Preferred: [phone] Home: [phone] (home) Mobile: [phone] (mobile) E-mail: [email or web address]  [employer] [place] [facility] [place] [address] Phone: [phone] Fax: [phone]   Pre-screening  Ms. Mel Almond offered "in-person" vs "virtual" encounter. She indicated preferring virtual for this encounter.   Reason COVID-19*  Social distancing based on CDC and AMA recommendations.   I contacted Sophia Jackson on 02/07/2020 via telephone.      I clearly identified myself as Gaspar Cola, MD. I verified that I was speaking with the correct person using two identifiers (Name: HOLLYANN PABLO, and date of birth: 06-03-1961).  Consent I sought verbal advanced consent from Sophia Jackson for virtual visit interactions. I informed Ms. Mccamy of possible security and privacy concerns, risks, and limitations associated with providing "not-in-person" medical evaluation and management services. I also informed Ms. Slagel of the availability of "in-person" appointments. Finally, I informed her that there  would be a charge for the virtual visit and that she could be  personally, fully or partially, financially responsible for it. Ms. Sawchuk expressed understanding and agreed to proceed.   Historic Elements   [name] is a 59 y.o. year old, female patient evaluated today after her last contact with our practice on 10/20/2019. Sophia Jackson  has a past medical history of Acid reflux (10/27/2007), Arthritis (07/27/2007), Avitaminosis D (10/12/2012), Cataract, Depression, Gonalgia (05/30/2011), H/O cataract extraction (01/20/2015), History of migraine (08/30/2015), Hypertension, Migraines, Osteoarthritis, and Status post bariatric surgery (08/30/2015). She also  has a past surgical history that includes Abdominal hysterectomy; Bariatric Surgery; Wrist surgery; Knee surgery; and Tonsillectomy. Ms. Tegtmeyer has a current medication list which includes the following prescription(s): amlodipine, atenolol, vitamin d, diazepam, ferrous sulfate, hydrocodone-acetaminophen, losartan-hydrochlorothiazide, multivitamin with minerals, nortriptyline, omeprazole, sertraline, sumatriptan, tizanidine, ubrogepant, vitamin b-12, methylprednisolone, and UNABLE TO FIND. She  reports that she has never smoked. She has never used smokeless tobacco. She reports that she does not drink alcohol or use drugs. Ms. Schlichting is allergic to 2,4-d dimethylamine (amisol); diphenhydramine; ibuprofen; morphine and related; nsaids; tetanus toxoids; diclofenac; and buspirone.   HPI  Today, she is being contacted for medication management. The patient indicates doing well with the current medication regimen. No adverse reactions or side effects reported to the medications.  The patient indicates having had a problem with her right knee where she ended up going to the emergency room and she was provided with some oral steroids which did help with the pain.  I explained to the patient that they are a number of things and we can offer her with regards to  knee pain.  We reviewed her knee pain problems and she has indicated having had 1 prior  arthroscopic surgery on that right knee and having been told that eventually she may need a knee replacement.  We talked about steroid injections, Hyalgan knee injections, genicular nerve blocks, as well is genicular nerve radiofrequency ablation as options to treat this knee pain.  However, she has indicated that for some unknown reason to her [employer] and [employer] did not pay for any of her procedures and she has had to pay those out-of-pocket.  I have given her some recommendations as to things that she can do to check on the reason why that is happening.  In any case, we will continue therapy assist with time being.  Pharmacotherapy Assessment  Analgesic: Hydrocodone/APAP 7.5/325 one every 8 hours (22.5 mg/day of hydrocodone) MME/day:22.5 mg/day.   Monitoring: [place] PMP: PDMP reviewed during this encounter.       Pharmacotherapy: No side-effects or adverse reactions reported. Compliance: No problems identified. Effectiveness: Clinically acceptable. Plan: Refer to "POC".  UDS:  Summary  Date Value Ref Range Status  11/05/2018 FINAL  Final    Comment:    ==================================================================== TOXASSURE SELECT 13 (MW) ==================================================================== Test                             Result       Flag       Units Drug Present and Declared for Prescription Verification   Hydrocodone                    649          EXPECTED   ng/mg creat   Hydromorphone                  216          EXPECTED   ng/mg creat   Dihydrocodeine                 485          EXPECTED   ng/mg creat   Norhydrocodone                 1443         EXPECTED   ng/mg creat    Sources of hydrocodone include scheduled prescription    medications. Hydromorphone, dihydrocodeine and norhydrocodone are    expected metabolites of hydrocodone. Hydromorphone and     dihydrocodeine are also available as scheduled prescription    medications. Drug Absent but Declared for Prescription Verification   Diazepam                       Not Detected UNEXPECTED ng/mg creat ==================================================================== Test                      Result    Flag   Units      Ref Range   Creatinine              117              mg/dL      >=20 ==================================================================== Declared Medications:  The flagging and interpretation on this report are based on the  following declared medications.  Unexpected results may arise from  inaccuracies in the declared medications.  **Note: The testing scope of this panel includes these medications:  Diazepam  Hydrocodone (Norco)  **Note: The testing scope of this panel does not include following  reported medications:  Acetaminophen (Norco)  Amlodipine Besylate  Atenolol  Cyanocobalamin  Iron (Ferrous Sulfate)  Losartan (Losartan Potassium)  Multivitamin  Nortriptyline  Omeprazole  Sertraline  Sumatriptan  Tizanidine  Vitamin D ==================================================================== For clinical consultation, please call [phone]. ====================================================================    Laboratory Chemistry Profile   Renal Lab Results  Component Value Date   BUN 15 05/13/2019   CREATININE 1.06 (H) 05/13/2019   BCR 14 05/13/2019   GFRAA 68 05/13/2019   GFRNONAA 59 (L) 05/13/2019     Hepatic Lab Results  Component Value Date   AST 25 05/13/2019   ALT 18 05/23/2016   ALBUMIN 4.5 05/13/2019   ALKPHOS 126 (H) 05/13/2019     Electrolytes Lab Results  Component Value Date   NA 139 05/13/2019   K 3.7 05/13/2019   CL 98 05/13/2019   CALCIUM 9.7 05/13/2019   MG 2.2 05/13/2019     Bone Lab Results  Component Value Date   25OHVITD1 30 05/13/2019   25OHVITD2 <1.0 05/13/2019   25OHVITD3 30 05/13/2019      Inflammation (CRP: Acute Phase) (ESR: Chronic Phase) Lab Results  Component Value Date   CRP 21 (H) 05/13/2019   ESRSEDRATE 54 (H) 05/13/2019       Note: Above Lab results reviewed.  Imaging  DG Knee Complete 4 Views Right CLINICAL DATA:  Constant knee pain  EXAM: RIGHT KNEE - COMPLETE 4+ VIEW  COMPARISON:  12/01/2015  FINDINGS: Mild lateral deviation of the patella. Subarticular lucency at the lateral femoral condyle. Moderate arthritis of the lateral compartment. Mild arthritis of the medial compartment. Advanced arthritis of the patellofemoral compartment. Small knee effusion. No acute displaced fracture  IMPRESSION: 1. No acute osseous abnormality. 2. Tricompartment arthritis with probable small knee effusion. 3. Subarticular lucency at the lateral femoral condyle, possibly a subarticular geode or small osteochondral lesion.  Electronically Signed   By: Donavan Foil M.D.   On: 12/27/2019 16:00  Assessment  The encounter diagnosis was Chronic pain syndrome.  Plan of Care  Problem-specific:  No problem-specific Assessment & Plan notes found for this encounter.  Ms. SARIYA TRICKEY has a current medication list which includes the following long-term medication(s): amlodipine, atenolol, ferrous sulfate, hydrocodone-acetaminophen, losartan-hydrochlorothiazide, nortriptyline, omeprazole, sertraline, and sumatriptan.  Pharmacotherapy (Medications Ordered): No orders of the defined types were placed in this encounter.  Orders:  No orders of the defined types were placed in this encounter.  Follow-up plan:   Return in about 3 months (around 05/08/2020) for (VV), (MM).      Considering:   Diagnostic bilateral IA Hyalgan knee injections  Diagnostic bilateral IA steroid knee injections  Diagnostic bilateral genicular NB  Possible bilateral genicular RFA    Palliative PRN treatment(s):   None at this time       Recent Visits Date Type Provider Dept  11/10/19  Telemedicine Milinda Pointer, MD [facility]  Showing recent visits within past 90 days and meeting all other requirements   Future Appointments Date Type Provider Dept  02/07/20 Telemedicine Milinda Pointer, MD [facility]  Showing future appointments within next 90 days and meeting all other requirements   I discussed the assessment and treatment plan with the patient. The patient was provided an opportunity to ask questions and all were answered. The patient agreed with the plan and demonstrated an understanding of the instructions.  Patient advised to call back or seek an in-person evaluation if the symptoms or condition worsens.  Duration of encounter: 15 minutes.  Note by: Kathlen Brunswick  Dossie Arbour, MD Date: 02/07/2020; Time: 10:19 PM

## 2020-02-07 ENCOUNTER — Ambulatory Visit: Payer: BC Managed Care – PPO | Attending: Pain Medicine | Admitting: Pain Medicine

## 2020-02-07 ENCOUNTER — Other Ambulatory Visit: Payer: Self-pay

## 2020-02-07 DIAGNOSIS — G894 Chronic pain syndrome: Secondary | ICD-10-CM

## 2020-02-07 MED ORDER — HYDROCODONE-ACETAMINOPHEN 7.5-325 MG PO TABS: 1.0000 | ORAL_TABLET | Freq: Three times a day (TID) | ORAL | 0 refills | Status: DC | PRN

## 2020-02-09 ENCOUNTER — Telehealth: Payer: BC Managed Care – PPO | Admitting: Pain Medicine

## 2020-02-17 ENCOUNTER — Other Ambulatory Visit: Payer: Self-pay

## 2020-02-17 DIAGNOSIS — Z1231 Encounter for screening mammogram for malignant neoplasm of breast: Secondary | ICD-10-CM

## 2020-05-02 ENCOUNTER — Encounter: Payer: Self-pay | Admitting: Pain Medicine

## 2020-05-02 NOTE — Progress Notes (Signed)
Patient: Sophia Jackson  Service Category: E/M  Provider: Gaspar Cola, MD  DOB: 06-Jul-1961  DOS: 05/03/2020  Location: Office  MRN: [id number]  Setting: Ambulatory outpatient  Referring Provider: Zeb Comfort, MD  Type: Established Patient  Specialty: Interventional Pain Management  PCP: Zeb Comfort, MD  Location: Remote location  Delivery: TeleHealth     Virtual Encounter - Pain Management PROVIDER NOTE: Information contained herein reflects review and annotations entered in association with encounter. Interpretation of such information and data should be left to medically-trained personnel. Information provided to patient can be located elsewhere in the medical record under "Patient Instructions". Document created using STT-dictation technology, any transcriptional errors that may result from process are unintentional.    Contact & Pharmacy Preferred: [phone] Home: [phone] (home) Mobile: [phone] (mobile) E-mail: [email or web address]  [place] [place] [address] [place] [address] Phone: [phone] Fax: [phone]   Pre-screening  Sophia Jackson offered "in-person" vs "virtual" encounter. She indicated preferring virtual for this encounter.   Reason COVID-19*   Social distancing based on CDC and AMA recommendations.   I contacted Sophia Jackson on 05/03/2020 via telephone.      I clearly identified myself as Gaspar Cola, MD. I verified that I was speaking with the correct person using two identifiers (Name: Sophia Jackson, and date of birth: [birth year]).  Consent I sought verbal advanced consent from Sophia Jackson for virtual visit interactions. I informed Sophia Jackson of possible security and privacy concerns, risks, and limitations associated with providing "not-in-person" medical evaluation and management services. I also informed Sophia Jackson of the availability of "in-person" appointments. Finally, I informed her that  there would be a charge for the virtual visit and that she could be  personally, fully or partially, financially responsible for it. Sophia Jackson expressed understanding and agreed to proceed.   Historic Elements   Sophia Jackson is a 59 y.o. year old, female patient evaluated today after her last contact with our practice on Visit date not found. Sophia Jackson  has a past medical history of Acid reflux (10/27/2007), Arthritis (07/27/2007), Avitaminosis D (10/12/2012), Cataract, Depression, Gonalgia (05/30/2011), H/O cataract extraction (01/20/2015), History of migraine (08/30/2015), Hypertension, Migraines, Osteoarthritis, and Status post bariatric surgery (08/30/2015). She also  has a past surgical history that includes Abdominal hysterectomy; Bariatric Surgery; Wrist surgery; Knee surgery; and Tonsillectomy. Sophia Jackson has a current medication list which includes the following prescription(s): amlodipine, atenolol, calcium carbonate, vitamin d, diazepam, ferrous sulfate, hydrocodone-acetaminophen, losartan-hydrochlorothiazide, multivitamin with minerals, nortriptyline, omeprazole, sertraline, sumatriptan, tizanidine, and vitamin b-12. She  reports that she has never smoked. She has never used smokeless tobacco. She reports that she does not drink alcohol and does not use drugs. Sophia Jackson is allergic to 2,4-d dimethylamine (amisol); diphenhydramine; ibuprofen; morphine and related; nsaids; tetanus toxoids; diclofenac; and buspirone.   HPI  Today, she is being contacted for medication management. The patient indicates doing well with the current medication regimen. No adverse reactions or side effects reported to the medications.  However, the patient indicates having some issues with her headaches which she is getting treated by a neurologist at the [facility] headache clinic.  She describes a recent flareup of the headache as having started 3 to 4 days ago, being bilateral, (L>R).  She also indicates that  she treated them with Imitrex, nortriptyline, and tizanidine.  The location of the headaches is frontal and she denies any occipital  symptoms or neck pain.  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  She is being worked up for this.  Today I also took the opportunity to talk to her about cervicogenic headaches, just to be sure that that is not what she was going to.  After our discussion, it was clear that were dealing with a "migraine type" headache.  I have deferred the treatment of that to her neurologist.  Pharmacotherapy Assessment  Analgesic: Hydrocodone/APAP 7.5/325 one every 8 hours (22.5 mg/day of hydrocodone) MME/day:22.5 mg/day.   Monitoring:  PMP: PDMP reviewed during this encounter.       Pharmacotherapy: No side-effects or adverse reactions reported. Compliance: No problems identified. Effectiveness: Clinically acceptable. Plan: Refer to "POC".  UDS:  Summary  Date Value Ref Range Status  11/05/2018 FINAL  Final    Comment:    ==================================================================== TOXASSURE SELECT 13 (MW) ==================================================================== Test                             Result       Flag       Units Drug Present and Declared for Prescription Verification   Hydrocodone                    649          EXPECTED   ng/mg creat   Hydromorphone                  216          EXPECTED   ng/mg creat   Dihydrocodeine                 485          EXPECTED   ng/mg creat   Norhydrocodone                 1443         EXPECTED   ng/mg creat    Sources of hydrocodone include scheduled prescription    medications. Hydromorphone, dihydrocodeine and norhydrocodone are    expected metabolites of hydrocodone. Hydromorphone and    dihydrocodeine are also available as scheduled prescription    medications. Drug Absent but Declared for Prescription Verification    Diazepam                       Not Detected UNEXPECTED ng/mg creat ==================================================================== Test                      Result    Flag   Units      Ref Range   Creatinine              117              mg/dL      >=20 ==================================================================== Declared Medications:  The flagging and interpretation on this report are based on the  following declared medications.  Unexpected results may arise from  inaccuracies in the declared medications.  **Note: The testing scope of this panel includes these medications:  Diazepam  Hydrocodone (Norco)  **Note: The testing scope of this panel does not include following  reported medications:  Acetaminophen (Norco)  Amlodipine Besylate  Atenolol  Cyanocobalamin  Iron (Ferrous Sulfate)  Losartan (Losartan Potassium)  Multivitamin  Nortriptyline  Omeprazole  Sertraline  Sumatriptan  Tizanidine  Vitamin D ==================================================================== For clinical consultation, please call [phone]. ====================================================================     Laboratory Chemistry Profile   Renal Lab Results  Component Value Date   BUN 15 05/13/2019   CREATININE 1.06 (H) 05/13/2019   BCR 14 05/13/2019   GFRAA 68 05/13/2019   GFRNONAA 59 (L) 05/13/2019     Hepatic Lab Results  Component Value Date   AST 25 05/13/2019   ALT 18 05/23/2016   ALBUMIN 4.5 05/13/2019   ALKPHOS 126 (H) 05/13/2019     Electrolytes Lab Results  Component Value Date   NA 139 05/13/2019   K 3.7 05/13/2019   CL 98 05/13/2019   CALCIUM 9.7 05/13/2019   MG 2.2 05/13/2019     Bone Lab Results  Component Value Date   25OHVITD1 30 05/13/2019   25OHVITD2 <1.0 05/13/2019   25OHVITD3 30 05/13/2019     Inflammation (CRP: Acute Phase) (ESR: Chronic Phase) Lab Results  Component Value Date   CRP 21 (H) 05/13/2019   ESRSEDRATE 54 (H)  05/13/2019       Note: Above Lab results reviewed.   Imaging  DG Knee Complete 4 Views Right CLINICAL DATA:  Constant knee pain  EXAM: RIGHT KNEE - COMPLETE 4+ VIEW  COMPARISON:  12/01/2015  FINDINGS: Mild lateral deviation of the patella. Subarticular lucency at the lateral femoral condyle. Moderate arthritis of the lateral compartment. Mild arthritis of the medial compartment. Advanced arthritis of the patellofemoral compartment. Small knee effusion. No acute displaced fracture  IMPRESSION: 1. No acute osseous abnormality. 2. Tricompartment arthritis with probable small knee effusion. 3. Subarticular lucency at the lateral femoral condyle, possibly a subarticular geode or small osteochondral lesion.  Electronically Signed   By: Donavan Foil M.D.   On: 12/27/2019 16:00  Assessment  The primary encounter diagnosis was Chronic pain syndrome. Diagnoses of Chronic knee pain (1ry area of Pain) (Bilateral) (L>R), Osteoarthritis of knee (Bilateral) (L>R), and Pharmacologic therapy were also pertinent to this visit.  Plan of Care  Problem-specific:  No problem-specific Assessment & Plan notes found for this encounter.  Sophia Jackson has a current medication list which includes the following long-term medication(s): amlodipine, atenolol, ferrous sulfate, hydrocodone-acetaminophen, losartan-hydrochlorothiazide, nortriptyline, omeprazole, sertraline, and sumatriptan.  Pharmacotherapy (Medications Ordered): No orders of the defined types were placed in this encounter.  Orders:  No orders of the defined types were placed in this encounter.  Follow-up plan:   Return in about 5 weeks (around 06/07/2020) for (F2F), (MM) to evaluate results of UDS.      Considering:   Diagnostic bilateral IA Hyalgan knee injections  Diagnostic bilateral IA steroid knee injections  Diagnostic bilateral genicular NB  Possible bilateral genicular RFA    Palliative PRN treatment(s):   None  at this time     Recent Visits Date Type Provider Dept  02/07/20 Telemedicine Milinda Pointer, MD [facility]  Showing recent visits within past 90 days and meeting all other requirements Today's Visits Date Type Provider Dept  05/03/20 Telemedicine Milinda Pointer, MD [facility]  Showing today's visits and meeting all other requirements Future Appointments No visits were found meeting these conditions. Showing future appointments within next 90 days and meeting all other requirements  I discussed the assessment and treatment plan with the patient. The patient was provided an opportunity to ask questions and all were answered. The patient agreed with the plan and demonstrated an understanding of the instructions.  Patient advised to call back or seek an in-person  evaluation if the symptoms or condition worsens.  Duration of encounter: 22 minutes.  Note by: Gaspar Cola, MD Date: 05/03/2020; Time: 7:34 AM

## 2020-05-03 ENCOUNTER — Other Ambulatory Visit: Payer: Self-pay

## 2020-05-03 ENCOUNTER — Ambulatory Visit: Payer: BC Managed Care – PPO | Attending: Pain Medicine | Admitting: Pain Medicine

## 2020-05-03 DIAGNOSIS — M170 Bilateral primary osteoarthritis of knee: Secondary | ICD-10-CM | POA: Diagnosis not present

## 2020-05-03 DIAGNOSIS — G894 Chronic pain syndrome: Secondary | ICD-10-CM

## 2020-05-03 DIAGNOSIS — M25561 Pain in right knee: Secondary | ICD-10-CM | POA: Diagnosis not present

## 2020-05-03 DIAGNOSIS — M25562 Pain in left knee: Secondary | ICD-10-CM

## 2020-05-03 DIAGNOSIS — G8929 Other chronic pain: Secondary | ICD-10-CM

## 2020-05-03 DIAGNOSIS — Z79899 Other long term (current) drug therapy: Secondary | ICD-10-CM

## 2020-05-03 MED ORDER — HYDROCODONE-ACETAMINOPHEN 7.5-325 MG PO TABS: 1.0000 | ORAL_TABLET | Freq: Three times a day (TID) | ORAL | 0 refills | Status: DC | PRN

## 2020-05-10 LAB — TOXASSURE SELECT 13 (MW), URINE

## 2020-06-06 NOTE — Progress Notes (Signed)
PROVIDER NOTE: Information contained herein reflects review and annotations entered in association with encounter. Interpretation of such information and data should be left to medically-trained personnel. Information provided to patient can be located elsewhere in the medical record under "Patient Instructions". Document created using STT-dictation technology, any transcriptional errors that may result from process are unintentional.    Patient: Sophia Jackson  Service Category: E/M  Provider: Gaspar Cola, MD  DOB: 1961/03/03  DOS: 06/07/2020  Specialty: Interventional Pain Management  MRN: [id number]  Setting: Ambulatory outpatient  PCP: Sophia Boys, MD  Type: Established Patient    Referring Provider: Zeb Comfort, MD  Location: Office  Delivery: Face-to-face     HPI  Reason for encounter: [name], a 59 y.o. year old female, is here today for evaluation and management of her Chronic pain syndrome [G89.4]. Ms. Seals primary complain today is Knee Pain (bilateral) Last encounter: Practice (Visit date not found). My last encounter with her was on Visit date not found. Pertinent problems: Ms. Bricco has Knee joint pain; Osteoarthritis; Common migraine; Myofascial pain; Muscle spasms of lower extremity; Chronic pain syndrome; Osteoarthritis of knee (Bilateral) (L>R); Chronic migraine; Chronic knee pain (1ry area of Pain) (Bilateral) (L>R); Migraine without aura; Chronic musculoskeletal pain; Arthropathy of knee (Right); and Tricompartment osteoarthritis of knee (Right) on their pertinent problem list. Pain Assessment: Severity of Chronic pain is reported as a 4 /10. Location: Knee Left, Right/denies. Onset: More than a month ago. Quality: Sharp. Timing: Constant. Modifying factor(s): ice, medication, rest. Vitals:  height is _0  (1.676 m) and weight is 250 lb (113.4 kg). Her temporal temperature is 97.1 F (36.2 C) (abnormal). Her blood pressure is 136/72 and her pulse is 80.  Her respiration is 17 and oxygen saturation is 100%.    The patient indicates doing well with the current medication regimen. No adverse reactions or side effects reported to the medications.  Review of our medical records indicate that despite the fact that we have communicated to all of our patients as we see them under initial evaluation that we do not take patients for medication management only, this patient has avoided interventional therapies since before 2016.  She has a history of chronic bilateral knee pain and lately she has been experiencing more problems on the right side.  Today I talked to the patient about the possibility of doing some intra-articular knee injections, but again she has indicated that she prefers not to.  Since she is really not taking advantage of what we actually specializing, we will be talking to her about transferring her medication regimen back to her primary care physician or being referred to a facility that does medication management only.  The patient's last UDS was within normal limits.  She has continued to be compliant with the medication rules and regulations, but since we lost our nurse practitioner, he has been rather difficult to manage our interventional responsibilities in addition to managing controlled substances.   Pharmacotherapy Assessment   Analgesic: Hydrocodone/APAP 7.5/325 one every 8 hours (22.5 mg/day of hydrocodone) MME/day:22.5 mg/day.   Monitoring: [place] PMP: PDMP reviewed during this encounter.       Pharmacotherapy: No side-effects or adverse reactions reported. Compliance: No problems identified. Effectiveness: Clinically acceptable.  Chauncey Fischer, RN  06/07/2020  8:21 AM  Sign when Signing Visit Nursing Pain Medication Assessment:  Safety precautions to be maintained throughout the outpatient stay will include: orient to surroundings, keep bed in low position, maintain call  bell within reach at all times, provide assistance with  transfer out of bed and ambulation.  Medication Inspection Compliance: Pill count conducted under aseptic conditions, in front of the patient. Neither the pills nor the bottle was removed from the patient's sight at any time. Once count was completed pills were immediately returned to the patient in their original bottle.  Medication: Hydrocodone/APAP Pill/Patch Count: 4 of 90 pills remain Pill/Patch Appearance: Markings consistent with prescribed medication Bottle Appearance: Standard pharmacy container. Clearly labeled. Filled Date: 7 / 6 / 21 Last Medication intake:  TodaySafety precautions to be maintained throughout the outpatient stay will include: orient to surroundings, keep bed in low position, maintain call bell within reach at all times, provide assistance with transfer out of bed and ambulation.     UDS:  Summary  Date Value Ref Range Status  05/03/2020 Note  Corrected    Comment:    ==================================================================== [employer] (MW) ==================================================================== Test                             Result       Flag       Units  Drug Present and Declared for Prescription Verification   Oxazepam                       88           EXPECTED   ng/mg creat   Temazepam                      77           EXPECTED   ng/mg creat    Oxazepam and temazepam are expected metabolites of diazepam.    Oxazepam is also an expected metabolite of other benzodiazepine    drugs, including chlordiazepoxide, prazepam, clorazepate, halazepam,    and temazepam.  Oxazepam and temazepam are available as scheduled    prescription medications.    Hydrocodone                    1735         EXPECTED   ng/mg creat   Hydromorphone                  220          EXPECTED   ng/mg creat   Dihydrocodeine                 319          EXPECTED   ng/mg creat   Norhydrocodone                 2548         EXPECTED   ng/mg creat     Sources of hydrocodone include scheduled prescription medications.    Hydromorphone, dihydrocodeine and norhydrocodone are expected    metabolites of hydrocodone. Hydromorphone and dihydrocodeine are    also available as scheduled prescription medications.  ==================================================================== Test                      Result    Flag   Units      Ref Range   Creatinine              101              mg/dL      >=20 ====================================================================  Declared Medications:  The flagging and interpretation on this report are based on the  following declared medications.  Unexpected results may arise from  inaccuracies in the declared medications.   **Note: The testing scope of this panel includes these medications:   Diazepam (Valium)  Hydrocodone (Norco)   **Note: The testing scope of this panel does not include the  following reported medications:   Acetaminophen (Norco)  Amlodipine (Norvasc)  Atenolol (Tenormin)  Calcium  Cholecalciferol  Hydrochlorothiazide (Hyzaar)  Iron  Losartan (Hyzaar)  Multivitamin  Nortriptyline (Pamelor)  Omeprazole (Prilosec)  Sertraline (Zoloft)  Sumatriptan (Imitrex)  Tizanidine (Zanaflex)  Vitamin B12 ==================================================================== For clinical consultation, please call [phone]. ====================================================================      ROS  Constitutional: Denies any fever or chills Gastrointestinal: No reported hemesis, hematochezia, vomiting, or acute GI distress Musculoskeletal: Denies any acute onset joint swelling, redness, loss of ROM, or weakness Neurological: No reported episodes of acute onset apraxia, aphasia, dysarthria, agnosia, amnesia, paralysis, loss of coordination, or loss of consciousness  Medication Review  HYDROcodone-acetaminophen, SUMAtriptan, Vitamin D, amLODipine, atenolol, calcium  carbonate, diazepam, ferrous sulfate, losartan-hydrochlorothiazide, multivitamin with minerals, nortriptyline, omeprazole, sertraline, tiZANidine, and vitamin B-12  History Review  Allergy: Ms. Busk is allergic to 2,4-d dimethylamine (amisol); diphenhydramine; ibuprofen; morphine and related; nsaids; tetanus toxoids; diclofenac; and buspirone. Drug: Ms. Schubring  reports no history of drug use. Alcohol:  reports no history of alcohol use. Tobacco:  reports that she has never smoked. She has never used smokeless tobacco. Social: Ms. Prada  reports that she has never smoked. She has never used smokeless tobacco. She reports that she does not drink alcohol and does not use drugs. Medical:  has a past medical history of Acid reflux (10/27/2007), Arthritis (07/27/2007), Avitaminosis D (10/12/2012), Cataract, Depression, Gonalgia (05/30/2011), H/O cataract extraction (01/20/2015), History of migraine (08/30/2015), Hypertension, Migraines, Osteoarthritis, and Status post bariatric surgery (08/30/2015). Surgical: Ms. Rutkowski  has a past surgical history that includes Abdominal hysterectomy; Bariatric Surgery; Wrist surgery; Knee surgery; and Tonsillectomy. Family: family history includes Cancer in her mother; Heart disease in her father.  Laboratory Chemistry Profile   Renal Lab Results  Component Value Date   BUN 15 05/13/2019   CREATININE 1.06 (H) 05/13/2019   BCR 14 05/13/2019   GFRAA 68 05/13/2019   GFRNONAA 59 (L) 05/13/2019     Hepatic Lab Results  Component Value Date   AST 25 05/13/2019   ALT 18 05/23/2016   ALBUMIN 4.5 05/13/2019   ALKPHOS 126 (H) 05/13/2019     Electrolytes Lab Results  Component Value Date   NA 139 05/13/2019   K 3.7 05/13/2019   CL 98 05/13/2019   CALCIUM 9.7 05/13/2019   MG 2.2 05/13/2019     Bone Lab Results  Component Value Date   25OHVITD1 30 05/13/2019   25OHVITD2 <1.0 05/13/2019   25OHVITD3 30 05/13/2019     Inflammation (CRP: Acute Phase) (ESR:  Chronic Phase) Lab Results  Component Value Date   CRP 21 (H) 05/13/2019   ESRSEDRATE 54 (H) 05/13/2019       Note: Above Lab results reviewed.  Recent Imaging Review  DG Knee Complete 4 Views Right CLINICAL DATA:  Constant knee pain  EXAM: RIGHT KNEE - COMPLETE 4+ VIEW  COMPARISON:  12/01/2015  FINDINGS: Mild lateral deviation of the patella. Subarticular lucency at the lateral femoral condyle. Moderate arthritis of the lateral compartment. Mild arthritis of the medial compartment. Advanced arthritis of the patellofemoral compartment. Small knee effusion. No acute displaced fracture  IMPRESSION: 1. No acute osseous abnormality. 2. Tricompartment arthritis with probable small knee effusion. 3. Subarticular lucency at the lateral femoral condyle, possibly a subarticular geode or small osteochondral lesion.  Electronically Signed   By: Donavan Foil M.D.   On: 12/27/2019 16:00 Note: Reviewed        Physical Exam  General appearance: Well nourished, well developed, and well hydrated. In no apparent acute distress Mental status: Alert, oriented x 3 (person, place, & time)       Respiratory: No evidence of acute respiratory distress Eyes: PERLA Vitals: BP 136/72 (BP Location: Right Arm, Patient Position: Sitting, Cuff Size: Large)   Pulse 80   Temp (!) 97.1 F (36.2 C) (Temporal)   Resp 17   Ht _0  (1.676 m)   Wt 250 lb (113.4 kg)   SpO2 100%   BMI 40.35 kg/m  BMI: Estimated body mass index is 40.35 kg/m as calculated from the following:   Height as of this encounter: _1  (1.676 m).   Weight as of this encounter: 250 lb (113.4 kg). Ideal: Ideal body weight: 59.3 kg (130 lb 11.7 oz) Adjusted ideal body weight: 80.9 kg (178 lb 7 oz)  Assessment   Status Diagnosis  Controlled Controlled Controlled 1. Chronic pain syndrome   2. Chronic knee pain (1ry area of Pain) (Bilateral) (L>R)   3. Arthropathy of knee (Right)   4. Osteoarthritis of knee (Bilateral)  (L>R)   5. Tricompartment osteoarthritis of knee (Right)   6. Pharmacologic therapy   7. Uncomplicated opioid dependence ([place])      Updated Problems: Problem  Arthropathy of knee (Right)   IMPRESSION: 1. No acute osseous abnormality. 2. Tricompartment arthritis with probable small knee effusion. 3. Subarticular lucency at the lateral femoral condyle, possibly a subarticular geode or small osteochondral lesion.   Tricompartment osteoarthritis of knee (Right)  Common Migraine   Overview:  Common Migraine (Without Aura)  Formatting of this note might be different from the original. Common Migraine (Without Aura)   Uncomplicated Opioid Dependence ([facility])  Impaired Fasting Glucose   Overview:  Impaired Fasting Glucose  Formatting of this note might be different from the original. Impaired Fasting Glucose   Hypertension   Formatting of this note might be different from the original. Hypertension   Allergic Rhinitis   Formatting of this note might be different from the original. Allergic Rhinitis   Esophageal Reflux   Overview:  Esophageal Reflux  Formatting of this note might be different from the original. Esophageal Reflux   Depression   Overview:  Depression  Formatting of this note might be different from the original. Depression   Peptic Ulcer   Overview:  Peptic Ulcer  Formatting of this note might be different from the original. Peptic Ulcer     Plan of Care  Problem-specific:  No problem-specific Assessment & Plan notes found for this encounter.  Ms. VENEZIA SARGEANT has a current medication list which includes the following long-term medication(s): amlodipine, atenolol, ferrous sulfate, losartan-hydrochlorothiazide, nortriptyline, omeprazole, sertraline, sumatriptan, [START ON 06/08/2020] hydrocodone-acetaminophen, [START ON 07/08/2020] hydrocodone-acetaminophen, and [START ON 08/07/2020] hydrocodone-acetaminophen.  Pharmacotherapy (Medications  Ordered): Meds ordered this encounter  Medications  . HYDROcodone-acetaminophen (NORCO) 7.5-325 MG tablet    Sig: Take 1 tablet by mouth every 8 (eight) hours as needed for severe pain. Must last 30 days    Dispense:  90 tablet    Refill:  0    Chronic Pain: STOP Act (Not applicable) Fill 1 day early  if closed on refill date. Do not fill until: 06/08/2020. To last until: 07/08/2020. Avoid benzodiazepines within 8 hours of opioids  . HYDROcodone-acetaminophen (NORCO) 7.5-325 MG tablet    Sig: Take 1 tablet by mouth every 8 (eight) hours as needed for severe pain. Must last 30 days    Dispense:  90 tablet    Refill:  0    Chronic Pain: STOP Act (Not applicable) Fill 1 day early if closed on refill date. Do not fill until: 07/08/2020. To last until: 08/07/2020. Avoid benzodiazepines within 8 hours of opioids  . HYDROcodone-acetaminophen (NORCO) 7.5-325 MG tablet    Sig: Take 1 tablet by mouth every 8 (eight) hours as needed for severe pain. Must last 30 days    Dispense:  90 tablet    Refill:  0    Chronic Pain: STOP Act (Not applicable) Fill 1 day early if closed on refill date. Do not fill until: 08/07/2020. To last until: 09/06/2020. Avoid benzodiazepines within 8 hours of opioids   Orders:  No orders of the defined types were placed in this encounter.  Follow-up plan:   Return in about 13 weeks (around 09/06/2020) for F2F encounter, 20-min, MM (on eval day) to discuss possibility of transfer to PCP.      Considering:   Diagnostic bilateral IA Hyalgan knee injections  Diagnostic bilateral IA steroid knee injections  Diagnostic bilateral genicular NB  Possible bilateral genicular RFA    Palliative PRN treatment(s):   None at this time     Recent Visits Date Type Provider Dept  05/03/20 Telemedicine Milinda Pointer, MD [facility]  Showing recent visits within past 90 days and meeting all other requirements Today's Visits Date Type Provider Dept  06/07/20 Office Visit  Milinda Pointer, MD [facility]  Showing today's visits and meeting all other requirements Future Appointments Date Type Provider Dept  08/23/20 Appointment Milinda Pointer, MD [facility]  Showing future appointments within next 90 days and meeting all other requirements  I discussed the assessment and treatment plan with the patient. The patient was provided an opportunity to ask questions and all were answered. The patient agreed with the plan and demonstrated an understanding of the instructions.  Patient advised to call back or seek an in-person evaluation if the symptoms or condition worsens.  Duration of encounter: 60 minutes.  Note by: Gaspar Cola, MD Date: 06/07/2020; Time: 5:13 PM

## 2020-06-07 ENCOUNTER — Ambulatory Visit: Payer: BC Managed Care – PPO | Attending: Pain Medicine | Admitting: Pain Medicine

## 2020-06-07 ENCOUNTER — Encounter: Payer: Self-pay | Admitting: Pain Medicine

## 2020-06-07 ENCOUNTER — Other Ambulatory Visit: Payer: Self-pay

## 2020-06-07 VITALS — BP 136/72 | HR 80 | Temp 97.1°F | Resp 17 | Ht 66.0 in | Wt 250.0 lb

## 2020-06-07 DIAGNOSIS — M170 Bilateral primary osteoarthritis of knee: Secondary | ICD-10-CM | POA: Insufficient documentation

## 2020-06-07 DIAGNOSIS — M25562 Pain in left knee: Secondary | ICD-10-CM

## 2020-06-07 DIAGNOSIS — G894 Chronic pain syndrome: Secondary | ICD-10-CM | POA: Diagnosis present

## 2020-06-07 DIAGNOSIS — Z79899 Other long term (current) drug therapy: Secondary | ICD-10-CM | POA: Insufficient documentation

## 2020-06-07 DIAGNOSIS — M1711 Unilateral primary osteoarthritis, right knee: Secondary | ICD-10-CM | POA: Diagnosis not present

## 2020-06-07 DIAGNOSIS — G8929 Other chronic pain: Secondary | ICD-10-CM | POA: Diagnosis present

## 2020-06-07 DIAGNOSIS — M25561 Pain in right knee: Secondary | ICD-10-CM | POA: Diagnosis present

## 2020-06-07 DIAGNOSIS — F1120 Opioid dependence, uncomplicated: Secondary | ICD-10-CM

## 2020-06-07 MED ORDER — HYDROCODONE-ACETAMINOPHEN 7.5-325 MG PO TABS: 1.0000 | ORAL_TABLET | Freq: Three times a day (TID) | ORAL | 0 refills | Status: DC | PRN

## 2020-06-07 NOTE — Patient Instructions (Signed)
____________________________________________________________________________________________  Preparing for your procedure (without sedation)  Procedure appointments are limited to planned procedures: . No Prescription Refills. . No disability issues will be discussed. . No medication changes will be discussed.  Instructions: . Oral Intake: Do not eat or drink anything for at least 6 hours prior to your procedure. (Exception: Blood Pressure Medication. See below.) . Transportation: Unless otherwise stated by your physician, you may drive yourself after the procedure. . Blood Pressure Medicine: Do not forget to take your blood pressure medicine with a sip of water the morning of the procedure. If your Diastolic (lower reading)is above 100 mmHg, elective cases will be cancelled/rescheduled. . Blood thinners: These will need to be stopped for procedures. Notify our staff if you are taking any blood thinners. Depending on which one you take, there will be specific instructions on how and when to stop it. . Diabetics on insulin: Notify the staff so that you can be scheduled 1st case in the morning. If your diabetes requires high dose insulin, take only  of your normal insulin dose the morning of the procedure and notify the staff that you have done so. . Preventing infections: Shower with an antibacterial soap the morning of your procedure.  . Build-up your immune system: Take 1000 mg of Vitamin C with every meal (3 times a day) the day prior to your procedure. . Antibiotics: Inform the staff if you have a condition or reason that requires you to take antibiotics before dental procedures. . Pregnancy: If you are pregnant, call and cancel the procedure. . Sickness: If you have a cold, fever, or any active infections, call and cancel the procedure. . Arrival: You must be in the facility at least 30 minutes prior to your scheduled procedure. . Children: Do not bring any children with you. . Dress  appropriately: Bring dark clothing that you would not mind if they get stained. . Valuables: Do not bring any jewelry or valuables.  Reasons to call and reschedule or cancel your procedure: (Following these recommendations will minimize the risk of a serious complication.) . Surgeries: Avoid having procedures within 2 weeks of any surgery. (Avoid for 2 weeks before or after any surgery). . Flu Shots: Avoid having procedures within 2 weeks of a flu shots or . (Avoid for 2 weeks before or after immunizations). . Barium: Avoid having a procedure within 7-10 days after having had a radiological study involving the use of radiological contrast. (Myelograms, Barium swallow or enema study). . Heart attacks: Avoid any elective procedures or surgeries for the initial 6 months after a "Myocardial Infarction" (Heart Attack). . Blood thinners: It is imperative that you stop these medications before procedures. Let us know if you if you take any blood thinner.  . Infection: Avoid procedures during or within two weeks of an infection (including chest colds or gastrointestinal problems). Symptoms associated with infections include: Localized redness, fever, chills, night sweats or profuse sweating, burning sensation when voiding, cough, congestion, stuffiness, runny nose, sore throat, diarrhea, nausea, vomiting, cold or Flu symptoms, recent or current infections. It is specially important if the infection is over the area that we intend to treat. . Heart and lung problems: Symptoms that may suggest an active cardiopulmonary problem include: cough, chest pain, breathing difficulties or shortness of breath, dizziness, ankle swelling, uncontrolled high or unusually low blood pressure, and/or palpitations. If you are experiencing any of these symptoms, cancel your procedure and contact your primary care physician for an evaluation.  Remember:  Regular   Business hours are:  Monday to Thursday 8:00 AM to 4:00  PM  Provider's Schedule: Goerge Mohr, MD:  Procedure days: Tuesday and Thursday 7:30 AM to 4:00 PM  Bilal Lateef, MD:  Procedure days: Monday and Wednesday 7:30 AM to 4:00 PM ____________________________________________________________________________________________    ____________________________________________________________________________________________  Drug Holidays (Slow)  What is a "Drug Holiday"? Drug Holiday: is the name given to the period of time during which a patient stops taking a medication(s) for the purpose of eliminating tolerance to the drug.  Benefits . Improved effectiveness of opioids. . Decreased opioid dose needed to achieve benefits. . Improved pain with lesser dose.  What is tolerance? Tolerance: is the progressive decreased in effectiveness of a drug due to its repetitive use. With repetitive use, the body gets use to the medication and as a consequence, it loses its effectiveness. This is a common problem seen with opioid pain medications. As a result, a larger dose of the drug is needed to achieve the same effect that used to be obtained with a smaller dose.  How long should a "Drug Holiday" last? You should stay off of the pain medicine for at least 14 consecutive days. (2 weeks)  Should I stop the medicine "cold turkey"? No. You should always coordinate with your Pain Specialist so that he/she can provide you with the correct medication dose to make the transition as smoothly as possible.  How do I stop the medicine? Slowly. You will be instructed to decrease the daily amount of pills that you take by one (1) pill every seven (7) days. This is called a "slow downward taper" of your dose. For example: if you normally take four (4) pills per day, you will be asked to drop this dose to three (3) pills per day for seven (7) days, then to two (2) pills per day for seven (7) days, then to one (1) per day for seven (7) days, and at the end of those  last seven (7) days, this is when the "Drug Holiday" would start.   Will I have withdrawals? By doing a "slow downward taper" like this one, it is unlikely that you will experience any significant withdrawal symptoms. Typically, what triggers withdrawals is the sudden stop of a high dose opioid therapy. Withdrawals can usually be avoided by slowly decreasing the dose over a prolonged period of time. If you do not follow these instructions and decide to stop your medication abruptly, withdrawals may be possible.  What are withdrawals? Withdrawals: refers to the wide range of symptoms that occur after stopping or dramatically reducing opiate drugs after heavy and prolonged use. Withdrawal symptoms do not occur to patients that use low dose opioids, or those who take the medication sporadically. Contrary to benzodiazepine (example: Valium, Xanax, etc.) or alcohol withdrawals ("Delirium Tremens"), opioid withdrawals are not lethal. Withdrawals are the physical manifestation of the body getting rid of the excess receptors.  Expected Symptoms Early symptoms of withdrawal may include: . Agitation . Anxiety . Muscle aches . Increased tearing . Insomnia . Runny nose . Sweating . Yawning  Late symptoms of withdrawal may include: . Abdominal cramping . Diarrhea . Dilated pupils . Goose bumps . Nausea . Vomiting  Will I experience withdrawals? Due to the slow nature of the taper, it is very unlikely that you will experience any.  What is a slow taper? Taper: refers to the gradual decrease in dose.  (Last update: 05/24/2020) ____________________________________________________________________________________________    ____________________________________________________________________________________________  Medication Rules  Purpose:   To inform patients, and their family members, of our rules and regulations.  Applies to: All patients receiving prescriptions (written or  electronic).  Pharmacy of record: Pharmacy where electronic prescriptions will be sent. If written prescriptions are taken to a different pharmacy, please inform the nursing staff. The pharmacy listed in the electronic medical record should be the one where you would like electronic prescriptions to be sent.  Electronic prescriptions: In compliance with the [place] Strengthen Opioid Misuse Prevention (STOP) Act of 2017 (Session Law 2017-74/H243), effective November 04, 2018, all controlled substances must be electronically prescribed. Calling prescriptions to the pharmacy will cease to exist.  Prescription refills: Only during scheduled appointments. Applies to all prescriptions.  NOTE: The following applies primarily to controlled substances (Opioid* Pain Medications).   Type of encounter (visit): For patients receiving controlled substances, face-to-face visits are required. (Not an option or up to the patient.)  Patient's responsibilities: 1. Pain Pills: Bring all pain pills to every appointment (except for procedure appointments). 2. Pill Bottles: Bring pills in original pharmacy bottle. Always bring the newest bottle. Bring bottle, even if empty. 3. Medication refills: You are responsible for knowing and keeping track of what medications you take and those you need refilled. The day before your appointment: write a list of all prescriptions that need to be refilled. The day of the appointment: give the list to the admitting nurse. Prescriptions will be written only during appointments. No prescriptions will be written on procedure days. If you forget a medication: it will not be "Called in", "Faxed", or "electronically sent". You will need to get another appointment to get these prescribed. No early refills. Do not call asking to have your prescription filled early. 4. Prescription Accuracy: You are responsible for carefully inspecting your prescriptions before leaving our office. Have  the discharge nurse carefully go over each prescription with you, before taking them home. Make sure that your name is accurately spelled, that your address is correct. Check the name and dose of your medication to make sure it is accurate. Check the number of pills, and the written instructions to make sure they are clear and accurate. Make sure that you are given enough medication to last until your next medication refill appointment. 5. Taking Medication: Take medication as prescribed. When it comes to controlled substances, taking less pills or less frequently than prescribed is permitted and encouraged. Never take more pills than instructed. Never take medication more frequently than prescribed.  6. Inform other Doctors: Always inform, all of your healthcare providers, of all the medications you take. 7. Pain Medication from other Providers: You are not allowed to accept any additional pain medication from any other Doctor or Healthcare provider. There are two exceptions to this rule. (see below) In the event that you require additional pain medication, you are responsible for notifying us, as stated below. 8. Medication Agreement: You are responsible for carefully reading and following our Medication Agreement. This must be signed before receiving any prescriptions from our practice. Safely store a copy of your signed Agreement. Violations to the Agreement will result in no further prescriptions. (Additional copies of our Medication Agreement are available upon request.) 9. Laws, Rules, & Regulations: All patients are expected to follow all Federal and State Laws, Statutes, Rules, & Regulations. Ignorance of the Laws does not constitute a valid excuse.  10. Illegal drugs and Controlled Substances: The use of illegal substances (including, but not limited to marijuana and its derivatives) and/or the illegal use of   any controlled substances is strictly prohibited. Violation of this rule may result in the  immediate and permanent discontinuation of any and all prescriptions being written by our practice. The use of any illegal substances is prohibited. 11. Adopted CDC guidelines & recommendations: Target dosing levels will be at or below 60 MME/day. Use of benzodiazepines** is not recommended.  Exceptions: There are only two exceptions to the rule of not receiving pain medications from other Healthcare Providers. 1. Exception #1 (Emergencies): In the event of an emergency (i.e.: accident requiring emergency care), you are allowed to receive additional pain medication. However, you are responsible for: As soon as you are able, call our office [phone], at any time of the day or night, and leave a message stating your name, the date and nature of the emergency, and the name and dose of the medication prescribed. In the event that your call is answered by a member of our staff, make sure to document and save the date, time, and the name of the person that took your information.  2. Exception #2 (Planned Surgery): In the event that you are scheduled by another doctor or dentist to have any type of surgery or procedure, you are allowed (for a period no longer than 30 days), to receive additional pain medication, for the acute post-op pain. However, in this case, you are responsible for picking up a copy of our "Post-op Pain Management for Surgeons" handout, and giving it to your surgeon or dentist. This document is available at our office, and does not require an appointment to obtain it. Simply go to our office during business hours (Monday-Thursday from 8:00 AM to 4:00 PM) (Friday 8:00 AM to 12:00 Noon) or if you have a scheduled appointment with us, prior to your surgery, and ask for it by name. In addition, you will need to provide us with your name, name of your surgeon, type of surgery, and date of procedure or surgery.  *Opioid medications include: morphine, codeine, oxycodone, oxymorphone, hydrocodone,  hydromorphone, meperidine, tramadol, tapentadol, buprenorphine, fentanyl, methadone. **Benzodiazepine medications include: diazepam (Valium), alprazolam (Xanax), clonazepam (Klonopine), lorazepam (Ativan), clorazepate (Tranxene), chlordiazepoxide (Librium), estazolam (Prosom), oxazepam (Serax), temazepam (Restoril), triazolam (Halcion) (Last updated: 01/01/2018) ____________________________________________________________________________________________   ____________________________________________________________________________________________  Medication Recommendations and Reminders  Applies to: All patients receiving prescriptions (written and/or electronic).  Medication Rules & Regulations: These rules and regulations exist for your safety and that of others. They are not flexible and neither are we. Dismissing or ignoring them will be considered "non-compliance" with medication therapy, resulting in complete and irreversible termination of such therapy. (See document titled "Medication Rules" for more details.) In all conscience, because of safety reasons, we cannot continue providing a therapy where the patient does not follow instructions.  Pharmacy of record:   Definition: This is the pharmacy where your electronic prescriptions will be sent.   We do not endorse any particular pharmacy, however, we have experienced problems with [facility] not securing enough medication supply for the community.  We do not restrict you in your choice of pharmacy. However, once we write for your prescriptions, we will NOT be re-sending more prescriptions to fix restricted supply problems created by your pharmacy, or your insurance.   The pharmacy listed in the electronic medical record should be the one where you want electronic prescriptions to be sent.  If you choose to change pharmacy, simply notify our nursing staff.  Recommendations:  Keep all of your pain medications in a safe place, under  lock   and key, even if you live alone. We will NOT replace lost, stolen, or damaged medication.  After you fill your prescription, take 1 week's worth of pills and put them away in a safe place. You should keep a separate, properly labeled bottle for this purpose. The remainder should be kept in the original bottle. Use this as your primary supply, until it runs out. Once it's gone, then you know that you have 1 week's worth of medicine, and it is time to come in for a prescription refill. If you do this correctly, it is unlikely that you will ever run out of medicine.  To make sure that the above recommendation works, it is very important that you make sure your medication refill appointments are scheduled at least 1 week before you run out of medicine. To do this in an effective manner, make sure that you do not leave the office without scheduling your next medication management appointment. Always ask the nursing staff to show you in your prescription , when your medication will be running out. Then arrange for the receptionist to get you a return appointment, at least 7 days before you run out of medicine. Do not wait until you have 1 or 2 pills left, to come in. This is very poor planning and does not take into consideration that we may need to cancel appointments due to bad weather, sickness, or emergencies affecting our staff.  DO NOT ACCEPT A "Partial Fill": If for any reason your pharmacy does not have enough pills/tablets to completely fill or refill your prescription, do not allow for a "partial fill". The law allows the pharmacy to complete that prescription within 72 hours, without requiring a new prescription. If they do not fill the rest of your prescription within those 72 hours, you will need a separate prescription to fill the remaining amount, which we will NOT provide. If the reason for the partial fill is your insurance, you will need to talk to the pharmacist about payment alternatives for  the remaining tablets, but again, DO NOT ACCEPT A PARTIAL FILL, unless you can trust your pharmacist to obtain the remainder of the pills within 72 hours.  Prescription refills and/or changes in medication(s):   Prescription refills, and/or changes in dose or medication, will be conducted only during scheduled medication management appointments. (Applies to both, written and electronic prescriptions.)  No refills on procedure days. No medication will be changed or started on procedure days. No changes, adjustments, and/or refills will be conducted on a procedure day. Doing so will interfere with the diagnostic portion of the procedure.  No phone refills. No medications will be "called into the pharmacy".  No Fax refills.  No weekend refills.  No Holliday refills.  No after hours refills.  Remember:  Business hours are:  Monday to Thursday 8:00 AM to 4:00 PM Provider's Schedule: Shary Lamos, MD - Appointments are:  Medication management: Monday and Wednesday 8:00 AM to 4:00 PM Procedure day: Tuesday and Thursday 7:30 AM to 4:00 PM Bilal Lateef, MD - Appointments are:  Medication management: Tuesday and Thursday 8:00 AM to 4:00 PM Procedure day: Monday and Wednesday 7:30 AM to 4:00 PM (Last update: 05/24/2020) ____________________________________________________________________________________________   ____________________________________________________________________________________________  CANNABIDIOL (AKA: CBD Oil or Pills)  Applies to: All patients receiving prescriptions of controlled substances (written and/or electronic).  General Information: Cannabidiol (CBD), a derivative of Marijuana, was discovered in 1940. It is one of some 113 identified cannabinoids in cannabis (Marijuana) plants, accounting for   up to 40% of the plant's extract. As of 2018, preliminary clinical research on cannabidiol included studies of anxiety, cognition, movement disorders, and  pain.  Cannabidiol is consummed in multiple ways, including inhalation of cannabis smoke or vapor, as an aerosol spray into the cheek, and by mouth. It may be supplied as CBD oil containing CBD as the active ingredient (no added tetrahydrocannabinol (THC) or terpenes), a full-plant CBD-dominant hemp extract oil, capsules, dried cannabis, or as a liquid solution. CBD is thought not have the same psychoactivity as THC, and may affect the actions of THC. Studies suggest that CBD may interact with different biological targets, including cannabinoid receptors and other neurotransmitter receptors. As of 2018 the mechanism of action for its biological effects has not been determined.  In the [place], cannabidiol has a limited approval by the Food and Drug Administration (FDA) for treatment of only two types of epilepsy disorders. The side effects of long-term use of the drug include somnolence, decreased appetite, diarrhea, fatigue, malaise, weakness, sleeping problems, and others.  CBD remains a Schedule I drug prohibited for any use.  Legality: Some manufacturers ship CBD products nationally, an illegal action which the FDA has not enforced in 2018, with CBD remaining the subject of an FDA investigational new drug evaluation, and is not considered legal as a dietary supplement or food ingredient as of December 2018. Federal illegality has made it difficult historically to conduct research on CBD. CBD is openly sold in head shops and health food stores in some states where such sales have not been explicitly legalized.  Warning: Because it is not FDA approved for general use or treatment of pain, it is not required to undergo the same manufacturing controls as prescription drugs.  This means that the available cannabidiol (CBD) may be contaminated with THC.  If this is the case, it will trigger a positive urine drug screen (UDS) test for cannabinoids (Marijuana).  Because a positive UDS for illicit  substances is a violation of our medication agreement, your opioid analgesics (pain medicine) may be permanently discontinued. (Last update: 05/24/2020) ____________________________________________________________________________________________    

## 2020-06-07 NOTE — Progress Notes (Signed)
Nursing Pain Medication Assessment:  Safety precautions to be maintained throughout the outpatient stay will include: orient to surroundings, keep bed in low position, maintain call bell within reach at all times, provide assistance with transfer out of bed and ambulation.  Medication Inspection Compliance: Pill count conducted under aseptic conditions, in front of the patient. Neither the pills nor the bottle was removed from the patient's sight at any time. Once count was completed pills were immediately returned to the patient in their original bottle.  Medication: Hydrocodone/APAP Pill/Patch Count: 4 of 90 pills remain Pill/Patch Appearance: Markings consistent with prescribed medication Bottle Appearance: Standard pharmacy container. Clearly labeled. Filled Date: 7 / 6 / 21 Last Medication intake:  TodaySafety precautions to be maintained throughout the outpatient stay will include: orient to surroundings, keep bed in low position, maintain call bell within reach at all times, provide assistance with transfer out of bed and ambulation.  

## 2020-08-07 ENCOUNTER — Other Ambulatory Visit: Payer: Self-pay | Admitting: Pain Medicine

## 2020-08-07 DIAGNOSIS — G894 Chronic pain syndrome: Secondary | ICD-10-CM

## 2020-08-22 NOTE — Progress Notes (Signed)
PROVIDER NOTE: Information contained herein reflects review and annotations entered in association with encounter. Interpretation of such information and data should be left to medically-trained personnel. Information provided to patient can be located elsewhere in the medical record under "Patient Instructions". Document created using STT-dictation technology, any transcriptional errors that may result from process are unintentional.    Patient: Sophia Jackson  Service Category: E/M  Provider: Gaspar Cola, MD  DOB: Jan 17, 1961  DOS: 08/23/2020  Specialty: Interventional Pain Management  MRN: [id number]  Setting: Ambulatory outpatient  PCP: Barbaraann Boys, MD  Type: Established Patient    Referring Provider: Barbaraann Boys, MD  Location: Office  Delivery: Face-to-face     HPI  [name], a 59 y.o. year old female, is here today because of her Chronic pain syndrome [G89.4]. Ms. Sausedo primary complain today is Knee Pain (bilateral) Last encounter: My last encounter with her was on 08/07/2020. Pertinent problems: Ms. Gao has Knee joint pain; Osteoarthritis; Common migraine; Myofascial pain; Muscle spasms of lower extremity; Chronic pain syndrome; Osteoarthritis of knee (Bilateral) (L>R); Chronic migraine; Chronic knee pain (1ry area of Pain) (Bilateral) (L>R); Migraine without aura; Chronic musculoskeletal pain; Arthropathy of knee (Right); and Tricompartment osteoarthritis of knee (Right) on their pertinent problem list. Pain Assessment: Severity of Chronic pain is reported as a 6 /10. Location: Knee Right, Left/radiates throughtout entire leg. Onset: More than a month ago. Quality: Aching, Sharp. Timing: Constant. Modifying factor(s): ice, meds and rest. Vitals:  height is '5\' 6"'  (1.676 m) and weight is 250 lb (113.4 kg). Her temperature is 97.1 F (36.2 C) (abnormal). Her blood pressure is 153/92 (abnormal) and her pulse is 68. Her respiration is 18 and oxygen saturation is 98%.    Reason for encounter: medication management.  The patient indicates doing well with the current medication regimen. No adverse reactions or side effects reported to the medications.  Today I have reminded the patient that as an interventional pain management practice, she needs to keep in mind that we do have therapies that may improve her knee pain.  The patient indicated that she is unable to do that due to the cost of her visits.  Since this seemed to be a problem with her, I reminded her that it will probably be a lot less expensive for her to follow-up with her primary care physician for her medication management.  However, she indicated that they will not write for opioid analgesics.  Since she indicated that there was nothing that she could do about it, active the opportunity to correct her remind her that still is in pain management pointed out that bringing the BMI to a level below 30 would significantly lower the progression of her osteoarthritis and her chronic pain.  I pointed this out to the patient because of so although I am aware that she has had bariatric surgery and has lost over 200 pounds, her current BMI is 40.35 kg/m.  Unfortunately despite the fact that I was trying to help the patient understand the situation, she indicated that at one point she had lowered her weight to 160 pounds and she still had physicians telling her that she needed to go lower, which apparently discouraged her significantly and upset her.  Today I clearly saw that as the patient did get upset because I touched on the subject of her weight.  She became tearful when I had to clarify to her that she simply needed to strive to keep her BMI at 30  or below.  To assist her with this today I have made a referral to medical weight management to assist her in this matter.  Sincerely, I do not believe that she will follow-up with that referral, but at least have extended a hand to assist her with this problem. The patient  had an x-ray of the right knee knee 12/27/2019 at an urgent care visit. RTCB: 12/05/2020 The patient has been coming to this practice since 08/30/2015 and has not ever used any over interventional therapies.  Today I took the opportunity to remind the patient that as part of the medication management service we we will also be taking care of any type of interventional therapy that she may need in terms of her pain management.  I reminded her that if any of her physicians recommend for her to have any type of nerve blocks or joint injections, that she should bring it up to also that we can do that for them and then send them a report.  She understood and accepted.  Pharmacotherapy Assessment   Analgesic: Hydrocodone/APAP 7.5/325 one every 8 hours (22.5 mg/day of hydrocodone) MME/day:22.5 mg/day.   Monitoring: [place] PMP: PDMP reviewed during this encounter.       Pharmacotherapy: No side-effects or adverse reactions reported. Compliance: No problems identified. Effectiveness: Clinically acceptable.  Dewayne Shorter, RN  08/23/2020  8:00 AM  Signed Nursing Pain Medication Assessment:  Safety precautions to be maintained throughout the outpatient stay will include: orient to surroundings, keep bed in low position, maintain call bell within reach at all times, provide assistance with transfer out of bed and ambulation.  Medication Inspection Compliance: Pill count conducted under aseptic conditions, in front of the patient. Neither the pills nor the bottle was removed from the patient's sight at any time. Once count was completed pills were immediately returned to the patient in their original bottle.  Medication: Hydrocodone/APAP Pill/Patch Count: 42 of 90 pills remain Pill/Patch Appearance: Markings consistent with prescribed medication Bottle Appearance: Standard pharmacy container. Clearly labeled. Filled Date: 10 / 04 / 2021 Last Medication intake:  Yesterday    UDS:  Summary  Date Value Ref  Range Status  05/03/2020 Note  Corrected    Comment:    ==================================================================== [employer] [facility] (MW) ==================================================================== Test                             Result       Flag       Units  Drug Present and Declared for Prescription Verification   Oxazepam                       88           EXPECTED   ng/mg creat   Temazepam                      77           EXPECTED   ng/mg creat    Oxazepam and temazepam are expected metabolites of diazepam.    Oxazepam is also an expected metabolite of other benzodiazepine    drugs, including chlordiazepoxide, prazepam, clorazepate, halazepam,    and temazepam.  Oxazepam and temazepam are available as scheduled    prescription medications.    Hydrocodone                    1735  EXPECTED   ng/mg creat   Hydromorphone                  220          EXPECTED   ng/mg creat   Dihydrocodeine                 319          EXPECTED   ng/mg creat   Norhydrocodone                 2548         EXPECTED   ng/mg creat    Sources of hydrocodone include scheduled prescription medications.    Hydromorphone, dihydrocodeine and norhydrocodone are expected    metabolites of hydrocodone. Hydromorphone and dihydrocodeine are    also available as scheduled prescription medications.  ==================================================================== Test                      Result    Flag   Units      Ref Range   Creatinine              101              mg/dL      >=20 ==================================================================== Declared Medications:  The flagging and interpretation on this report are based on the  following declared medications.  Unexpected results may arise from  inaccuracies in the declared medications.   **Note: The testing scope of this panel includes these medications:   Diazepam (Valium)  Hydrocodone (Norco)   **Note: The testing  scope of this panel does not include the  following reported medications:   Acetaminophen (Norco)  Amlodipine (Norvasc)  Atenolol (Tenormin)  Calcium  Cholecalciferol  Hydrochlorothiazide (Hyzaar)  Iron  Losartan (Hyzaar)  Multivitamin  Nortriptyline (Pamelor)  Omeprazole (Prilosec)  Sertraline (Zoloft)  Sumatriptan (Imitrex)  Tizanidine (Zanaflex)  Vitamin B12 ==================================================================== For clinical consultation, please call [phone]. ====================================================================      ROS  Constitutional: Denies any fever or chills Gastrointestinal: No reported hemesis, hematochezia, vomiting, or acute GI distress Musculoskeletal: Denies any acute onset joint swelling, redness, loss of ROM, or weakness Neurological: No reported episodes of acute onset apraxia, aphasia, dysarthria, agnosia, amnesia, paralysis, loss of coordination, or loss of consciousness  Medication Review  HYDROcodone-acetaminophen, SUMAtriptan, Vitamin D, amLODipine, atenolol, calcium carbonate, diazepam, ferrous sulfate, losartan-hydrochlorothiazide, multivitamin with minerals, nortriptyline, omeprazole, sertraline, tiZANidine, and vitamin B-12  History Review  Allergy: Ms. Magner is allergic to 2,4-d dimethylamine (amisol); diphenhydramine; ibuprofen; morphine and related; nsaids; tetanus toxoids; diclofenac; and buspirone. Drug: Ms. Niese  reports no history of drug use. Alcohol:  reports no history of alcohol use. Tobacco:  reports that she has never smoked. She has never used smokeless tobacco. Social: Ms. Gaughran  reports that she has never smoked. She has never used smokeless tobacco. She reports that she does not drink alcohol and does not use drugs. Medical:  has a past medical history of Acid reflux (10/27/2007), Arthritis (07/27/2007), Avitaminosis D (10/12/2012), Cataract, Depression, Gonalgia (05/30/2011), H/O cataract  extraction (01/20/2015), History of migraine (08/30/2015), Hypertension, Migraines, Osteoarthritis, and Status post bariatric surgery (08/30/2015). Surgical: Ms. Boch  has a past surgical history that includes Abdominal hysterectomy; Bariatric Surgery; Wrist surgery; Knee surgery; and Tonsillectomy. Family: family history includes Cancer in her mother; Heart disease in her father.  Laboratory Chemistry Profile   Renal Lab Results  Component Value Date   BUN 15 05/13/2019   CREATININE  1.06 (H) 05/13/2019   BCR 14 05/13/2019   GFRAA 68 05/13/2019   GFRNONAA 59 (L) 05/13/2019     Hepatic Lab Results  Component Value Date   AST 25 05/13/2019   ALT 18 05/23/2016   ALBUMIN 4.5 05/13/2019   ALKPHOS 126 (H) 05/13/2019     Electrolytes Lab Results  Component Value Date   NA 139 05/13/2019   K 3.7 05/13/2019   CL 98 05/13/2019   CALCIUM 9.7 05/13/2019   MG 2.2 05/13/2019     Bone Lab Results  Component Value Date   25OHVITD1 30 05/13/2019   25OHVITD2 <1.0 05/13/2019   25OHVITD3 30 05/13/2019     Inflammation (CRP: Acute Phase) (ESR: Chronic Phase) Lab Results  Component Value Date   CRP 21 (H) 05/13/2019   ESRSEDRATE 54 (H) 05/13/2019       Note: Above Lab results reviewed.  Recent Imaging Review  DG Knee Complete 4 Views Right CLINICAL DATA:  Constant knee pain  EXAM: RIGHT KNEE - COMPLETE 4+ VIEW  COMPARISON:  12/01/2015  FINDINGS: Mild lateral deviation of the patella. Subarticular lucency at the lateral femoral condyle. Moderate arthritis of the lateral compartment. Mild arthritis of the medial compartment. Advanced arthritis of the patellofemoral compartment. Small knee effusion. No acute displaced fracture  IMPRESSION: 1. No acute osseous abnormality. 2. Tricompartment arthritis with probable small knee effusion. 3. Subarticular lucency at the lateral femoral condyle, possibly a subarticular geode or small osteochondral lesion.  Electronically  Signed   By: Donavan Foil M.D.   On: 12/27/2019 16:00 Note: Reviewed        Physical Exam  General appearance: Well nourished, well developed, and well hydrated. In no apparent acute distress Mental status: Alert, oriented x 3 (person, place, & time)       Respiratory: No evidence of acute respiratory distress Eyes: PERLA Vitals: BP (!) 153/92   Pulse 68   Temp (!) 97.1 F (36.2 C)   Resp 18   Ht '5\' 6"'  (1.676 m)   Wt 250 lb (113.4 kg)   SpO2 98%   BMI 40.35 kg/m  BMI: Estimated body mass index is 40.35 kg/m as calculated from the following:   Height as of this encounter: '5\' 6"'  (1.676 m).   Weight as of this encounter: 250 lb (113.4 kg). Ideal: Ideal body weight: 59.3 kg (130 lb 11.7 oz) Adjusted ideal body weight: 80.9 kg (178 lb 7 oz)  Assessment   Status Diagnosis  Controlled Controlled Controlled 1. Chronic pain syndrome   2. Chronic knee pain (1ry area of Pain) (Bilateral) (L>R)   3. Osteoarthritis of knee (Bilateral) (L>R)   4. Tricompartment osteoarthritis of knee (Right)   5. Pharmacologic therapy   6. Class 3 severe obesity due to excess calories with serious comorbidity and body mass index (BMI) of 40.0 to 44.9 in adult ([place])   7. S/P gastric bypass   8. Type 2 diabetes mellitus treated without insulin ([place])   9. Hypertension, unspecified type   10. Gastroesophageal reflux disease without esophagitis      Updated Problems: Problem  Class 3 Severe Obesity Due to Excess Calories With Serious Comorbidity and Body Mass Index (Bmi) of 40.0 to 44.9 in Adult ([facility])    Plan of Care  Problem-specific:  No problem-specific Assessment & Plan notes found for this encounter.  Ms. MALEIYA PERGOLA has a current medication list which includes the following long-term medication(s): amlodipine, atenolol, ferrous sulfate, losartan-hydrochlorothiazide, nortriptyline, omeprazole, sertraline, sumatriptan, [START  ON 09/06/2020] hydrocodone-acetaminophen, [START ON 10/06/2020]  hydrocodone-acetaminophen, and [START ON 11/05/2020] hydrocodone-acetaminophen.  Pharmacotherapy (Medications Ordered): Meds ordered this encounter  Medications  . HYDROcodone-acetaminophen (NORCO) 7.5-325 MG tablet    Sig: Take 1 tablet by mouth every 8 (eight) hours as needed for severe pain. Must last 30 days    Dispense:  90 tablet    Refill:  0    Chronic Pain: STOP Act (Not applicable) Fill 1 day early if closed on refill date. Avoid benzodiazepines within 8 hours of opioids  . HYDROcodone-acetaminophen (NORCO) 7.5-325 MG tablet    Sig: Take 1 tablet by mouth every 8 (eight) hours as needed for severe pain. Must last 30 days    Dispense:  90 tablet    Refill:  0    Chronic Pain: STOP Act (Not applicable) Fill 1 day early if closed on refill date. Avoid benzodiazepines within 8 hours of opioids  . HYDROcodone-acetaminophen (NORCO) 7.5-325 MG tablet    Sig: Take 1 tablet by mouth every 8 (eight) hours as needed for severe pain. Must last 30 days    Dispense:  90 tablet    Refill:  0    Chronic Pain: STOP Act (Not applicable) Fill 1 day early if closed on refill date. Avoid benzodiazepines within 8 hours of opioids   Orders:  Orders Placed This Encounter  Procedures  . Amb Ref to Medical Weight Management    Referral Priority:   Routine    Referral Type:   Consultation    Referral Reason:   Specialty Services Required    Number of Visits Requested:   1   Follow-up plan:   Return in about 3 months (around 12/05/2020) for (F2F), (Med Mgmt).      Considering:   Diagnostic bilateral IA Hyalgan knee injections  Diagnostic bilateral IA steroid knee injections  Diagnostic bilateral genicular NB  Possible bilateral genicular RFA    Palliative PRN treatment(s):   None at this time      Recent Visits Date Type Provider Dept  06/07/20 Office Visit Milinda Pointer, MD [facility]  Showing recent visits within past 90 days and meeting all other requirements Today's  Visits Date Type Provider Dept  08/23/20 Office Visit Milinda Pointer, MD [facility]  Showing today's visits and meeting all other requirements Future Appointments No visits were found meeting these conditions. Showing future appointments within next 90 days and meeting all other requirements  I discussed the assessment and treatment plan with the patient. The patient was provided an opportunity to ask questions and all were answered. The patient agreed with the plan and demonstrated an understanding of the instructions.  Patient advised to call back or seek an in-person evaluation if the symptoms or condition worsens.  Duration of encounter: 30 minutes.  Note by: Gaspar Cola, MD Date: 08/23/2020; Time: 8:30 AM

## 2020-08-23 ENCOUNTER — Ambulatory Visit: Payer: BC Managed Care – PPO | Attending: Pain Medicine | Admitting: Pain Medicine

## 2020-08-23 ENCOUNTER — Other Ambulatory Visit: Payer: Self-pay

## 2020-08-23 ENCOUNTER — Encounter: Payer: Self-pay | Admitting: Pain Medicine

## 2020-08-23 VITALS — BP 153/92 | HR 68 | Temp 97.1°F | Resp 18 | Ht 66.0 in | Wt 250.0 lb

## 2020-08-23 DIAGNOSIS — Z9884 Bariatric surgery status: Secondary | ICD-10-CM | POA: Insufficient documentation

## 2020-08-23 DIAGNOSIS — M1711 Unilateral primary osteoarthritis, right knee: Secondary | ICD-10-CM | POA: Insufficient documentation

## 2020-08-23 DIAGNOSIS — G894 Chronic pain syndrome: Secondary | ICD-10-CM

## 2020-08-23 DIAGNOSIS — Z6841 Body Mass Index (BMI) 40.0 and over, adult: Secondary | ICD-10-CM | POA: Diagnosis present

## 2020-08-23 DIAGNOSIS — M25561 Pain in right knee: Secondary | ICD-10-CM | POA: Diagnosis present

## 2020-08-23 DIAGNOSIS — Z79899 Other long term (current) drug therapy: Secondary | ICD-10-CM | POA: Diagnosis present

## 2020-08-23 DIAGNOSIS — M25562 Pain in left knee: Secondary | ICD-10-CM | POA: Diagnosis present

## 2020-08-23 DIAGNOSIS — M170 Bilateral primary osteoarthritis of knee: Secondary | ICD-10-CM | POA: Diagnosis present

## 2020-08-23 DIAGNOSIS — E119 Type 2 diabetes mellitus without complications: Secondary | ICD-10-CM | POA: Diagnosis present

## 2020-08-23 DIAGNOSIS — I10 Essential (primary) hypertension: Secondary | ICD-10-CM | POA: Insufficient documentation

## 2020-08-23 DIAGNOSIS — K219 Gastro-esophageal reflux disease without esophagitis: Secondary | ICD-10-CM | POA: Diagnosis present

## 2020-08-23 DIAGNOSIS — G8929 Other chronic pain: Secondary | ICD-10-CM | POA: Diagnosis present

## 2020-08-23 MED ORDER — HYDROCODONE-ACETAMINOPHEN 7.5-325 MG PO TABS: 1.0000 | ORAL_TABLET | Freq: Three times a day (TID) | ORAL | 0 refills | Status: DC | PRN

## 2020-08-23 NOTE — Patient Instructions (Signed)
____________________________________________________________________________________________  CBD (cannabidiol) WARNING  Applicable to: All individuals currently taking or considering taking CBD (cannabidiol) and, more important, all patients taking opioid analgesic controlled substances (pain medication). (Example: oxycodone; oxymorphone; hydrocodone; hydromorphone; morphine; methadone; tramadol; tapentadol; fentanyl; buprenorphine; butorphanol; dextromethorphan; meperidine; codeine; etc.)  Legal status: CBD remains a Schedule I drug prohibited for any use. CBD is illegal with one exception. In the [place], CBD has a limited Transport planner (FDA) approval for the treatment of two specific types of epilepsy disorders. Only one CBD product has been approved by the FDA for this purpose: "Epidiolex". FDA is aware that some companies are marketing products containing cannabis and cannabis-derived compounds in ways that violate the [employer], Drug and Cosmetic Act [facility] Act) and that may put the health and safety of consumers at risk. The FDA, a Federal agency, has not enforced the CBD status since 2018.   Legality: Some manufacturers ship CBD products nationally, which is illegal. Often such products are sold online and are therefore available throughout the country. CBD is openly sold in head shops and health food stores in some states where such sales have not been explicitly legalized. Selling unapproved products with unsubstantiated therapeutic claims is not only a violation of the law, but also can put patients at risk, as these products have not been proven to be safe or effective. Federal illegality makes it difficult to conduct research on CBD.  Reference: "FDA Regulation of Cannabis and Cannabis-Derived Products, Including Cannabidiol (CBD)" -  [employer].pt  Warning: CBD is not FDA approved and has not undergo the same manufacturing controls as prescription drugs.  This means that the purity and safety of available CBD may be questionable. Most of the time, despite manufacturer's claims, it is contaminated with THC (delta-9-tetrahydrocannabinol - the chemical in marijuana responsible for the "HIGH").  When this is the case, the [facility] contaminant will trigger a positive urine drug screen (UDS) test for Marijuana (carboxy-THC). Because a positive UDS for any illicit substance is a violation of our medication agreement, your opioid analgesics (pain medicine) may be permanently discontinued.  MORE ABOUT CBD  General Information: CBD  is a derivative of the Marijuana (cannabis sativa) plant discovered in 81. It is one of the 113 identified substances found in Marijuana. It accounts for up to 40% of the plant's extract. As of 2018, preliminary clinical studies on CBD included research for the treatment of anxiety, movement disorders, and pain. CBD is available and consumed in multiple forms, including inhalation of smoke or vapor, as an aerosol spray, and by mouth. It may be supplied as an oil containing CBD, capsules, dried cannabis, or as a liquid solution. CBD is thought not to be as psychoactive as THC (delta-9-tetrahydrocannabinol - the chemical in marijuana responsible for the "HIGH"). Studies suggest that CBD may interact with different biological target receptors in the body, including cannabinoid and other neurotransmitter receptors. As of 2018 the mechanism of action for its biological effects has not been determined.  Side-effects  Adverse reactions: Dry mouth, diarrhea, decreased appetite, fatigue, drowsiness, malaise, weakness, sleep disturbances, and others.  Drug interactions: CBC may interact with other medications  such as blood-thinners. (Last update: 06/10/2020) ____________________________________________________________________________________________   ____________________________________________________________________________________________  Medication Rules  Purpose: To inform patients, and their family members, of our rules and regulations.  Applies to: All patients receiving prescriptions (written or electronic).  Pharmacy of record: Pharmacy where electronic prescriptions will be sent. If written prescriptions are taken to a different pharmacy, please inform  the nursing staff. The pharmacy listed in the electronic medical record should be the one where you would like electronic prescriptions to be sent.  Electronic prescriptions: In compliance with the [place] Strengthen Opioid Misuse Prevention (STOP) Act of 2017 (Session Law 2017-74/H243), effective November 04, 2018, all controlled substances must be electronically prescribed. Calling prescriptions to the pharmacy will cease to exist.  Prescription refills: Only during scheduled appointments. Applies to all prescriptions.  NOTE: The following applies primarily to controlled substances (Opioid* Pain Medications).   Type of encounter (visit): For patients receiving controlled substances, face-to-face visits are required. (Not an option or up to the patient.)  Patient's responsibilities: 1. Pain Pills: Bring all pain pills to every appointment (except for procedure appointments). 2. Pill Bottles: Bring pills in original pharmacy bottle. Always bring the newest bottle. Bring bottle, even if empty. 3. Medication refills: You are responsible for knowing and keeping track of what medications you take and those you need refilled. The day before your appointment: write a list of all prescriptions that need to be refilled. The day of the appointment: give the list to the admitting nurse. Prescriptions will be written only during  appointments. No prescriptions will be written on procedure days. If you forget a medication: it will not be "Called in", "Faxed", or "electronically sent". You will need to get another appointment to get these prescribed. No early refills. Do not call asking to have your prescription filled early. 4. Prescription Accuracy: You are responsible for carefully inspecting your prescriptions before leaving our office. Have the discharge nurse carefully go over each prescription with you, before taking them home. Make sure that your name is accurately spelled, that your address is correct. Check the name and dose of your medication to make sure it is accurate. Check the number of pills, and the written instructions to make sure they are clear and accurate. Make sure that you are given enough medication to last until your next medication refill appointment. 5. Taking Medication: Take medication as prescribed. When it comes to controlled substances, taking less pills or less frequently than prescribed is permitted and encouraged. Never take more pills than instructed. Never take medication more frequently than prescribed.  6. Inform other Doctors: Always inform, all of your healthcare providers, of all the medications you take. 7. Pain Medication from other Providers: You are not allowed to accept any additional pain medication from any other Doctor or Healthcare provider. There are two exceptions to this rule. (see below) In the event that you require additional pain medication, you are responsible for notifying us, as stated below. 8. Medication Agreement: You are responsible for carefully reading and following our Medication Agreement. This must be signed before receiving any prescriptions from our practice. Safely store a copy of your signed Agreement. Violations to the Agreement will result in no further prescriptions. (Additional copies of our Medication Agreement are available upon request.) 9. Laws, Rules,  & Regulations: All patients are expected to follow all Federal and State Laws, Statutes, Rules, & Regulations. Ignorance of the Laws does not constitute a valid excuse.  10. Illegal drugs and Controlled Substances: The use of illegal substances (including, but not limited to marijuana and its derivatives) and/or the illegal use of any controlled substances is strictly prohibited. Violation of this rule may result in the immediate and permanent discontinuation of any and all prescriptions being written by our practice. The use of any illegal substances is prohibited. 11. Adopted CDC guidelines & recommendations: Target dosing   levels will be at or below 60 MME/day. Use of benzodiazepines** is not recommended.  Exceptions: There are only two exceptions to the rule of not receiving pain medications from other Healthcare Providers. 1. Exception #1 (Emergencies): In the event of an emergency (i.e.: accident requiring emergency care), you are allowed to receive additional pain medication. However, you are responsible for: As soon as you are able, call our office [phone], at any time of the day or night, and leave a message stating your name, the date and nature of the emergency, and the name and dose of the medication prescribed. In the event that your call is answered by a member of our staff, make sure to document and save the date, time, and the name of the person that took your information.  2. Exception #2 (Planned Surgery): In the event that you are scheduled by another doctor or dentist to have any type of surgery or procedure, you are allowed (for a period no longer than 30 days), to receive additional pain medication, for the acute post-op pain. However, in this case, you are responsible for picking up a copy of our "Post-op Pain Management for Surgeons" handout, and giving it to your surgeon or dentist. This document is available at our office, and does not require an appointment to obtain it. Simply  go to our office during business hours (Monday-Thursday from 8:00 AM to 4:00 PM) (Friday 8:00 AM to 12:00 Noon) or if you have a scheduled appointment with us, prior to your surgery, and ask for it by name. In addition, you are responsible for: calling our office [phone], at any time of the day or night, and leaving a message stating your name, name of your surgeon, type of surgery, and date of procedure or surgery. Failure to comply with your responsibilities may result in termination of therapy involving the controlled substances.  *Opioid medications include: morphine, codeine, oxycodone, oxymorphone, hydrocodone, hydromorphone, meperidine, tramadol, tapentadol, buprenorphine, fentanyl, methadone. **Benzodiazepine medications include: diazepam (Valium), alprazolam (Xanax), clonazepam (Klonopine), lorazepam (Ativan), clorazepate (Tranxene), chlordiazepoxide (Librium), estazolam (Prosom), oxazepam (Serax), temazepam (Restoril), triazolam (Halcion) (Last updated: 07/11/2020) ____________________________________________________________________________________________   ____________________________________________________________________________________________  Medication Recommendations and Reminders  Applies to: All patients receiving prescriptions (written and/or electronic).  Medication Rules & Regulations: These rules and regulations exist for your safety and that of others. They are not flexible and neither are we. Dismissing or ignoring them will be considered "non-compliance" with medication therapy, resulting in complete and irreversible termination of such therapy. (See document titled "Medication Rules" for more details.) In all conscience, because of safety reasons, we cannot continue providing a therapy where the patient does not follow instructions.  Pharmacy of record:   Definition: This is the pharmacy where your electronic prescriptions will be sent.   We do not endorse any  particular pharmacy, however, we have experienced problems with [facility] not securing enough medication supply for the community.  We do not restrict you in your choice of pharmacy. However, once we write for your prescriptions, we will NOT be re-sending more prescriptions to fix restricted supply problems created by your pharmacy, or your insurance.   The pharmacy listed in the electronic medical record should be the one where you want electronic prescriptions to be sent.  If you choose to change pharmacy, simply notify our nursing staff.  Recommendations:  Keep all of your pain medications in a safe place, under lock and key, even if you live alone. We will NOT replace lost, stolen, or   damaged medication.  After you fill your prescription, take 1 week's worth of pills and put them away in a safe place. You should keep a separate, properly labeled bottle for this purpose. The remainder should be kept in the original bottle. Use this as your primary supply, until it runs out. Once it's gone, then you know that you have 1 week's worth of medicine, and it is time to come in for a prescription refill. If you do this correctly, it is unlikely that you will ever run out of medicine.  To make sure that the above recommendation works, it is very important that you make sure your medication refill appointments are scheduled at least 1 week before you run out of medicine. To do this in an effective manner, make sure that you do not leave the office without scheduling your next medication management appointment. Always ask the nursing staff to show you in your prescription , when your medication will be running out. Then arrange for the receptionist to get you a return appointment, at least 7 days before you run out of medicine. Do not wait until you have 1 or 2 pills left, to come in. This is very poor planning and does not take into consideration that we may need to cancel appointments due to bad weather,  sickness, or emergencies affecting our staff.  DO NOT ACCEPT A "Partial Fill": If for any reason your pharmacy does not have enough pills/tablets to completely fill or refill your prescription, do not allow for a "partial fill". The law allows the pharmacy to complete that prescription within 72 hours, without requiring a new prescription. If they do not fill the rest of your prescription within those 72 hours, you will need a separate prescription to fill the remaining amount, which we will NOT provide. If the reason for the partial fill is your insurance, you will need to talk to the pharmacist about payment alternatives for the remaining tablets, but again, DO NOT ACCEPT A PARTIAL FILL, unless you can trust your pharmacist to obtain the remainder of the pills within 72 hours.  Prescription refills and/or changes in medication(s):   Prescription refills, and/or changes in dose or medication, will be conducted only during scheduled medication management appointments. (Applies to both, written and electronic prescriptions.)  No refills on procedure days. No medication will be changed or started on procedure days. No changes, adjustments, and/or refills will be conducted on a procedure day. Doing so will interfere with the diagnostic portion of the procedure.  No phone refills. No medications will be "called into the pharmacy".  No Fax refills.  No weekend refills.  No Holliday refills.  No after hours refills.  Remember:  Business hours are:  Monday to Thursday 8:00 AM to 4:00 PM Provider's Schedule: Milinda Pointer, MD - Appointments are:  Medication management: Monday and Wednesday 8:00 AM to 4:00 PM Procedure day: Tuesday and Thursday 7:30 AM to 4:00 PM Gillis Santa, MD - Appointments are:  Medication management: Tuesday and Thursday 8:00 AM to 4:00 PM Procedure day: Monday and Wednesday 7:30 AM to 4:00 PM (Last update:  05/24/2020) ____________________________________________________________________________________________    ______________________________________________________________________________________________  Weight Management Required  URGENT: Your weight has been found to be adversely affecting your health.  Dear Ms. Leabo:  Your current Estimated body mass index is 40.35 kg/m as calculated from the following:   Height as of this encounter: $RemoveBeforeD'5\' 6"'[id number]$  (1.676 m).   Weight as of this encounter: 250 lb (113.4 kg).  Please use the table below to identify your weight category and associated incidence of chronic pain, secondary to your weight.  Body Mass Index (BMI) Classification BMI level (kg/m2) Category Associated incidence of chronic pain  <18  Underweight   18.5-24.9 Ideal body weight   25-29.9 Overweight  20%  30-34.9 Obese (Class I)  68%  35-39.9 Severe obesity (Class II)  136%  >40 Extreme obesity (Class III)  254%   In addition: You will be considered "Morbidly Obese", if your BMI is above 30 and you have one or more of the following conditions which are known to be caused and/or directly associated with obesity: 1.    Type 2 Diabetes (Which in turn can lead to cardiovascular diseases (CVD), stroke, peripheral vascular diseases (PVD), retinopathy, nephropathy, and neuropathy) 2.    Cardiovascular Disease (High Blood Pressure; Congestive Heart Failure; High Cholesterol; Coronary Artery Disease; Angina; or History of Heart Attacks) 3.    Breathing problems (Asthma; obesity-hypoventilation syndrome; obstructive sleep apnea; chronic inflammatory airway disease; reactive airway disease; or shortness of breath) 4.    Chronic kidney disease 5.    Liver disease (nonalcoholic fatty liver disease) 6.    High blood pressure 7.    Acid reflux (gastroesophageal reflux disease; heartburn) 8.    Osteoarthritis (OA) (with any of the following: hip pain; knee pain; and/or low back pain) 9.    Low  back pain (Lumbar Facet Syndrome; and/or Degenerative Disc Disease) 10.  Hip pain (Osteoarthritis of hip) (For every 1 lbs of added body weight, there is a 2 lbs increase in pressure inside of each hip articulation. 1:2 mechanical relationship) 11.  Knee pain (Osteoarthritis of knee) (For every 1 lbs of added body weight, there is a 4 lbs increase in pressure inside of each knee articulation. 1:4 mechanical relationship) (patients with a BMI>30 kg/m2 were 6.8 times more likely to develop knee OA than normal-weight individuals) 12.  Cancer: Epidemiological studies have shown that obesity is a risk factor for: post-menopausal breast cancer; cancers of the endometrium, colon and kidney cancer; malignant adenomas of the oesophagus. Obese subjects have an approximately 1.5-3.5-fold increased risk of developing these cancers compared with normal-weight subjects, and it has been estimated that between 15 and 45% of these cancers can be attributed to overweight. More recent studies suggest that obesity may also increase the risk of other types of cancer, including pancreatic, hepatic and gallbladder cancer. (Ref: Obesity and cancer. Pischon T, Nthlings U, [employer] H. Proc Nutr Soc. 2008 May;67(2):128-45. doi: 47.4259/D6387564332951884.) The International Agency for Research on Cancer (IARC) has identified 13 cancers associated with overweight and obesity: meningioma, multiple myeloma, adenocarcinoma of the esophagus, and cancers of the thyroid, postmenopausal breast cancer, gallbladder, stomach, liver, pancreas, kidney, ovaries, uterus, colon and rectal (colorectal) cancers. 23 percent of all cancers diagnosed in women and 24 percent of those diagnosed in men are associated with overweight and obesity.  Recommendation: At this point it is urgent that you take a step back and concentrate in loosing weight. Dedicate 100% of your efforts on this task. Nothing else will improve your health more than bringing your weight  down and your BMI to less than 30. If you are here, you probably have chronic pain. We know that most chronic pain patients have difficulty exercising secondary to their pain. For this reason, you must rely on proper nutrition and diet in order to lose the weight. If your BMI is above 40, you should seriously consider bariatric surgery. A realistic goal  is to lose 10% of your body weight over a period of 12 months.  Be honest to yourself, if over time you have unsuccessfully tried to lose weight, then it is time for you to seek professional help and to enter a medically supervised weight management program, and/or undergo bariatric surgery. Stop procrastinating.   Pain management considerations:  1.    Pharmacological Problems: Be advised that the use of opioid analgesics (oxycodone; hydrocodone; morphine; methadone; codeine; and all of their derivatives) have been associated with decreased metabolism and weight gain.  For this reason, should we see that you are unable to lose weight while taking these medications, it may become necessary for [place] to taper down and indefinitely discontinue them.  2.    Technical Problems: The incidence of successful interventional therapies decreases as the patient's BMI increases. It is much more difficult to accomplish a safe and effective interventional therapy on a patient with a BMI above 35. 3.    Radiation Exposure Problems: The x-rays machine, used to accomplish injection therapies, will automatically increase their x-ray output in order to capture an appropriate bone image. This means that radiation exposure increases exponentially with the patient's BMI. (The higher the BMI, the higher the radiation exposure.) Although the level of radiation used at a given time is still safe to the patient, it is not for the physician and/or assisting staff. Unfortunately, radiation exposure is accumulative. Because physicians and the staff have to do procedures and be exposed on a  daily basis, this can result in health problems such as cancer and radiation burns. Radiation exposure to the staff is monitored by the radiation batches that they wear. The exposure levels are reported back to the staff on a quarterly basis. Depending on levels of exposure, physicians and staff may be obligated by law to decrease this exposure. This means that they have the right and obligation to refuse providing therapies where they may be overexposed to radiation. For this reason, physicians may decline to offer therapies such as radiofrequency ablation or implants to patients with a BMI above 40. 4.    Current Trends: Be advised that the current trend is to no longer offer certain therapies to patients with a BMI equal to, or above 35, due to increase perioperative risks, increased technical procedural difficulties, and excessive radiation exposure to healthcare personnel.  ______________________________________________________________________________________________

## 2020-08-23 NOTE — Progress Notes (Signed)
Nursing Pain Medication Assessment:  Safety precautions to be maintained throughout the outpatient stay will include: orient to surroundings, keep bed in low position, maintain call bell within reach at all times, provide assistance with transfer out of bed and ambulation.  Medication Inspection Compliance: Pill count conducted under aseptic conditions, in front of the patient. Neither the pills nor the bottle was removed from the patient's sight at any time. Once count was completed pills were immediately returned to the patient in their original bottle.  Medication: Hydrocodone/APAP Pill/Patch Count: 42 of 90 pills remain Pill/Patch Appearance: Markings consistent with prescribed medication Bottle Appearance: Standard pharmacy container. Clearly labeled. Filled Date: 10 / 04 / 2021 Last Medication intake:  Yesterday

## 2020-11-27 NOTE — Progress Notes (Signed)
PROVIDER NOTE: Information contained herein reflects review and annotations entered in association with encounter. Interpretation of such information and data should be left to medically-trained personnel. Information provided to patient can be located elsewhere in the medical record under "Patient Instructions". Document created using STT-dictation technology, any transcriptional errors that may result from process are unintentional.    Patient: Gerrit Heck  Service Category: E/M  Provider: Gaspar Cola, MD  DOB: [birth year]  DOS: 11/29/2020  Specialty: Interventional Pain Management  MRN: [id number]  Setting: Ambulatory outpatient  PCP: Barbaraann Boys, MD  Type: Established Patient    Referring Provider: Barbaraann Boys, MD  Location: Office  Delivery: Face-to-face     HPI  Ms. Gerrit Heck, a 60 y.o. year old female, is here today because of her Chronic pain syndrome [G89.4]. Ms. Showman primary complain today is Knee Pain Last encounter: My last encounter with her was on 08/23/2020. Pertinent problems: Ms. Tibbett has Knee joint pain; Osteoarthritis; Common migraine; Myofascial pain; Muscle spasms of lower extremity; Chronic pain syndrome; Osteoarthritis of knee (Bilateral) (L>R); Chronic migraine; Chronic knee pain (1ry area of Pain) (Bilateral) (L>R); Migraine without aura; Chronic musculoskeletal pain; Arthropathy of knee (Right); and Tricompartment osteoarthritis of knee (Right) on their pertinent problem list. Pain Assessment: Severity of Chronic pain is reported as a 3 /10. Location: Knee Right,Left/sometimes to ankles and sometimes will radiate up thighs bilateral. Onset: More than a month ago. Quality: Aching,Discomfort,Sharp. Timing: Constant. Modifying factor(s): ice, meds, rest. Vitals:  height is 5' 6" (1.676 m) and weight is 245 lb (111.1 kg). Her temperature is 96.6 F (35.9 C) (abnormal). Her blood pressure is 160/74 (abnormal) and her pulse is 72. Her respiration is 16 and  oxygen saturation is 100%.   Reason for encounter: medication management.   The patient indicates doing well with the current medication regimen. No adverse reactions or side effects reported to the medications.  The patient indicates still having problems with both of her knees.  She indicates that she is having some instability.  She is pending bilateral total knee replacements from [facility] in [place].  She refers that her orthopedic surgeon is Dr. Cecilie Lowers.  Today I have offered the patient some interventional treatment for the knee pain, but she has turned all of them down.  She has been coming to our practice since 08/30/2015 and she has never taken advantage of any of the interventional therapies that we specialize in.  I have had several conversations with this patient regarding her weight but I made myself a note to the chart not to go there again since she gets very upset.  Her current weight is 245 pounds (111.1 kg), BMI 39.54 kg/m.  RTCB: 03/05/2021  Pharmacotherapy Assessment   Analgesic: Hydrocodone/APAP 7.5/325 one every 8 hours (22.5 mg/day of hydrocodone) MME/day:22.5 mg/day.   Monitoring: [place] PMP: PDMP reviewed during this encounter.       Pharmacotherapy: No side-effects or adverse reactions reported. Compliance: No problems identified. Effectiveness: Clinically acceptable.  [name], RN  11/29/2020  8:05 AM  Sign when Signing Visit Nursing Pain Medication Assessment:  Safety precautions to be maintained throughout the outpatient stay will include: orient to surroundings, keep bed in low position, maintain call bell within reach at all times, provide assistance with transfer out of bed and ambulation.  Medication Inspection Compliance: Pill count conducted under aseptic conditions, in front of the patient. Neither the pills nor the bottle was removed from the patient's sight at any time.  Once count was completed pills were immediately returned to the patient in  their original bottle.  Medication: hydrocodone  Pill/Patch Count: 21 of 90 pills remain Pill/Patch Appearance: Markings consistent with prescribed medication Bottle Appearance: Standard pharmacy container. Clearly labeled. Filled Date: 68 / 31 / 2021 Last Medication intake:  Yesterday    UDS:  Summary  Date Value Ref Range Status  05/03/2020 Note  Corrected    Comment:    ==================================================================== [employer] Select 13 (MW) ==================================================================== Test                             Result       Flag       Units  Drug Present and Declared for Prescription Verification   Oxazepam                       88           EXPECTED   ng/mg creat   Temazepam                      77           EXPECTED   ng/mg creat    Oxazepam and temazepam are expected metabolites of diazepam.    Oxazepam is also an expected metabolite of other benzodiazepine    drugs, including chlordiazepoxide, prazepam, clorazepate, halazepam,    and temazepam.  Oxazepam and temazepam are available as scheduled    prescription medications.    Hydrocodone                    1735         EXPECTED   ng/mg creat   Hydromorphone                  220          EXPECTED   ng/mg creat   Dihydrocodeine                 319          EXPECTED   ng/mg creat   Norhydrocodone                 2548         EXPECTED   ng/mg creat    Sources of hydrocodone include scheduled prescription medications.    Hydromorphone, dihydrocodeine and norhydrocodone are expected    metabolites of hydrocodone. Hydromorphone and dihydrocodeine are    also available as scheduled prescription medications.  ==================================================================== Test                      Result    Flag   Units      Ref Range   Creatinine              101              mg/dL      >=20 ==================================================================== Declared  Medications:  The flagging and interpretation on this report are based on the  following declared medications.  Unexpected results may arise from  inaccuracies in the declared medications.   **Note: The testing scope of this panel includes these medications:   Diazepam (Valium)  Hydrocodone (Norco)   **Note: The testing scope of this panel does not include the  following reported medications:   Acetaminophen (Norco)  Amlodipine (Norvasc)  Atenolol (Tenormin)  Calcium  Cholecalciferol  Hydrochlorothiazide (Hyzaar)  Iron  Losartan (Hyzaar)  Multivitamin  Nortriptyline (Pamelor)  Omeprazole (Prilosec)  Sertraline (Zoloft)  Sumatriptan (Imitrex)  Tizanidine (Zanaflex)  Vitamin B12 ==================================================================== For clinical consultation, please call [phone]. ====================================================================      ROS  Constitutional: Denies any fever or chills Gastrointestinal: No reported hemesis, hematochezia, vomiting, or acute GI distress Musculoskeletal: Denies any acute onset joint swelling, redness, loss of ROM, or weakness Neurological: No reported episodes of acute onset apraxia, aphasia, dysarthria, agnosia, amnesia, paralysis, loss of coordination, or loss of consciousness  Medication Review  HYDROcodone-acetaminophen, SUMAtriptan, Vitamin D, amLODipine, atenolol, calcium carbonate, diazepam, ferrous sulfate, losartan-hydrochlorothiazide, multivitamin with minerals, nortriptyline, omeprazole, sertraline, tiZANidine, and vitamin B-12  History Review  Allergy: Ms. Sanville is allergic to 2,4-d dimethylamine (amisol); diphenhydramine; ibuprofen; morphine and related; nsaids; tetanus toxoids; diclofenac; and buspirone. Drug: Ms. Summey  reports no history of drug use. Alcohol:  reports no history of alcohol use. Tobacco:  reports that she has never smoked. She has never used smokeless tobacco. Social: Ms.  Yerby  reports that she has never smoked. She has never used smokeless tobacco. She reports that she does not drink alcohol and does not use drugs. Medical:  has a past medical history of Acid reflux (10/27/2007), Arthritis (07/27/2007), Avitaminosis D (10/12/2012), Cataract, Depression, Gonalgia (05/30/2011), H/O cataract extraction (01/20/2015), History of migraine (08/30/2015), Hypertension, Migraines, Osteoarthritis, and Status post bariatric surgery (08/30/2015). Surgical: Ms. Dadisman  has a past surgical history that includes Abdominal hysterectomy; Bariatric Surgery; Wrist surgery; Knee surgery; and Tonsillectomy. Family: family history includes Cancer in her mother; Heart disease in her father.  Laboratory Chemistry Profile   Renal Lab Results  Component Value Date   BUN 15 05/13/2019   CREATININE 1.06 (H) 05/13/2019   BCR 14 05/13/2019   GFRAA 68 05/13/2019   GFRNONAA 59 (L) 05/13/2019     Hepatic Lab Results  Component Value Date   AST 25 05/13/2019   ALT 18 05/23/2016   ALBUMIN 4.5 05/13/2019   ALKPHOS 126 (H) 05/13/2019     Electrolytes Lab Results  Component Value Date   NA 139 05/13/2019   K 3.7 05/13/2019   CL 98 05/13/2019   CALCIUM 9.7 05/13/2019   MG 2.2 05/13/2019     Bone Lab Results  Component Value Date   25OHVITD1 30 05/13/2019   25OHVITD2 <1.0 05/13/2019   25OHVITD3 30 05/13/2019     Inflammation (CRP: Acute Phase) (ESR: Chronic Phase) Lab Results  Component Value Date   CRP 21 (H) 05/13/2019   ESRSEDRATE 54 (H) 05/13/2019       Note: Above Lab results reviewed.  Recent Imaging Review  DG Knee Complete 4 Views Right CLINICAL DATA:  Constant knee pain  EXAM: RIGHT KNEE - COMPLETE 4+ VIEW  COMPARISON:  12/01/2015  FINDINGS: Mild lateral deviation of the patella. Subarticular lucency at the lateral femoral condyle. Moderate arthritis of the lateral compartment. Mild arthritis of the medial compartment. Advanced arthritis of the  patellofemoral compartment. Small knee effusion. No acute displaced fracture  IMPRESSION: 1. No acute osseous abnormality. 2. Tricompartment arthritis with probable small knee effusion. 3. Subarticular lucency at the lateral femoral condyle, possibly a subarticular geode or small osteochondral lesion.  Electronically Signed   By: Donavan Foil M.D.   On: 12/27/2019 16:00 Note: Reviewed        Physical Exam  General appearance: Well nourished, well developed, and well hydrated. In no apparent acute distress Mental status: Alert, oriented x 3 (person,  place, & time)       Respiratory: No evidence of acute respiratory distress Eyes: PERLA Vitals: BP (!) 160/74   Pulse 72   Temp (!) 96.6 F (35.9 C)   Resp 16   Ht 5' 6" (1.676 m)   Wt 245 lb (111.1 kg)   SpO2 100%   BMI 39.54 kg/m  BMI: Estimated body mass index is 39.54 kg/m as calculated from the following:   Height as of this encounter: 5' 6" (1.676 m).   Weight as of this encounter: 245 lb (111.1 kg). Ideal: Ideal body weight: 59.3 kg (130 lb 11.7 oz) Adjusted ideal body weight: 80 kg (176 lb 7 oz)  Assessment   Status Diagnosis  Controlled Controlled Controlled 1. Chronic pain syndrome   2. Chronic knee pain (1ry area of Pain) (Bilateral) (L>R)   3. Osteoarthritis of knee (Bilateral) (L>R)   4. Tricompartment osteoarthritis of knee (Right)   5. Pharmacologic therapy   6. Uncomplicated opioid dependence ([place])      Updated Problems: No problems updated.  Plan of Care  Problem-specific:  No problem-specific Assessment & Plan notes found for this encounter.  Ms. JEROLENE KUPFER has a current medication list which includes the following long-term medication(s): amlodipine, atenolol, ferrous sulfate, [START ON 12/05/2020] hydrocodone-acetaminophen, [START ON 01/04/2021] hydrocodone-acetaminophen, [START ON 02/03/2021] hydrocodone-acetaminophen, losartan-hydrochlorothiazide, nortriptyline, omeprazole, sertraline, and  sumatriptan.  Pharmacotherapy (Medications Ordered): Meds ordered this encounter  Medications  . HYDROcodone-acetaminophen (NORCO) 7.5-325 MG tablet    Sig: Take 1 tablet by mouth every 8 (eight) hours as needed for severe pain. Must last 30 days    Dispense:  90 tablet    Refill:  0    Chronic Pain: STOP Act (Not applicable) Fill 1 day early if closed on refill date. Avoid benzodiazepines within 8 hours of opioids  . HYDROcodone-acetaminophen (NORCO) 7.5-325 MG tablet    Sig: Take 1 tablet by mouth every 8 (eight) hours as needed for severe pain. Must last 30 days    Dispense:  90 tablet    Refill:  0    Chronic Pain: STOP Act (Not applicable) Fill 1 day early if closed on refill date. Avoid benzodiazepines within 8 hours of opioids  . HYDROcodone-acetaminophen (NORCO) 7.5-325 MG tablet    Sig: Take 1 tablet by mouth every 8 (eight) hours as needed for severe pain. Must last 30 days    Dispense:  90 tablet    Refill:  0    Chronic Pain: STOP Act (Not applicable) Fill 1 day early if closed on refill date. Avoid benzodiazepines within 8 hours of opioids   Orders:  No orders of the defined types were placed in this encounter.  Follow-up plan:   Return in about 3 months (around 03/05/2021) for (F2F), (Med Mgmt).      Considering:   Diagnostic bilateral IA Hyalgan knee injections  Diagnostic bilateral IA steroid knee injections  Diagnostic bilateral genicular NB  Possible bilateral genicular RFA    Palliative PRN treatment(s):   None at this time     Recent Visits No visits were found meeting these conditions. Showing recent visits within past 90 days and meeting all other requirements Today's Visits Date Type Provider Dept  11/29/20 Office Visit Milinda Pointer, MD [facility]  Showing today's visits and meeting all other requirements Future Appointments No visits were found meeting these conditions. Showing future appointments within next 90 days and meeting all  other requirements  I discussed the assessment and  treatment plan with the patient. The patient was provided an opportunity to ask questions and all were answered. The patient agreed with the plan and demonstrated an understanding of the instructions.  Patient advised to call back or seek an in-person evaluation if the symptoms or condition worsens.  Duration of encounter: 30 minutes.  Note by: Gaspar Cola, MD Date: 11/29/2020; Time: 8:19 AM

## 2020-11-29 ENCOUNTER — Other Ambulatory Visit: Payer: Self-pay

## 2020-11-29 ENCOUNTER — Ambulatory Visit: Payer: BC Managed Care – PPO | Attending: Pain Medicine | Admitting: Pain Medicine

## 2020-11-29 ENCOUNTER — Encounter: Payer: Self-pay | Admitting: Pain Medicine

## 2020-11-29 VITALS — BP 160/74 | HR 72 | Temp 96.6°F | Resp 16 | Ht 66.0 in | Wt 245.0 lb

## 2020-11-29 DIAGNOSIS — M1711 Unilateral primary osteoarthritis, right knee: Secondary | ICD-10-CM | POA: Insufficient documentation

## 2020-11-29 DIAGNOSIS — M25561 Pain in right knee: Secondary | ICD-10-CM | POA: Insufficient documentation

## 2020-11-29 DIAGNOSIS — G894 Chronic pain syndrome: Secondary | ICD-10-CM | POA: Diagnosis not present

## 2020-11-29 DIAGNOSIS — M25562 Pain in left knee: Secondary | ICD-10-CM | POA: Diagnosis present

## 2020-11-29 DIAGNOSIS — F1120 Opioid dependence, uncomplicated: Secondary | ICD-10-CM | POA: Insufficient documentation

## 2020-11-29 DIAGNOSIS — Z79899 Other long term (current) drug therapy: Secondary | ICD-10-CM | POA: Insufficient documentation

## 2020-11-29 DIAGNOSIS — G8929 Other chronic pain: Secondary | ICD-10-CM | POA: Insufficient documentation

## 2020-11-29 DIAGNOSIS — M170 Bilateral primary osteoarthritis of knee: Secondary | ICD-10-CM | POA: Diagnosis present

## 2020-11-29 MED ORDER — HYDROCODONE-ACETAMINOPHEN 7.5-325 MG PO TABS: 1.0000 | ORAL_TABLET | Freq: Three times a day (TID) | ORAL | 0 refills | Status: DC | PRN

## 2020-11-29 NOTE — Patient Instructions (Signed)
____________________________________________________________________________________________  Medication Rules  Purpose: To inform patients, and their family members, of our rules and regulations.  Applies to: All patients receiving prescriptions (written or electronic).  Pharmacy of record: Pharmacy where electronic prescriptions will be sent. If written prescriptions are taken to a different pharmacy, please inform the nursing staff. The pharmacy listed in the electronic medical record should be the one where you would like electronic prescriptions to be sent.  Electronic prescriptions: In compliance with the [place] Strengthen Opioid Misuse Prevention (STOP) Act of 2017 (Session Law 2017-74/H243), effective November 04, 2018, all controlled substances must be electronically prescribed. Calling prescriptions to the pharmacy will cease to exist.  Prescription refills: Only during scheduled appointments. Applies to all prescriptions.  NOTE: The following applies primarily to controlled substances (Opioid* Pain Medications).   Type of encounter (visit): For patients receiving controlled substances, face-to-face visits are required. (Not an option or up to the patient.)  Patient's responsibilities: 1. Pain Pills: Bring all pain pills to every appointment (except for procedure appointments). 2. Pill Bottles: Bring pills in original pharmacy bottle. Always bring the newest bottle. Bring bottle, even if empty. 3. Medication refills: You are responsible for knowing and keeping track of what medications you take and those you need refilled. The day before your appointment: write a list of all prescriptions that need to be refilled. The day of the appointment: give the list to the admitting nurse. Prescriptions will be written only during appointments. No prescriptions will be written on procedure days. If you forget a medication: it will not be "Called in", "Faxed", or "electronically sent".  You will need to get another appointment to get these prescribed. No early refills. Do not call asking to have your prescription filled early. 4. Prescription Accuracy: You are responsible for carefully inspecting your prescriptions before leaving our office. Have the discharge nurse carefully go over each prescription with you, before taking them home. Make sure that your name is accurately spelled, that your address is correct. Check the name and dose of your medication to make sure it is accurate. Check the number of pills, and the written instructions to make sure they are clear and accurate. Make sure that you are given enough medication to last until your next medication refill appointment. 5. Taking Medication: Take medication as prescribed. When it comes to controlled substances, taking less pills or less frequently than prescribed is permitted and encouraged. Never take more pills than instructed. Never take medication more frequently than prescribed.  6. Inform other Doctors: Always inform, all of your healthcare providers, of all the medications you take. 7. Pain Medication from other Providers: You are not allowed to accept any additional pain medication from any other Doctor or Healthcare provider. There are two exceptions to this rule. (see below) In the event that you require additional pain medication, you are responsible for notifying us, as stated below. 8. Cough Medicine: Often these contain an opioid, such as codeine or hydrocodone. Never accept or take cough medicine containing these opioids if you are already taking an opioid* medication. The combination may cause respiratory failure and death. 9. Medication Agreement: You are responsible for carefully reading and following our Medication Agreement. This must be signed before receiving any prescriptions from our practice. Safely store a copy of your signed Agreement. Violations to the Agreement will result in no further prescriptions.  (Additional copies of our Medication Agreement are available upon request.) 10. Laws, Rules, & Regulations: All patients are expected to follow all   Federal and State Laws, Statutes, Rules, & Regulations. Ignorance of the Laws does not constitute a valid excuse.  11. Illegal drugs and Controlled Substances: The use of illegal substances (including, but not limited to marijuana and its derivatives) and/or the illegal use of any controlled substances is strictly prohibited. Violation of this rule may result in the immediate and permanent discontinuation of any and all prescriptions being written by our practice. The use of any illegal substances is prohibited. 12. Adopted CDC guidelines & recommendations: Target dosing levels will be at or below 60 MME/day. Use of benzodiazepines** is not recommended.  Exceptions: There are only two exceptions to the rule of not receiving pain medications from other Healthcare Providers. 1. Exception #1 (Emergencies): In the event of an emergency (i.e.: accident requiring emergency care), you are allowed to receive additional pain medication. However, you are responsible for: As soon as you are able, call our office [phone], at any time of the day or night, and leave a message stating your name, the date and nature of the emergency, and the name and dose of the medication prescribed. In the event that your call is answered by a member of our staff, make sure to document and save the date, time, and the name of the person that took your information.  2. Exception #2 (Planned Surgery): In the event that you are scheduled by another doctor or dentist to have any type of surgery or procedure, you are allowed (for a period no longer than 30 days), to receive additional pain medication, for the acute post-op pain. However, in this case, you are responsible for picking up a copy of our "Post-op Pain Management for Surgeons" handout, and giving it to your surgeon or dentist. This  document is available at our office, and does not require an appointment to obtain it. Simply go to our office during business hours (Monday-Thursday from 8:00 AM to 4:00 PM) (Friday 8:00 AM to 12:00 Noon) or if you have a scheduled appointment with us, prior to your surgery, and ask for it by name. In addition, you are responsible for: calling our office [phone], at any time of the day or night, and leaving a message stating your name, name of your surgeon, type of surgery, and date of procedure or surgery. Failure to comply with your responsibilities may result in termination of therapy involving the controlled substances.  *Opioid medications include: morphine, codeine, oxycodone, oxymorphone, hydrocodone, hydromorphone, meperidine, tramadol, tapentadol, buprenorphine, fentanyl, methadone. **Benzodiazepine medications include: diazepam (Valium), alprazolam (Xanax), clonazepam (Klonopine), lorazepam (Ativan), clorazepate (Tranxene), chlordiazepoxide (Librium), estazolam (Prosom), oxazepam (Serax), temazepam (Restoril), triazolam (Halcion) (Last updated: 10/02/2020) ____________________________________________________________________________________________   ____________________________________________________________________________________________  Medication Recommendations and Reminders  Applies to: All patients receiving prescriptions (written and/or electronic).  Medication Rules & Regulations: These rules and regulations exist for your safety and that of others. They are not flexible and neither are we. Dismissing or ignoring them will be considered "non-compliance" with medication therapy, resulting in complete and irreversible termination of such therapy. (See document titled "Medication Rules" for more details.) In all conscience, because of safety reasons, we cannot continue providing a therapy where the patient does not follow instructions.  Pharmacy of record:   Definition:  This is the pharmacy where your electronic prescriptions will be sent.   We do not endorse any particular pharmacy, however, we have experienced problems with [facility] not securing enough medication supply for the community.  We do not restrict you in your choice of pharmacy. However,   once we write for your prescriptions, we will NOT be re-sending more prescriptions to fix restricted supply problems created by your pharmacy, or your insurance.   The pharmacy listed in the electronic medical record should be the one where you want electronic prescriptions to be sent.  If you choose to change pharmacy, simply notify our nursing staff.  Recommendations:  Keep all of your pain medications in a safe place, under lock and key, even if you live alone. We will NOT replace lost, stolen, or damaged medication.  After you fill your prescription, take 1 week's worth of pills and put them away in a safe place. You should keep a separate, properly labeled bottle for this purpose. The remainder should be kept in the original bottle. Use this as your primary supply, until it runs out. Once it's gone, then you know that you have 1 week's worth of medicine, and it is time to come in for a prescription refill. If you do this correctly, it is unlikely that you will ever run out of medicine.  To make sure that the above recommendation works, it is very important that you make sure your medication refill appointments are scheduled at least 1 week before you run out of medicine. To do this in an effective manner, make sure that you do not leave the office without scheduling your next medication management appointment. Always ask the nursing staff to show you in your prescription , when your medication will be running out. Then arrange for the receptionist to get you a return appointment, at least 7 days before you run out of medicine. Do not wait until you have 1 or 2 pills left, to come in. This is very poor planning and  does not take into consideration that we may need to cancel appointments due to bad weather, sickness, or emergencies affecting our staff.  DO NOT ACCEPT A "Partial Fill": If for any reason your pharmacy does not have enough pills/tablets to completely fill or refill your prescription, do not allow for a "partial fill". The law allows the pharmacy to complete that prescription within 72 hours, without requiring a new prescription. If they do not fill the rest of your prescription within those 72 hours, you will need a separate prescription to fill the remaining amount, which we will NOT provide. If the reason for the partial fill is your insurance, you will need to talk to the pharmacist about payment alternatives for the remaining tablets, but again, DO NOT ACCEPT A PARTIAL FILL, unless you can trust your pharmacist to obtain the remainder of the pills within 72 hours.  Prescription refills and/or changes in medication(s):   Prescription refills, and/or changes in dose or medication, will be conducted only during scheduled medication management appointments. (Applies to both, written and electronic prescriptions.)  No refills on procedure days. No medication will be changed or started on procedure days. No changes, adjustments, and/or refills will be conducted on a procedure day. Doing so will interfere with the diagnostic portion of the procedure.  No phone refills. No medications will be "called into the pharmacy".  No Fax refills.  No weekend refills.  No Holliday refills.  No after hours refills.  Remember:  Business hours are:  Monday to Thursday 8:00 AM to 4:00 PM Provider's Schedule: Posey Petrik, MD - Appointments are:  Medication management: Monday and Wednesday 8:00 AM to 4:00 PM Procedure day: Tuesday and Thursday 7:30 AM to 4:00 PM Bilal Lateef, MD - Appointments are:    Medication management: Tuesday and Thursday 8:00 AM to 4:00 PM Procedure day: Monday and Wednesday  7:30 AM to 4:00 PM (Last update: 05/24/2020) ____________________________________________________________________________________________   ____________________________________________________________________________________________  CBD (cannabidiol) WARNING  Applicable to: All individuals currently taking or considering taking CBD (cannabidiol) and, more important, all patients taking opioid analgesic controlled substances (pain medication). (Example: oxycodone; oxymorphone; hydrocodone; hydromorphone; morphine; methadone; tramadol; tapentadol; fentanyl; buprenorphine; butorphanol; dextromethorphan; meperidine; codeine; etc.)  Legal status: CBD remains a Schedule I drug prohibited for any use. CBD is illegal with one exception. In the [place], CBD has a limited Food and Drug Administration (FDA) approval for the treatment of two specific types of epilepsy disorders. Only one CBD product has been approved by the FDA for this purpose: "Epidiolex". FDA is aware that some companies are marketing products containing cannabis and cannabis-derived compounds in ways that violate the Federal Food, Drug and Cosmetic Act (FD&C Act) and that may put the health and safety of consumers at risk. The FDA, a Federal agency, has not enforced the CBD status since 2018.   Legality: Some manufacturers ship CBD products nationally, which is illegal. Often such products are sold online and are therefore available throughout the country. CBD is openly sold in head shops and health food stores in some states where such sales have not been explicitly legalized. Selling unapproved products with unsubstantiated therapeutic claims is not only a violation of the law, but also can put patients at risk, as these products have not been proven to be safe or effective. Federal illegality makes it difficult to conduct research on CBD.  Reference: "FDA Regulation of Cannabis and Cannabis-Derived Products, Including Cannabidiol  (CBD)" - https://www.fda.gov/news-events/public-health-focus/fda-regulation-cannabis-and-cannabis-derived-products-including-cannabidiol-cbd  Warning: CBD is not FDA approved and has not undergo the same manufacturing controls as prescription drugs.  This means that the purity and safety of available CBD may be questionable. Most of the time, despite manufacturer's claims, it is contaminated with THC (delta-9-tetrahydrocannabinol - the chemical in marijuana responsible for the "HIGH").  When this is the case, the THC contaminant will trigger a positive urine drug screen (UDS) test for Marijuana (carboxy-THC). Because a positive UDS for any illicit substance is a violation of our medication agreement, your opioid analgesics (pain medicine) may be permanently discontinued.  MORE ABOUT CBD  General Information: CBD  is a derivative of the Marijuana (cannabis sativa) plant discovered in 1940. It is one of the 113 identified substances found in Marijuana. It accounts for up to 40% of the plant's extract. As of 2018, preliminary clinical studies on CBD included research for the treatment of anxiety, movement disorders, and pain. CBD is available and consumed in multiple forms, including inhalation of smoke or vapor, as an aerosol spray, and by mouth. It may be supplied as an oil containing CBD, capsules, dried cannabis, or as a liquid solution. CBD is thought not to be as psychoactive as THC (delta-9-tetrahydrocannabinol - the chemical in marijuana responsible for the "HIGH"). Studies suggest that CBD may interact with different biological target receptors in the body, including cannabinoid and other neurotransmitter receptors. As of 2018 the mechanism of action for its biological effects has not been determined.  Side-effects  Adverse reactions: Dry mouth, diarrhea, decreased appetite, fatigue, drowsiness, malaise, weakness, sleep disturbances, and others.  Drug interactions: CBC may interact with other  medications such as blood-thinners. (Last update: 06/10/2020) ____________________________________________________________________________________________   ____________________________________________________________________________________________  Drug Holidays (Slow)  What is a "Drug Holiday"? Drug Holiday: is the name given to the period of time during   which a patient stops taking a medication(s) for the purpose of eliminating tolerance to the drug.  Benefits . Improved effectiveness of opioids. . Decreased opioid dose needed to achieve benefits. . Improved pain with lesser dose.  What is tolerance? Tolerance: is the progressive decreased in effectiveness of a drug due to its repetitive use. With repetitive use, the body gets use to the medication and as a consequence, it loses its effectiveness. This is a common problem seen with opioid pain medications. As a result, a larger dose of the drug is needed to achieve the same effect that used to be obtained with a smaller dose.  How long should a "Drug Holiday" last? You should stay off of the pain medicine for at least 14 consecutive days. (2 weeks)  Should I stop the medicine "cold turkey"? No. You should always coordinate with your Pain Specialist so that he/she can provide you with the correct medication dose to make the transition as smoothly as possible.  How do I stop the medicine? Slowly. You will be instructed to decrease the daily amount of pills that you take by one (1) pill every seven (7) days. This is called a "slow downward taper" of your dose. For example: if you normally take four (4) pills per day, you will be asked to drop this dose to three (3) pills per day for seven (7) days, then to two (2) pills per day for seven (7) days, then to one (1) per day for seven (7) days, and at the end of those last seven (7) days, this is when the "Drug Holiday" would start.   Will I have withdrawals? By doing a "slow downward  taper" like this one, it is unlikely that you will experience any significant withdrawal symptoms. Typically, what triggers withdrawals is the sudden stop of a high dose opioid therapy. Withdrawals can usually be avoided by slowly decreasing the dose over a prolonged period of time. If you do not follow these instructions and decide to stop your medication abruptly, withdrawals may be possible.  What are withdrawals? Withdrawals: refers to the wide range of symptoms that occur after stopping or dramatically reducing opiate drugs after heavy and prolonged use. Withdrawal symptoms do not occur to patients that use low dose opioids, or those who take the medication sporadically. Contrary to benzodiazepine (example: Valium, Xanax, etc.) or alcohol withdrawals ("Delirium Tremens"), opioid withdrawals are not lethal. Withdrawals are the physical manifestation of the body getting rid of the excess receptors.  Expected Symptoms Early symptoms of withdrawal may include: . Agitation . Anxiety . Muscle aches . Increased tearing . Insomnia . Runny nose . Sweating . Yawning  Late symptoms of withdrawal may include: . Abdominal cramping . Diarrhea . Dilated pupils . Goose bumps . Nausea . Vomiting  Will I experience withdrawals? Due to the slow nature of the taper, it is very unlikely that you will experience any.  What is a slow taper? Taper: refers to the gradual decrease in dose.  (Last update: 05/24/2020) ____________________________________________________________________________________________     

## 2020-11-29 NOTE — Progress Notes (Signed)
Nursing Pain Medication Assessment:  Safety precautions to be maintained throughout the outpatient stay will include: orient to surroundings, keep bed in low position, maintain call bell within reach at all times, provide assistance with transfer out of bed and ambulation.  Medication Inspection Compliance: Pill count conducted under aseptic conditions, in front of the patient. Neither the pills nor the bottle was removed from the patient's sight at any time. Once count was completed pills were immediately returned to the patient in their original bottle.  Medication: hydrocodone  Pill/Patch Count: 21 of 90 pills remain Pill/Patch Appearance: Markings consistent with prescribed medication Bottle Appearance: Standard pharmacy container. Clearly labeled. Filled Date: 28 / 31 / 2021 Last Medication intake:  Yesterday

## 2021-02-27 NOTE — Progress Notes (Signed)
PROVIDER NOTE: Information contained herein reflects review and annotations entered in association with encounter. Interpretation of such information and data should be left to medically-trained personnel. Information provided to patient can be located elsewhere in the medical record under "Patient Instructions". Document created using STT-dictation technology, any transcriptional errors that may result from process are unintentional.    Patient: Sophia Jackson  Service Category: E/M  Provider: Gaspar Cola, MD  DOB: [birth year]  DOS: 02/28/2021  Specialty: Interventional Pain Management  MRN: [id number]  Setting: Ambulatory outpatient  PCP: Barbaraann Boys, MD  Type: Established Patient    Referring Provider: Barbaraann Boys, MD  Location: Office  Delivery: Face-to-face     HPI  Sophia Jackson, a 60 y.o. year old female, is here today because of her Chronic pain syndrome [G89.4]. Sophia Jackson primary complain today is Knee Pain Last encounter: My last encounter with her was on 11/29/2020. Pertinent problems: Sophia Jackson has Knee joint pain; Osteoarthritis; Common migraine; Myofascial pain; Muscle spasms of lower extremity; Chronic pain syndrome; Osteoarthritis of knee (Bilateral) (L>R); Chronic migraine; Chronic knee pain (1ry area of Pain) (Bilateral) (L>R); Migraine without aura; Chronic musculoskeletal pain; Arthropathy of knee (Right); and Tricompartment osteoarthritis of knee (Right) on their pertinent problem list. Pain Assessment: Severity of Chronic pain is reported as a 4 /10. Location: Knee Right,Left/radiates up and down legs. Onset: More than a month ago. Quality: Aching,Sharp,Discomfort,Constant. Timing: Constant. Modifying factor(s): rest, ice, meds. Vitals:  height is '5\' 6"'  (1.676 m) and weight is 240 lb (108.9 kg). Her temperature is 97.3 F (36.3 C) (abnormal). Her blood pressure is 141/93 (abnormal) and her pulse is 81. Her respiration is 16 and oxygen saturation is 100%.    Reason for encounter: medication management.   The patient indicates doing well with the current medication regimen. No adverse reactions or side effects reported to the medications.   Today the patient has informed us that she is pending to have total knee replacements, possibly starting with the right side and going on to the left by Dr. Hoy Register from [facility] in [place].  I asked the patient if the surgery would be taking place within the next 3 months so that I could provide her with the postsurgical pain management guidelines for the patient, but she indicated that it was likely to take place at the end of the year since there is somebody at work that she needs to train first.  Today I have reminded the patient that when the time comes, we will be more than glad to provide her with a handout for the surgeons on how to manage the postop pain.  I have explained to her that the purpose of this is to avoid having the surgeons manipulate the chronic pain medications, which may lead to running out of medicine early and requiring early refills, which we will not be providing.  Today the patient also requested that I complete her handicap parking form so that she can get a sticker.  However, I have explained to her that my policy is to provide those only 2 patients with paraplegia or quadriplegia that are wheelchair-bound.  The patient argued that she has osteoarthritis and that it is convenient for her to be able to park her vehicle closer to where she works.  I explained to the patient that I understood, but that I would not be bending our rules.  She was clearly not very happy with my response.  RTCB: 06/03/2021  Pharmacotherapy Assessment  Analgesic: Hydrocodone/APAP 7.5/325 one every 8 hours (22.5 mg/day of hydrocodone) MME/day:22.5 mg/day.   Monitoring: [place] PMP: PDMP reviewed during this encounter.       Pharmacotherapy: No side-effects or adverse reactions  reported. Compliance: No problems identified. Effectiveness: Clinically acceptable.  [name], RN  02/28/2021  8:07 AM  Sign when Signing Visit Nursing Pain Medication Assessment:  Safety precautions to be maintained throughout the outpatient stay will include: orient to surroundings, keep bed in low position, maintain call bell within reach at all times, provide assistance with transfer out of bed and ambulation.  Medication Inspection Compliance: Pill count conducted under aseptic conditions, in front of the patient. Neither the pills nor the bottle was removed from the patient's sight at any time. Once count was completed pills were immediately returned to the patient in their original bottle.  Medication: Hydrocodone/APAP Pill/Patch Count: 14 of 90 pills remain Pill/Patch Appearance: Markings consistent with prescribed medication Bottle Appearance: Standard pharmacy container. Clearly labeled. Filled Date: 4 / 2 / 2022 Last Medication intake:  Yesterday    UDS:  Summary  Date Value Ref Range Status  05/03/2020 Note  Corrected    Comment:    ==================================================================== [employer] Select 13 (MW) ==================================================================== Test                             Result       Flag       Units  Drug Present and Declared for Prescription Verification   Oxazepam                       88           EXPECTED   ng/mg creat   Temazepam                      77           EXPECTED   ng/mg creat    Oxazepam and temazepam are expected metabolites of diazepam.    Oxazepam is also an expected metabolite of other benzodiazepine    drugs, including chlordiazepoxide, prazepam, clorazepate, halazepam,    and temazepam.  Oxazepam and temazepam are available as scheduled    prescription medications.    Hydrocodone                    1735         EXPECTED   ng/mg creat   Hydromorphone                  220           EXPECTED   ng/mg creat   Dihydrocodeine                 319          EXPECTED   ng/mg creat   Norhydrocodone                 2548         EXPECTED   ng/mg creat    Sources of hydrocodone include scheduled prescription medications.    Hydromorphone, dihydrocodeine and norhydrocodone are expected    metabolites of hydrocodone. Hydromorphone and dihydrocodeine are    also available as scheduled prescription medications.  ==================================================================== Test                      Result  Flag   Units      Ref Range   Creatinine              101              mg/dL      >=20 ==================================================================== Declared Medications:  The flagging and interpretation on this report are based on the  following declared medications.  Unexpected results may arise from  inaccuracies in the declared medications.   **Note: The testing scope of this panel includes these medications:   Diazepam (Valium)  Hydrocodone (Norco)   **Note: The testing scope of this panel does not include the  following reported medications:   Acetaminophen (Norco)  Amlodipine (Norvasc)  Atenolol (Tenormin)  Calcium  Cholecalciferol  Hydrochlorothiazide (Hyzaar)  Iron  Losartan (Hyzaar)  Multivitamin  Nortriptyline (Pamelor)  Omeprazole (Prilosec)  Sertraline (Zoloft)  Sumatriptan (Imitrex)  Tizanidine (Zanaflex)  Vitamin B12 ==================================================================== For clinical consultation, please call [phone]. ====================================================================      ROS  Constitutional: Denies any fever or chills Gastrointestinal: No reported hemesis, hematochezia, vomiting, or acute GI distress Musculoskeletal: Denies any acute onset joint swelling, redness, loss of ROM, or weakness Neurological: No reported episodes of acute onset apraxia, aphasia, dysarthria, agnosia, amnesia,  paralysis, loss of coordination, or loss of consciousness  Medication Review  HYDROcodone-acetaminophen, SUMAtriptan, Vitamin D, amLODipine, atenolol, calcium carbonate, diazepam, ferrous sulfate, losartan-hydrochlorothiazide, multivitamin with minerals, nortriptyline, omeprazole, sertraline, tiZANidine, and vitamin B-12  History Review  Allergy: Sophia Jackson is allergic to 2,4-d dimethylamine (amisol); diphenhydramine; ibuprofen; morphine and related; nsaids; tetanus toxoids; diclofenac; and buspirone. Drug: Sophia Jackson  reports no history of drug use. Alcohol:  reports no history of alcohol use. Tobacco:  reports that she has never smoked. She has never used smokeless tobacco. Social: Sophia Jackson  reports that she has never smoked. She has never used smokeless tobacco. She reports that she does not drink alcohol and does not use drugs. Medical:  has a past medical history of Acid reflux (10/27/2007), Arthritis (07/27/2007), Avitaminosis D (10/12/2012), Cataract, Depression, Gonalgia (05/30/2011), H/O cataract extraction (01/20/2015), History of migraine (08/30/2015), Hypertension, Migraines, Osteoarthritis, and Status post bariatric surgery (08/30/2015). Surgical: Sophia Jackson  has a past surgical history that includes Abdominal hysterectomy; Bariatric Surgery; Wrist surgery; Knee surgery; and Tonsillectomy. Family: family history includes Cancer in her mother; Heart disease in her father.  Laboratory Chemistry Profile   Renal Lab Results  Component Value Date   BUN 15 05/13/2019   CREATININE 1.06 (H) 05/13/2019   BCR 14 05/13/2019   GFRAA 68 05/13/2019   GFRNONAA 59 (L) 05/13/2019     Hepatic Lab Results  Component Value Date   AST 25 05/13/2019   ALT 18 05/23/2016   ALBUMIN 4.5 05/13/2019   ALKPHOS 126 (H) 05/13/2019     Electrolytes Lab Results  Component Value Date   NA 139 05/13/2019   K 3.7 05/13/2019   CL 98 05/13/2019   CALCIUM 9.7 05/13/2019   MG 2.2 05/13/2019      Bone Lab Results  Component Value Date   25OHVITD1 30 05/13/2019   25OHVITD2 <1.0 05/13/2019   25OHVITD3 30 05/13/2019     Inflammation (CRP: Acute Phase) (ESR: Chronic Phase) Lab Results  Component Value Date   CRP 21 (H) 05/13/2019   ESRSEDRATE 54 (H) 05/13/2019       Note: Above Lab results reviewed.  Recent Imaging Review  DG Knee Complete 4 Views Right CLINICAL DATA:  Constant knee pain  EXAM: RIGHT  KNEE - COMPLETE 4+ VIEW  COMPARISON:  12/01/2015  FINDINGS: Mild lateral deviation of the patella. Subarticular lucency at the lateral femoral condyle. Moderate arthritis of the lateral compartment. Mild arthritis of the medial compartment. Advanced arthritis of the patellofemoral compartment. Small knee effusion. No acute displaced fracture  IMPRESSION: 1. No acute osseous abnormality. 2. Tricompartment arthritis with probable small knee effusion. 3. Subarticular lucency at the lateral femoral condyle, possibly a subarticular geode or small osteochondral lesion.  Electronically Signed   By: Donavan Foil M.D.   On: 12/27/2019 16:00 Note: Reviewed        Physical Exam  General appearance: Well nourished, well developed, and well hydrated. In no apparent acute distress Mental status: Alert, oriented x 3 (person, place, & time)       Respiratory: No evidence of acute respiratory distress Eyes: PERLA Vitals: BP (!) 141/93   Pulse 81   Temp (!) 97.3 F (36.3 C)   Resp 16   Ht '5\' 6"'  (1.676 m)   Wt 240 lb (108.9 kg)   SpO2 100%   BMI 38.74 kg/m  BMI: Estimated body mass index is 38.74 kg/m as calculated from the following:   Height as of this encounter: '5\' 6"'  (1.676 m).   Weight as of this encounter: 240 lb (108.9 kg). Ideal: Ideal body weight: 59.3 kg (130 lb 11.7 oz) Adjusted ideal body weight: 79.1 kg (174 lb 7 oz)  Assessment   Status Diagnosis  Controlled Controlled Controlled 1. Chronic pain syndrome   2. Chronic knee pain (1ry area of Pain)  (Bilateral) (L>R)   3. Osteoarthritis of knee (Bilateral) (L>R)   4. Tricompartment osteoarthritis of knee (Right)   5. Pharmacologic therapy      Updated Problems: No problems updated.  Plan of Care  Problem-specific:  No problem-specific Assessment & Plan notes found for this encounter.  Sophia Jackson has a current medication list which includes the following long-term medication(s): amlodipine, atenolol, ferrous sulfate, [START ON 03/05/2021] hydrocodone-acetaminophen, [START ON 04/04/2021] hydrocodone-acetaminophen, [START ON 05/04/2021] hydrocodone-acetaminophen, losartan-hydrochlorothiazide, nortriptyline, omeprazole, sertraline, and sumatriptan.  Pharmacotherapy (Medications Ordered): Meds ordered this encounter  Medications  . HYDROcodone-acetaminophen (NORCO) 7.5-325 MG tablet    Sig: Take 1 tablet by mouth every 8 (eight) hours as needed for severe pain. Must last 30 days    Dispense:  90 tablet    Refill:  0    Not a duplicate. Do NOT delete! Dispense 1 day early if closed on refill date. Avoid benzodiazepines within 8 hours of opioids. Do not send refill requests.  Marland Kitchen HYDROcodone-acetaminophen (NORCO) 7.5-325 MG tablet    Sig: Take 1 tablet by mouth every 8 (eight) hours as needed for severe pain. Must last 30 days    Dispense:  90 tablet    Refill:  0    Not a duplicate. Do NOT delete! Dispense 1 day early if closed on refill date. Avoid benzodiazepines within 8 hours of opioids. Do not send refill requests.  Marland Kitchen HYDROcodone-acetaminophen (NORCO) 7.5-325 MG tablet    Sig: Take 1 tablet by mouth every 8 (eight) hours as needed for severe pain. Must last 30 days    Dispense:  90 tablet    Refill:  0    Not a duplicate. Do NOT delete! Dispense 1 day early if closed on refill date. Avoid benzodiazepines within 8 hours of opioids. Do not send refill requests.   Orders:  No orders of the defined types were placed in this encounter.  Follow-up plan:   Return in about 3 months  (around 06/03/2021) for (F2F), (MM).      Interventional Therapies  Risk  Complexity Considerations:   Estimated body mass index is 38.74 kg/m as calculated from the following:   Height as of this encounter: '5\' 6"'  (1.676 m).   Weight as of this encounter: 240 lb (108.9 kg). WNL   Planned  Pending:   Pending further evaluation   Considered and offered:   Diagnostic bilateral IA (Hyalgan) knee injections  Diagnostic bilateral IA (steroid) knee injections  Diagnostic bilateral genicular NB  Possible bilateral genicular RFA    Completed:   None since entering practice on 08/30/2015.   Therapeutic  Palliative (PRN) options:   None established    Recent Visits No visits were found meeting these conditions. Showing recent visits within past 90 days and meeting all other requirements Today's Visits Date Type Provider Dept  02/28/21 Office Visit Milinda Pointer, MD [facility]  Showing today's visits and meeting all other requirements Future Appointments No visits were found meeting these conditions. Showing future appointments within next 90 days and meeting all other requirements  I discussed the assessment and treatment plan with the patient. The patient was provided an opportunity to ask questions and all were answered. The patient agreed with the plan and demonstrated an understanding of the instructions.  Patient advised to call back or seek an in-person evaluation if the symptoms or condition worsens.  Duration of encounter: 30 minutes.  Note by: Gaspar Cola, MD Date: 02/28/2021; Time: 8:29 AM

## 2021-02-28 ENCOUNTER — Other Ambulatory Visit: Payer: Self-pay

## 2021-02-28 ENCOUNTER — Encounter: Payer: Self-pay | Admitting: Pain Medicine

## 2021-02-28 ENCOUNTER — Ambulatory Visit: Payer: BC Managed Care – PPO | Attending: Pain Medicine | Admitting: Pain Medicine

## 2021-02-28 VITALS — BP 141/93 | HR 81 | Temp 97.3°F | Resp 16 | Ht 66.0 in | Wt 240.0 lb

## 2021-02-28 DIAGNOSIS — M25561 Pain in right knee: Secondary | ICD-10-CM | POA: Diagnosis not present

## 2021-02-28 DIAGNOSIS — G8929 Other chronic pain: Secondary | ICD-10-CM | POA: Diagnosis present

## 2021-02-28 DIAGNOSIS — M170 Bilateral primary osteoarthritis of knee: Secondary | ICD-10-CM | POA: Diagnosis not present

## 2021-02-28 DIAGNOSIS — Z79899 Other long term (current) drug therapy: Secondary | ICD-10-CM | POA: Diagnosis present

## 2021-02-28 DIAGNOSIS — M1711 Unilateral primary osteoarthritis, right knee: Secondary | ICD-10-CM

## 2021-02-28 DIAGNOSIS — G894 Chronic pain syndrome: Secondary | ICD-10-CM | POA: Diagnosis not present

## 2021-02-28 DIAGNOSIS — M25562 Pain in left knee: Secondary | ICD-10-CM

## 2021-02-28 MED ORDER — HYDROCODONE-ACETAMINOPHEN 7.5-325 MG PO TABS: 1.0000 | ORAL_TABLET | Freq: Three times a day (TID) | ORAL | 0 refills | Status: DC | PRN

## 2021-02-28 NOTE — Progress Notes (Signed)
Nursing Pain Medication Assessment:  Safety precautions to be maintained throughout the outpatient stay will include: orient to surroundings, keep bed in low position, maintain call bell within reach at all times, provide assistance with transfer out of bed and ambulation.  Medication Inspection Compliance: Pill count conducted under aseptic conditions, in front of the patient. Neither the pills nor the bottle was removed from the patient's sight at any time. Once count was completed pills were immediately returned to the patient in their original bottle.  Medication: Hydrocodone/APAP Pill/Patch Count: 14 of 90 pills remain Pill/Patch Appearance: Markings consistent with prescribed medication Bottle Appearance: Standard pharmacy container. Clearly labeled. Filled Date: 4 / 2 / 2022 Last Medication intake:  Yesterday

## 2021-02-28 NOTE — Patient Instructions (Signed)
____________________________________________________________________________________________  Medication Recommendations and Reminders  Applies to: All patients receiving prescriptions (written and/or electronic).  Medication Rules & Regulations: These rules and regulations exist for your safety and that of others. They are not flexible and neither are we. Dismissing or ignoring them will be considered "non-compliance" with medication therapy, resulting in complete and irreversible termination of such therapy. (See document titled "Medication Rules" for more details.) In all conscience, because of safety reasons, we cannot continue providing a therapy where the patient does not follow instructions.  Pharmacy of record:   Definition: This is the pharmacy where your electronic prescriptions will be sent.   We do not endorse any particular pharmacy, however, we have experienced problems with [facility] not securing enough medication supply for the community.  We do not restrict you in your choice of pharmacy. However, once we write for your prescriptions, we will NOT be re-sending more prescriptions to fix restricted supply problems created by your pharmacy, or your insurance.   The pharmacy listed in the electronic medical record should be the one where you want electronic prescriptions to be sent.  If you choose to change pharmacy, simply notify our nursing staff.  Recommendations:  Keep all of your pain medications in a safe place, under lock and key, even if you live alone. We will NOT replace lost, stolen, or damaged medication.  After you fill your prescription, take 1 week's worth of pills and put them away in a safe place. You should keep a separate, properly labeled bottle for this purpose. The remainder should be kept in the original bottle. Use this as your primary supply, until it runs out. Once it's gone, then you know that you have 1 week's worth of medicine, and it is time to come  in for a prescription refill. If you do this correctly, it is unlikely that you will ever run out of medicine.  To make sure that the above recommendation works, it is very important that you make sure your medication refill appointments are scheduled at least 1 week before you run out of medicine. To do this in an effective manner, make sure that you do not leave the office without scheduling your next medication management appointment. Always ask the nursing staff to show you in your prescription , when your medication will be running out. Then arrange for the receptionist to get you a return appointment, at least 7 days before you run out of medicine. Do not wait until you have 1 or 2 pills left, to come in. This is very poor planning and does not take into consideration that we may need to cancel appointments due to bad weather, sickness, or emergencies affecting our staff.  DO NOT ACCEPT A "Partial Fill": If for any reason your pharmacy does not have enough pills/tablets to completely fill or refill your prescription, do not allow for a "partial fill". The law allows the pharmacy to complete that prescription within 72 hours, without requiring a new prescription. If they do not fill the rest of your prescription within those 72 hours, you will need a separate prescription to fill the remaining amount, which we will NOT provide. If the reason for the partial fill is your insurance, you will need to talk to the pharmacist about payment alternatives for the remaining tablets, but again, DO NOT ACCEPT A PARTIAL FILL, unless you can trust your pharmacist to obtain the remainder of the pills within 72 hours.  Prescription refills and/or changes in medication(s):     Prescription refills, and/or changes in dose or medication, will be conducted only during scheduled medication management appointments. (Applies to both, written and electronic prescriptions.)  No refills on procedure days. No medication will be  changed or started on procedure days. No changes, adjustments, and/or refills will be conducted on a procedure day. Doing so will interfere with the diagnostic portion of the procedure.  No phone refills. No medications will be "called into the pharmacy".  No Fax refills.  No weekend refills.  No Holliday refills.  No after hours refills.  Remember:  Business hours are:  Monday to Thursday 8:00 AM to 4:00 PM Provider's Schedule: Ora Bollig, MD - Appointments are:  Medication management: Monday and Wednesday 8:00 AM to 4:00 PM Procedure day: Tuesday and Thursday 7:30 AM to 4:00 PM Bilal Lateef, MD - Appointments are:  Medication management: Tuesday and Thursday 8:00 AM to 4:00 PM Procedure day: Monday and Wednesday 7:30 AM to 4:00 PM (Last update: 05/24/2020) ____________________________________________________________________________________________   ____________________________________________________________________________________________  Medication Rules  Purpose: To inform patients, and their family members, of our rules and regulations.  Applies to: All patients receiving prescriptions (written or electronic).  Pharmacy of record: Pharmacy where electronic prescriptions will be sent. If written prescriptions are taken to a different pharmacy, please inform the nursing staff. The pharmacy listed in the electronic medical record should be the one where you would like electronic prescriptions to be sent.  Electronic prescriptions: In compliance with the [place] Strengthen Opioid Misuse Prevention (STOP) Act of 2017 (Session Law 2017-74/H243), effective November 04, 2018, all controlled substances must be electronically prescribed. Calling prescriptions to the pharmacy will cease to exist.  Prescription refills: Only during scheduled appointments. Applies to all prescriptions.  NOTE: The following applies primarily to controlled substances (Opioid* Pain  Medications).   Type of encounter (visit): For patients receiving controlled substances, face-to-face visits are required. (Not an option or up to the patient.)  Patient's responsibilities: 1. Pain Pills: Bring all pain pills to every appointment (except for procedure appointments). 2. Pill Bottles: Bring pills in original pharmacy bottle. Always bring the newest bottle. Bring bottle, even if empty. 3. Medication refills: You are responsible for knowing and keeping track of what medications you take and those you need refilled. The day before your appointment: write a list of all prescriptions that need to be refilled. The day of the appointment: give the list to the admitting nurse. Prescriptions will be written only during appointments. No prescriptions will be written on procedure days. If you forget a medication: it will not be "Called in", "Faxed", or "electronically sent". You will need to get another appointment to get these prescribed. No early refills. Do not call asking to have your prescription filled early. 4. Prescription Accuracy: You are responsible for carefully inspecting your prescriptions before leaving our office. Have the discharge nurse carefully go over each prescription with you, before taking them home. Make sure that your name is accurately spelled, that your address is correct. Check the name and dose of your medication to make sure it is accurate. Check the number of pills, and the written instructions to make sure they are clear and accurate. Make sure that you are given enough medication to last until your next medication refill appointment. 5. Taking Medication: Take medication as prescribed. When it comes to controlled substances, taking less pills or less frequently than prescribed is permitted and encouraged. Never take more pills than instructed. Never take medication more frequently than prescribed.  6.   Inform other Doctors: Always inform, all of your healthcare  providers, of all the medications you take. 7. Pain Medication from other Providers: You are not allowed to accept any additional pain medication from any other Doctor or Healthcare provider. There are two exceptions to this rule. (see below) In the event that you require additional pain medication, you are responsible for notifying us, as stated below. 8. Cough Medicine: Often these contain an opioid, such as codeine or hydrocodone. Never accept or take cough medicine containing these opioids if you are already taking an opioid* medication. The combination may cause respiratory failure and death. 9. Medication Agreement: You are responsible for carefully reading and following our Medication Agreement. This must be signed before receiving any prescriptions from our practice. Safely store a copy of your signed Agreement. Violations to the Agreement will result in no further prescriptions. (Additional copies of our Medication Agreement are available upon request.) 10. Laws, Rules, & Regulations: All patients are expected to follow all Federal and State Laws, Statutes, Rules, & Regulations. Ignorance of the Laws does not constitute a valid excuse.  11. Illegal drugs and Controlled Substances: The use of illegal substances (including, but not limited to marijuana and its derivatives) and/or the illegal use of any controlled substances is strictly prohibited. Violation of this rule may result in the immediate and permanent discontinuation of any and all prescriptions being written by our practice. The use of any illegal substances is prohibited. 12. Adopted CDC guidelines & recommendations: Target dosing levels will be at or below 60 MME/day. Use of benzodiazepines** is not recommended.  Exceptions: There are only two exceptions to the rule of not receiving pain medications from other Healthcare Providers. 1. Exception #1 (Emergencies): In the event of an emergency (i.e.: accident requiring emergency care), you  are allowed to receive additional pain medication. However, you are responsible for: As soon as you are able, call our office [phone], at any time of the day or night, and leave a message stating your name, the date and nature of the emergency, and the name and dose of the medication prescribed. In the event that your call is answered by a member of our staff, make sure to document and save the date, time, and the name of the person that took your information.  2. Exception #2 (Planned Surgery): In the event that you are scheduled by another doctor or dentist to have any type of surgery or procedure, you are allowed (for a period no longer than 30 days), to receive additional pain medication, for the acute post-op pain. However, in this case, you are responsible for picking up a copy of our "Post-op Pain Management for Surgeons" handout, and giving it to your surgeon or dentist. This document is available at our office, and does not require an appointment to obtain it. Simply go to our office during business hours (Monday-Thursday from 8:00 AM to 4:00 PM) (Friday 8:00 AM to 12:00 Noon) or if you have a scheduled appointment with us, prior to your surgery, and ask for it by name. In addition, you are responsible for: calling our office [phone], at any time of the day or night, and leaving a message stating your name, name of your surgeon, type of surgery, and date of procedure or surgery. Failure to comply with your responsibilities may result in termination of therapy involving the controlled substances.  *Opioid medications include: morphine, codeine, oxycodone, oxymorphone, hydrocodone, hydromorphone, meperidine, tramadol, tapentadol, buprenorphine, fentanyl, methadone. **Benzodiazepine medications include:   diazepam (Valium), alprazolam (Xanax), clonazepam (Klonopine), lorazepam (Ativan), clorazepate (Tranxene), chlordiazepoxide (Librium), estazolam (Prosom), oxazepam (Serax), temazepam  (Restoril), triazolam (Halcion) (Last updated: 10/02/2020) ____________________________________________________________________________________________    

## 2021-06-05 NOTE — Progress Notes (Signed)
PROVIDER NOTE: Information contained herein reflects review and annotations entered in association with encounter. Interpretation of such information and data should be left to medically-trained personnel. Information provided to patient can be located elsewhere in the medical record under "Patient Instructions". Document created using STT-dictation technology, any transcriptional errors that may result from process are unintentional.    Patient: Sophia Jackson  Service Category: E/M  Provider: Gaspar Cola, MD  DOB: [birth year]  DOS: 06/06/2021  Specialty: Interventional Pain Management  MRN: [id number]  Setting: Ambulatory outpatient  PCP: Hilton Sinclair, PA-C  Type: Established Patient    Referring Provider: Barbaraann Boys, MD  Location: Office  Delivery: Face-to-face     HPI  [name], a 60 y.o. year old female, is here today because of her Chronic pain syndrome [G89.4]. Ms. Wellen primary complain today is Knee Pain (Bilateral ) Last encounter: My last encounter with her was on 02/28/2021. Pertinent problems: Ms. Calamia has Osteoarthritis; Common migraine; Myofascial pain; Muscle spasms of lower extremity; Chronic pain syndrome; Osteoarthritis of knee (Bilateral) (L>R); Chronic migraine; Chronic knee pain (1ry area of Pain) (Bilateral) (L>R); Migraine without aura; Chronic musculoskeletal pain; Arthropathy of knee (Right); and Tricompartment osteoarthritis of knee (Right) on their pertinent problem list. Pain Assessment: Severity of Chronic pain is reported as a 5 /10. Location: Knee Left, Right/up and down the legs. Onset: More than a month ago. Quality: Discomfort, Constant, Sharp. Timing: Constant. Modifying factor(s): ice, medications, rest. Vitals:  height is '5\' 6"'  (1.676 m) and weight is 245 lb (111.1 kg). Her temporal temperature is 97.3 F (36.3 C) (abnormal). Her blood pressure is 127/82 and her pulse is 65. Her respiration is 16 and oxygen saturation is 98%.   Reason for  encounter: medication management.   The patient indicates doing well with the current medication regimen. No adverse reactions or side effects reported to the medications.   UDS ordered today.   RTCB: 09/03/2021   Pharmacotherapy Assessment  Analgesic: Hydrocodone/APAP 7.5/325 one every 8 hours (22.5 mg/day of hydrocodone) MME/day: 22.5 mg/day.   Monitoring: [place] PMP: PDMP reviewed during this encounter.       Pharmacotherapy: No side-effects or adverse reactions reported. Compliance: No problems identified. Effectiveness: Clinically acceptable.  Janett Billow, RN  06/06/2021  8:14 AM  Sign when Signing Visit Nursing Pain Medication Assessment:  Safety precautions to be maintained throughout the outpatient stay will include: orient to surroundings, keep bed in low position, maintain call bell within reach at all times, provide assistance with transfer out of bed and ambulation.  Medication Inspection Compliance: Pill count conducted under aseptic conditions, in front of the patient. Neither the pills nor the bottle was removed from the patient's sight at any time. Once count was completed pills were immediately returned to the patient in their original bottle.  Medication: Hydrocodone/APAP Pill/Patch Count:  0 of 90 pills remain Pill/Patch Appearance: Markings consistent with prescribed medication Bottle Appearance: Standard pharmacy container. Clearly labeled. Filled Date: 07 / 01 / 2022 Last Medication intake:  Yesterday    UDS:  Summary  Date Value Ref Range Status  05/03/2020 Note  Corrected    Comment:    ==================================================================== [employer]ox[employer] Select 13 (MW) ==================================================================== Test                             Result       Flag       Units  Drug Present and Declared  for Prescription Verification   Oxazepam                       88           EXPECTED   ng/mg creat   Temazepam                       77           EXPECTED   ng/mg creat    Oxazepam and temazepam are expected metabolites of diazepam.    Oxazepam is also an expected metabolite of other benzodiazepine    drugs, including chlordiazepoxide, prazepam, clorazepate, halazepam,    and temazepam.  Oxazepam and temazepam are available as scheduled    prescription medications.    Hydrocodone                    1735         EXPECTED   ng/mg creat   Hydromorphone                  220          EXPECTED   ng/mg creat   Dihydrocodeine                 319          EXPECTED   ng/mg creat   Norhydrocodone                 2548         EXPECTED   ng/mg creat    Sources of hydrocodone include scheduled prescription medications.    Hydromorphone, dihydrocodeine and norhydrocodone are expected    metabolites of hydrocodone. Hydromorphone and dihydrocodeine are    also available as scheduled prescription medications.  ==================================================================== Test                      Result    Flag   Units      Ref Range   Creatinine              101              mg/dL      >=20 ==================================================================== Declared Medications:  The flagging and interpretation on this report are based on the  following declared medications.  Unexpected results may arise from  inaccuracies in the declared medications.   **Note: The testing scope of this panel includes these medications:   Diazepam (Valium)  Hydrocodone (Norco)   **Note: The testing scope of this panel does not include the  following reported medications:   Acetaminophen (Norco)  Amlodipine (Norvasc)  Atenolol (Tenormin)  Calcium  Cholecalciferol  Hydrochlorothiazide (Hyzaar)  Iron  Losartan (Hyzaar)  Multivitamin  Nortriptyline (Pamelor)  Omeprazole (Prilosec)  Sertraline (Zoloft)  Sumatriptan (Imitrex)  Tizanidine (Zanaflex)  Vitamin  B12 ==================================================================== For clinical consultation, please call [phone]. ====================================================================      ROS  Constitutional: Denies any fever or chills Gastrointestinal: No reported hemesis, hematochezia, vomiting, or acute GI distress Musculoskeletal: Denies any acute onset joint swelling, redness, loss of ROM, or weakness Neurological: No reported episodes of acute onset apraxia, aphasia, dysarthria, agnosia, amnesia, paralysis, loss of coordination, or loss of consciousness  Medication Review  HYDROcodone-acetaminophen, SUMAtriptan, Vitamin D, amLODipine, atenolol, calcium carbonate, diazepam, ferrous sulfate, losartan-hydrochlorothiazide, multivitamin with minerals, nortriptyline, omeprazole, sertraline, tiZANidine, and vitamin B-12  History Review  Allergy: Ms. Nguyen is allergic to 2,4-d  dimethylamine (amisol); diphenhydramine; ibuprofen; morphine and related; nsaids; tetanus toxoids; diclofenac; and buspirone. Drug: Ms. Mijangos  reports no history of drug use. Alcohol:  reports no history of alcohol use. Tobacco:  reports that she has never smoked. She has never used smokeless tobacco. Social: Ms. Pupo  reports that she has never smoked. She has never used smokeless tobacco. She reports that she does not drink alcohol and does not use drugs. Medical:  has a past medical history of Acid reflux (10/27/2007), Arthritis (07/27/2007), Avitaminosis D (10/12/2012), Cataract, Depression, Gonalgia (05/30/2011), H/O cataract extraction (01/20/2015), History of migraine (08/30/2015), Hypertension, Migraines, Osteoarthritis, and Status post bariatric surgery (08/30/2015). Surgical: Ms. Gladman  has a past surgical history that includes Abdominal hysterectomy; Bariatric Surgery; Wrist surgery; Knee surgery; and Tonsillectomy. Family: family history includes Cancer in her mother; Heart disease in her  father.  Laboratory Chemistry Profile   Renal Lab Results  Component Value Date   BUN 15 05/13/2019   CREATININE 1.06 (H) 05/13/2019   BCR 14 05/13/2019   GFRAA 68 05/13/2019   GFRNONAA 59 (L) 05/13/2019    Hepatic Lab Results  Component Value Date   AST 25 05/13/2019   ALT 18 05/23/2016   ALBUMIN 4.5 05/13/2019   ALKPHOS 126 (H) 05/13/2019    Electrolytes Lab Results  Component Value Date   NA 139 05/13/2019   K 3.7 05/13/2019   CL 98 05/13/2019   CALCIUM 9.7 05/13/2019   MG 2.2 05/13/2019    Bone Lab Results  Component Value Date   25OHVITD1 30 05/13/2019   25OHVITD2 <1.0 05/13/2019   25OHVITD3 30 05/13/2019    Inflammation (CRP: Acute Phase) (ESR: Chronic Phase) Lab Results  Component Value Date   CRP 21 (H) 05/13/2019   ESRSEDRATE 54 (H) 05/13/2019         Note: Above Lab results reviewed.  Recent Imaging Review  DG Knee Complete 4 Views Right CLINICAL DATA:  Constant knee pain  EXAM: RIGHT KNEE - COMPLETE 4+ VIEW  COMPARISON:  12/01/2015  FINDINGS: Mild lateral deviation of the patella. Subarticular lucency at the lateral femoral condyle. Moderate arthritis of the lateral compartment. Mild arthritis of the medial compartment. Advanced arthritis of the patellofemoral compartment. Small knee effusion. No acute displaced fracture  IMPRESSION: 1. No acute osseous abnormality. 2. Tricompartment arthritis with probable small knee effusion. 3. Subarticular lucency at the lateral femoral condyle, possibly a subarticular geode or small osteochondral lesion.  Electronically Signed   By: Donavan Foil M.D.   On: 12/27/2019 16:00 Note: Reviewed        Physical Exam  General appearance: Well nourished, well developed, and well hydrated. In no apparent acute distress Mental status: Alert, oriented x 3 (person, place, & time)       Respiratory: No evidence of acute respiratory distress Eyes: PERLA Vitals: BP 127/82 (BP Location: Right Arm, Patient  Position: Sitting, Cuff Size: Large)   Pulse 65   Temp (!) 97.3 F (36.3 C) (Temporal)   Resp 16   Ht '5\' 6"'  (1.676 m)   Wt 245 lb (111.1 kg)   SpO2 98%   BMI 39.54 kg/m  BMI: Estimated body mass index is 39.54 kg/m as calculated from the following:   Height as of this encounter: '5\' 6"'  (1.676 m).   Weight as of this encounter: 245 lb (111.1 kg). Ideal: Ideal body weight: 59.3 kg (130 lb 11.7 oz) Adjusted ideal body weight: 80 kg (176 lb 7 oz)  Assessment   Status Diagnosis  Controlled Controlled Controlled 1. Chronic pain syndrome   2. Chronic knee pain (1ry area of Pain) (Bilateral) (L>R)   3. Osteoarthritis of knee (Bilateral) (L>R)   4. Tricompartment osteoarthritis of knee (Right)   5. Pharmacologic therapy   6. Chronic use of opiate for therapeutic purpose   7. Encounter for medication management      Updated Problems: No problems updated.  Plan of Care  Problem-specific:  No problem-specific Assessment & Plan notes found for this encounter.  Ms. KERISHA GOUGHNOUR has a current medication list which includes the following long-term medication(s): amlodipine, atenolol, ferrous sulfate, hydrocodone-acetaminophen, [START ON 07/05/2021] hydrocodone-acetaminophen, [START ON 08/04/2021] hydrocodone-acetaminophen, losartan-hydrochlorothiazide, nortriptyline, omeprazole, sertraline, and sumatriptan.  Pharmacotherapy (Medications Ordered): Meds ordered this encounter  Medications   HYDROcodone-acetaminophen (NORCO) 7.5-325 MG tablet    Sig: Take 1 tablet by mouth every 8 (eight) hours as needed for severe pain. Must last 30 days    Dispense:  90 tablet    Refill:  0    Not a duplicate. Do NOT delete! Dispense 1 day early if closed on refill date. Avoid benzodiazepines within 8 hours of opioids. Do not send refill requests.   HYDROcodone-acetaminophen (NORCO) 7.5-325 MG tablet    Sig: Take 1 tablet by mouth every 8 (eight) hours as needed for severe pain. Must last 30 days     Dispense:  90 tablet    Refill:  0    Not a duplicate. Do NOT delete! Dispense 1 day early if closed on refill date. Avoid benzodiazepines within 8 hours of opioids. Do not send refill requests.   HYDROcodone-acetaminophen (NORCO) 7.5-325 MG tablet    Sig: Take 1 tablet by mouth every 8 (eight) hours as needed for severe pain. Must last 30 days    Dispense:  90 tablet    Refill:  0    Not a duplicate. Do NOT delete! Dispense 1 day early if closed on refill date. Avoid benzodiazepines within 8 hours of opioids. Do not send refill requests.    Orders:  Orders Placed This Encounter  Procedures   ToxASSURE Select 13 (MW), Urine    Volume: 30 ml(s). Minimum 3 ml of urine is needed. Document temperature of fresh sample. Indications: Long term (current) use of opiate analgesic (Z85.885)    Order Specific Question:   Release to patient    Answer:   Immediate    Follow-up plan:   Return in about 3 months (around 09/03/2021) for (F2F-MM) E/M-day (M,W).     Interventional Therapies  Risk  Complexity Considerations:   Estimated body mass index is 38.74 kg/m as calculated from the following:   Height as of this encounter: '5\' 6"'  (1.676 m).   Weight as of this encounter: 240 lb (108.9 kg). WNL   Planned  Pending:   Pending further evaluation   Considered and offered:   Diagnostic bilateral IA (Hyalgan) knee injections  Diagnostic bilateral IA (steroid) knee injections  Diagnostic bilateral genicular NB  Possible bilateral genicular RFA    Completed:   None since entering practice on 08/30/2015.   Therapeutic  Palliative (PRN) options:   None established    Recent Visits No visits were found meeting these conditions. Showing recent visits within past 90 days and meeting all other requirements Today's Visits Date Type Provider Dept  06/06/21 Office Visit Milinda Pointer, MD [facility]  Showing today's visits and meeting all other requirements Future  Appointments No visits were found meeting these conditions. Showing future appointments  within next 90 days and meeting all other requirements I discussed the assessment and treatment plan with the patient. The patient was provided an opportunity to ask questions and all were answered. The patient agreed with the plan and demonstrated an understanding of the instructions.  Patient advised to call back or seek an in-person evaluation if the symptoms or condition worsens.  Duration of encounter: 30 minutes.  Note by: Gaspar Cola, MD Date: 06/06/2021; Time: 8:36 AM

## 2021-06-06 ENCOUNTER — Encounter: Payer: Self-pay | Admitting: Pain Medicine

## 2021-06-06 ENCOUNTER — Ambulatory Visit: Payer: BC Managed Care – PPO | Attending: Pain Medicine | Admitting: Pain Medicine

## 2021-06-06 ENCOUNTER — Other Ambulatory Visit: Payer: Self-pay

## 2021-06-06 VITALS — BP 127/82 | HR 65 | Temp 97.3°F | Resp 16 | Ht 66.0 in | Wt 245.0 lb

## 2021-06-06 DIAGNOSIS — M170 Bilateral primary osteoarthritis of knee: Secondary | ICD-10-CM | POA: Insufficient documentation

## 2021-06-06 DIAGNOSIS — Z79899 Other long term (current) drug therapy: Secondary | ICD-10-CM | POA: Diagnosis present

## 2021-06-06 DIAGNOSIS — M1711 Unilateral primary osteoarthritis, right knee: Secondary | ICD-10-CM | POA: Diagnosis present

## 2021-06-06 DIAGNOSIS — G8929 Other chronic pain: Secondary | ICD-10-CM | POA: Diagnosis present

## 2021-06-06 DIAGNOSIS — M25562 Pain in left knee: Secondary | ICD-10-CM | POA: Diagnosis present

## 2021-06-06 DIAGNOSIS — M25561 Pain in right knee: Secondary | ICD-10-CM | POA: Diagnosis present

## 2021-06-06 DIAGNOSIS — G894 Chronic pain syndrome: Secondary | ICD-10-CM | POA: Diagnosis present

## 2021-06-06 DIAGNOSIS — Z79891 Long term (current) use of opiate analgesic: Secondary | ICD-10-CM | POA: Diagnosis present

## 2021-06-06 MED ORDER — HYDROCODONE-ACETAMINOPHEN 7.5-325 MG PO TABS: 1.0000 | ORAL_TABLET | Freq: Three times a day (TID) | ORAL | 0 refills | Status: DC | PRN

## 2021-06-06 NOTE — Progress Notes (Signed)
Nursing Pain Medication Assessment:  Safety precautions to be maintained throughout the outpatient stay will include: orient to surroundings, keep bed in low position, maintain call bell within reach at all times, provide assistance with transfer out of bed and ambulation.  Medication Inspection Compliance: Pill count conducted under aseptic conditions, in front of the patient. Neither the pills nor the bottle was removed from the patient's sight at any time. Once count was completed pills were immediately returned to the patient in their original bottle.  Medication: Hydrocodone/APAP Pill/Patch Count:  0 of 90 pills remain Pill/Patch Appearance: Markings consistent with prescribed medication Bottle Appearance: Standard pharmacy container. Clearly labeled. Filled Date: 07 / 01 / 2022 Last Medication intake:  Yesterday

## 2021-06-08 LAB — TOXASSURE SELECT 13 (MW), URINE

## 2021-09-02 NOTE — Progress Notes (Signed)
PROVIDER NOTE: Information contained herein reflects review and annotations entered in association with encounter. Interpretation of such information and data should be left to medically-trained personnel. Information provided to patient can be located elsewhere in the medical record under "Patient Instructions". Document created using STT-dictation technology, any transcriptional errors that may result from process are unintentional.    Patient: Sophia Jackson  Service Category: E/M  Provider: Gaspar Cola, MD  DOB: 11-23-1960  DOS: 09/03/2021  Specialty: Interventional Pain Management  MRN: [id number]  Setting: Ambulatory outpatient  PCP: Hilton Sinclair, PA-C  Type: Established Patient    Referring Provider: Hilton Sinclair, PA-C  Location: Office  Delivery: Face-to-face     HPI  [name], a 60 y.o. year old female, is here today because of her Chronic pain of both knees [M25.561, M25.562, G89.29]. [name] primary complain today is Knee Pain Last encounter: My last encounter with her was on 06/06/2021. Pertinent problems: [name] has Osteoarthritis; Common migraine; Myofascial pain; Muscle spasms of lower extremity; Chronic pain syndrome; Osteoarthritis of knee (Bilateral) (L>R); Migraine; Chronic knee pain (1ry area of Pain) (Bilateral) (L>R); Migraine without aura; Chronic musculoskeletal pain; Arthropathy of knee (Right); and Tricompartment osteoarthritis of knee (Right) on their pertinent problem list. Pain Assessment: Severity of Chronic pain is reported as a 5 /10. Location: Knee Right, Left/Denies. Onset: More than a month ago. Quality: Aching, Constant, Sharp. Timing: Constant. Modifying factor(s): Meds and rest. Vitals:  height is '5\' 6"'  (1.676 m) and weight is 135 lb (61.2 kg). Her temperature is 97.1 F (36.2 C) (abnormal). Her blood pressure is 130/76 and her pulse is 68. Her respiration is 16 and oxygen saturation is 98%.   Reason for encounter: medication  management.   The patient indicates doing well with the current medication regimen. No adverse reactor side effects reported to the medications.   RTCB: 12/01/2021  Pharmacotherapy Assessment  Analgesic: Hydrocodone/APAP 7.5/325 one every 8 hours (22.5 mg/day of hydrocodone) MME/day: 22.5 mg/day.   Monitoring: [place] PMP: PDMP reviewed during this encounter.       Pharmacotherapy: No side-effects or adverse reactions reported. Compliance: No problems identified. Effectiveness: Clinically acceptable.  Chauncey Fischer, RN  09/03/2021  8:09 AM  Sign when Signing Visit Nursing Pain Medication Assessment:  Safety precautions to be maintained throughout the outpatient stay will include: orient to surroundings, keep bed in low position, maintain call bell within reach at all times, provide assistance with transfer out of bed and ambulation.  Medication Inspection Compliance: Pill count conducted under aseptic conditions, in front of the patient. Neither the pills nor the bottle was removed from the patient's sight at any time. Once count was completed pills were immediately returned to the patient in their original bottle.  Medication: Hydrocodone/APAP Pill/Patch Count:  0 of 90 pills remain Pill/Patch Appearance: Markings consistent with prescribed medication Bottle Appearance: Standard pharmacy container. Clearly labeled. Filled Date:  37  / 1 / 2022 Last Medication intake:  Today Safety precautions to be maintained throughout the outpatient stay will include: orient to surroundings, keep bed in low position, maintain call bell within reach at all times, provide assistance with transfer out of bed and ambulation.      UDS:  Summary  Date Value Ref Range Status  06/06/2021 Note  Final    Comment:    ==================================================================== [employer] [facility] 13 (MW) ==================================================================== Test  Result       Flag       Units  Drug Present and Declared for Prescription Verification   Desmethyldiazepam              83           EXPECTED   ng/mg creat   Oxazepam                       200          EXPECTED   ng/mg creat   Temazepam                      282          EXPECTED   ng/mg creat    Desmethyldiazepam, oxazepam, and temazepam are expected metabolites    of diazepam. Desmethyldiazepam and oxazepam are also expected    metabolites of other drugs, including chlordiazepoxide, prazepam,    clorazepate, and halazepam. Oxazepam is an expected metabolite of    temazepam. Oxazepam and temazepam are also available as scheduled    prescription medications.    Hydrocodone                    503          EXPECTED   ng/mg creat   Hydromorphone                  188          EXPECTED   ng/mg creat   Dihydrocodeine                 408          EXPECTED   ng/mg creat   Norhydrocodone                 1272         EXPECTED   ng/mg creat    Sources of hydrocodone include scheduled prescription medications.    Hydromorphone, dihydrocodeine and norhydrocodone are expected    metabolites of hydrocodone. Hydromorphone and dihydrocodeine are    also available as scheduled prescription medications.  ==================================================================== Test                      Result    Flag   Units      Ref Range   Creatinine              60               mg/dL      >=20 ==================================================================== Declared Medications:  The flagging and interpretation on this report are based on the  following declared medications.  Unexpected results may arise from  inaccuracies in the declared medications.   **Note: The testing scope of this panel includes these medications:   Diazepam (Valium)  Hydrocodone (Norco)   **Note: The testing scope of this panel does not include the  following reported medications:   Acetaminophen (Norco)  Amlodipine  (Norvasc)  Atenolol (Tenormin)  Calcium  Cyanocobalamin  Hydrochlorothiazide (Hyzaar)  Iron  Losartan (Hyzaar)  Multivitamin  Nortriptyline (Pamelor)  Omeprazole (Prilosec)  Sertraline (Zoloft)  Sumatriptan (Imitrex)  Tizanidine (Zanaflex)  Vitamin D ==================================================================== For clinical consultation, please call [phone]. ====================================================================      ROS  Constitutional: Denies any fever or chills Gastrointestinal: No reported hemesis, hematochezia, vomiting, or acute GI distress Musculoskeletal: Denies any acute onset joint swelling, redness, loss  of ROM, or weakness Neurological: No reported episodes of acute onset apraxia, aphasia, dysarthria, agnosia, amnesia, paralysis, loss of coordination, or loss of consciousness  Medication Review  Fremanezumab-vfrm, HYDROcodone-acetaminophen, Rimegepant Sulfate, SUMAtriptan, Vitamin D, amLODipine, atenolol, calcium carbonate, diazepam, ferrous sulfate, losartan-hydrochlorothiazide, multivitamin with minerals, nortriptyline, omeprazole, sertraline, tiZANidine, and vitamin B-12  History Review  Allergy: [name] is allergic to 2,4-d dimethylamine (amisol); diphenhydramine; ibuprofen; morphine and related; nsaids; tetanus toxoids; diclofenac; and buspirone. Drug: [name]  reports no history of drug use. Alcohol:  reports no history of alcohol use. Tobacco:  reports that she has never smoked. She has never used smokeless tobacco. Social: [name]  reports that she has never smoked. She has never used smokeless tobacco. She reports that she does not drink alcohol and does not use drugs. Medical:  has a past medical history of Acid reflux (10/27/2007), Arthritis (07/27/2007), Avitaminosis D (10/12/2012), Cataract, Depression, Gonalgia (05/30/2011), H/O cataract extraction (01/20/2015), History of migraine (08/30/2015), Hypertension, Migraines,  Osteoarthritis, and Status post bariatric surgery (08/30/2015). Surgical: [name]  has a past surgical history that includes Abdominal hysterectomy; Bariatric Surgery; Wrist surgery; Knee surgery; and Tonsillectomy. Family: family history includes Cancer in her mother; Heart disease in her father.  Laboratory Chemistry Profile   Renal Lab Results  Component Value Date   BUN 15 05/13/2019   CREATININE 1.06 (H) 05/13/2019   BCR 14 05/13/2019   GFRAA 68 05/13/2019   GFRNONAA 59 (L) 05/13/2019    Hepatic Lab Results  Component Value Date   AST 25 05/13/2019   ALT 18 05/23/2016   ALBUMIN 4.5 05/13/2019   ALKPHOS 126 (H) 05/13/2019    Electrolytes Lab Results  Component Value Date   NA 139 05/13/2019   K 3.7 05/13/2019   CL 98 05/13/2019   CALCIUM 9.7 05/13/2019   MG 2.2 05/13/2019    Bone Lab Results  Component Value Date   25OHVITD1 30 05/13/2019   25OHVITD2 <1.0 05/13/2019   25OHVITD3 30 05/13/2019    Inflammation (CRP: Acute Phase) (ESR: Chronic Phase) Lab Results  Component Value Date   CRP 21 (H) 05/13/2019   ESRSEDRATE 54 (H) 05/13/2019         Note: Above Lab results reviewed.  Recent Imaging Review  DG Knee Complete 4 Views Right CLINICAL DATA:  Constant knee pain  EXAM: RIGHT KNEE - COMPLETE 4+ VIEW  COMPARISON:  12/01/2015  FINDINGS: Mild lateral deviation of the patella. Subarticular lucency at the lateral femoral condyle. Moderate arthritis of the lateral compartment. Mild arthritis of the medial compartment. Advanced arthritis of the patellofemoral compartment. Small knee effusion. No acute displaced fracture  IMPRESSION: 1. No acute osseous abnormality. 2. Tricompartment arthritis with probable small knee effusion. 3. Subarticular lucency at the lateral femoral condyle, possibly a subarticular geode or small osteochondral lesion.  Electronically Signed   By: Donavan Foil M.D.   On: 12/27/2019 16:00 Note: Reviewed        Physical  Exam  General appearance: Well nourished, well developed, and well hydrated. In no apparent acute distress Mental status: Alert, oriented x 3 (person, place, & time)       Respiratory: No evidence of acute respiratory distress Eyes: PERLA Vitals: BP 130/76   Pulse 68   Temp (!) 97.1 F (36.2 C)   Resp 16   Ht '5\' 6"'  (1.676 m)   Wt 135 lb (61.2 kg)   SpO2 98%   BMI 21.79 kg/m  BMI: Estimated body mass index is 21.79 kg/m  as calculated from the following:   Height as of this encounter: '5\' 6"'  (1.676 m).   Weight as of this encounter: 135 lb (61.2 kg). Ideal: Ideal body weight: 59.3 kg (130 lb 11.7 oz) Adjusted ideal body weight: 60.1 kg (132 lb 7 oz)  Assessment   Status Diagnosis  Controlled Controlled Controlled 1. Chronic knee pain (1ry area of Pain) (Bilateral) (L>R)   2. Osteoarthritis of knee (Bilateral) (L>R)   3. Tricompartment osteoarthritis of knee (Right)   4. Chronic pain syndrome   5. Pharmacologic therapy   6. Chronic use of opiate for therapeutic purpose   7. Encounter for medication management      Updated Problems: Problem  Migraine    Plan of Care  Problem-specific:  No problem-specific Assessment & Plan notes found for this encounter.  [name] has a current medication list which includes the following long-term medication(s): amlodipine, atenolol, ferrous sulfate, hydrocodone-acetaminophen, [START ON 10/02/2021] hydrocodone-acetaminophen, [START ON 11/01/2021] hydrocodone-acetaminophen, losartan-hydrochlorothiazide, nortriptyline, omeprazole, sertraline, and sumatriptan.  Pharmacotherapy (Medications Ordered): Meds ordered this encounter  Medications   HYDROcodone-acetaminophen (NORCO) 7.5-325 MG tablet    Sig: Take 1 tablet by mouth every 8 (eight) hours as needed for severe pain. Must last 30 days    Dispense:  90 tablet    Refill:  0    DO NOT: delete (not duplicate); no partial-fill (will deny script to complete), no refill request  (F/U required). DISPENSE: 1 day early if closed on fill date. WARN: No CNS-depressants within 8 hrs of med.   HYDROcodone-acetaminophen (NORCO) 7.5-325 MG tablet    Sig: Take 1 tablet by mouth every 8 (eight) hours as needed for severe pain. Must last 30 days    Dispense:  90 tablet    Refill:  0    DO NOT: delete (not duplicate); no partial-fill (will deny script to complete), no refill request (F/U required). DISPENSE: 1 day early if closed on fill date. WARN: No CNS-depressants within 8 hrs of med.   HYDROcodone-acetaminophen (NORCO) 7.5-325 MG tablet    Sig: Take 1 tablet by mouth every 8 (eight) hours as needed for severe pain. Must last 30 days    Dispense:  90 tablet    Refill:  0    DO NOT: delete (not duplicate); no partial-fill (will deny script to complete), no refill request (F/U required). DISPENSE: 1 day early if closed on fill date. WARN: No CNS-depressants within 8 hrs of med.   Orders:  No orders of the defined types were placed in this encounter.  Follow-up plan:   Return in about 3 months (around 12/01/2021) for Eval-day (M,W), (F2F), (MM).     Interventional Therapies  Risk  Complexity Considerations:   Estimated body mass index is 38.74 kg/m as calculated from the following:   Height as of this encounter: '5\' 6"'  (1.676 m).   Weight as of this encounter: 240 lb (108.9 kg). WNL   Planned  Pending:   Pending further evaluation   Considered and offered:   Diagnostic bilateral IA (Hyalgan) knee injections  Diagnostic bilateral IA (steroid) knee injections  Diagnostic bilateral genicular NB  Possible bilateral genicular RFA    Completed:   None since entering practice on 08/30/2015.   Therapeutic  Palliative (PRN) options:   None established    Recent Visits Date Type Provider Dept  06/06/21 Office Visit Milinda Pointer, MD [facility]  Showing recent visits within past 90 days and meeting all other requirements Today's Visits  Date Type  Provider Dept  09/03/21 Office Visit Milinda Pointer, MD [facility]  Showing today's visits and meeting all other requirements Future Appointments Date Type Provider Dept  11/19/21 Appointment Milinda Pointer, MD [facility]  Showing future appointments within next 90 days and meeting all other requirements I discussed the assessment and treatment plan with the patient. The patient was provided an opportunity to ask questions and all were answered. The patient agreed with the plan and demonstrated an understanding of the instructions.  Patient advised to call back or seek an in-person evaluation if the symptoms or condition worsens.  Duration of encounter: 30 minutes.  Note by: Gaspar Cola, MD Date: 09/03/2021; Time: 8:22 AM

## 2021-09-03 ENCOUNTER — Other Ambulatory Visit: Payer: Self-pay

## 2021-09-03 ENCOUNTER — Encounter: Payer: Self-pay | Admitting: Pain Medicine

## 2021-09-03 ENCOUNTER — Ambulatory Visit: Payer: BC Managed Care – PPO | Attending: Pain Medicine | Admitting: Pain Medicine

## 2021-09-03 VITALS — BP 130/76 | HR 68 | Temp 97.1°F | Resp 16 | Ht 66.0 in | Wt 135.0 lb

## 2021-09-03 DIAGNOSIS — Z79899 Other long term (current) drug therapy: Secondary | ICD-10-CM | POA: Diagnosis present

## 2021-09-03 DIAGNOSIS — M25562 Pain in left knee: Secondary | ICD-10-CM | POA: Diagnosis present

## 2021-09-03 DIAGNOSIS — G8929 Other chronic pain: Secondary | ICD-10-CM | POA: Diagnosis present

## 2021-09-03 DIAGNOSIS — M1711 Unilateral primary osteoarthritis, right knee: Secondary | ICD-10-CM | POA: Diagnosis not present

## 2021-09-03 DIAGNOSIS — M170 Bilateral primary osteoarthritis of knee: Secondary | ICD-10-CM | POA: Diagnosis not present

## 2021-09-03 DIAGNOSIS — G894 Chronic pain syndrome: Secondary | ICD-10-CM | POA: Diagnosis not present

## 2021-09-03 DIAGNOSIS — Z79891 Long term (current) use of opiate analgesic: Secondary | ICD-10-CM | POA: Diagnosis present

## 2021-09-03 DIAGNOSIS — M25561 Pain in right knee: Secondary | ICD-10-CM | POA: Diagnosis not present

## 2021-09-03 MED ORDER — HYDROCODONE-ACETAMINOPHEN 7.5-325 MG PO TABS: 1.0000 | ORAL_TABLET | Freq: Three times a day (TID) | ORAL | 0 refills | Status: DC | PRN

## 2021-09-03 NOTE — Progress Notes (Signed)
Nursing Pain Medication Assessment:  Safety precautions to be maintained throughout the outpatient stay will include: orient to surroundings, keep bed in low position, maintain call bell within reach at all times, provide assistance with transfer out of bed and ambulation.  Medication Inspection Compliance: Pill count conducted under aseptic conditions, in front of the patient. Neither the pills nor the bottle was removed from the patient's sight at any time. Once count was completed pills were immediately returned to the patient in their original bottle.  Medication: Hydrocodone/APAP Pill/Patch Count:  0 of 90 pills remain Pill/Patch Appearance: Markings consistent with prescribed medication Bottle Appearance: Standard pharmacy container. Clearly labeled. Filled Date:  65  / 1 / 2022 Last Medication intake:  Today Safety precautions to be maintained throughout the outpatient stay will include: orient to surroundings, keep bed in low position, maintain call bell within reach at all times, provide assistance with transfer out of bed and ambulation.

## 2021-11-18 NOTE — Progress Notes (Signed)
PROVIDER NOTE: Information contained herein reflects review and annotations entered in association with encounter. Interpretation of such information and data should be left to medically-trained personnel. Information provided to patient can be located elsewhere in the medical record under "Patient Instructions". Document created using STT-dictation technology, any transcriptional errors that may result from process are unintentional.    Patient: Sophia Jackson  Service Category: E/M  Provider: Gaspar Cola, MD  DOB: January 19, 1961  DOS: 11/19/2021  Specialty: Interventional Pain Management  MRN: [id number]  Setting: Ambulatory outpatient  PCP: Hilton Sinclair, PA-C  Type: Established Patient    Referring Provider: Hilton Sinclair, PA-C  Location: Office  Delivery: Face-to-face     HPI  Sophia Jackson, a 61 y.o. year old female, is here today because of her No primary diagnosis found.. Ms. Wish primary complain today is Knee Pain (Right and left) Last encounter: My last encounter with her was on 09/03/2021. Pertinent problems: Sophia Jackson has Osteoarthritis; Common migraine; Myofascial pain; Muscle spasms of lower extremity; Chronic pain syndrome; Osteoarthritis of knee (Bilateral) (L>R); Migraine; Chronic knee pain (1ry area of Pain) (Bilateral) (L>R); Migraine without aura; Chronic musculoskeletal pain; Arthropathy of knee (Right); and Tricompartment osteoarthritis of knee (Right) on their pertinent problem list. Pain Assessment: Severity of Chronic pain is reported as a 5 /10. Location: Knee Right, Left/sometimes up and down legs. Onset: More than a month ago. Quality: Aching, Burning, Sharp, Discomfort. Timing: Constant. Modifying factor(s): medications, rest. Vitals:  height is _0  (1.676 m) and weight is 235 lb (106.6 kg). Her temperature is 97.2 F (36.2 C) (abnormal). Her blood pressure is 131/89 and her pulse is 81. Her oxygen saturation is 100%.   Reason for encounter: medication  management.   The patient indicates doing well with the current medication regimen. No adverse reactions or side effects reported to the medications.   RTCB: 03/01/2022  Pharmacotherapy Assessment  Analgesic: Hydrocodone/APAP 7.5/325 one every 8 hours (22.5 mg/day of hydrocodone) MME/day: 22.5 mg/day.   Monitoring: [place] PMP: PDMP reviewed during this encounter.       Pharmacotherapy: No side-effects or adverse reactions reported. Compliance: No problems identified. Effectiveness: Clinically acceptable.  [name], RN  11/19/2021  8:14 AM  Sign when Signing Visit Nursing Pain Medication Assessment:  Safety precautions to be maintained throughout the outpatient stay will include: orient to surroundings, keep bed in low position, maintain call bell within reach at all times, provide assistance with transfer out of bed and ambulation.  Medication Inspection Compliance: Pill count conducted under aseptic conditions, in front of the patient. Neither the pills nor the bottle was removed from the patient's sight at any time. Once count was completed pills were immediately returned to the patient in their original bottle.  Medication: Markings consistent with prescribed medication Pill/Patch Count:  42 of 90 pills remain Pill/Patch Appearance: Markings consistent with prescribed medication Bottle Appearance: Standard pharmacy container. Clearly labeled. Filled Date: 11 / 29 / 2022 Last Medication intake:  Yesterday    UDS:  Summary  Date Value Ref Range Status  06/06/2021 Note  Final    Comment:    ==================================================================== [employer] [facility] 13 (MW) ==================================================================== Test                             Result       Flag       Units  Drug Present and Declared for Prescription Verification   Desmethyldiazepam  83           EXPECTED   ng/mg creat   Oxazepam                       200           EXPECTED   ng/mg creat   Temazepam                      282          EXPECTED   ng/mg creat    Desmethyldiazepam, oxazepam, and temazepam are expected metabolites    of diazepam. Desmethyldiazepam and oxazepam are also expected    metabolites of other drugs, including chlordiazepoxide, prazepam,    clorazepate, and halazepam. Oxazepam is an expected metabolite of    temazepam. Oxazepam and temazepam are also available as scheduled    prescription medications.    Hydrocodone                    503          EXPECTED   ng/mg creat   Hydromorphone                  188          EXPECTED   ng/mg creat   Dihydrocodeine                 408          EXPECTED   ng/mg creat   Norhydrocodone                 1272         EXPECTED   ng/mg creat    Sources of hydrocodone include scheduled prescription medications.    Hydromorphone, dihydrocodeine and norhydrocodone are expected    metabolites of hydrocodone. Hydromorphone and dihydrocodeine are    also available as scheduled prescription medications.  ==================================================================== Test                      Result    Flag   Units      Ref Range   Creatinine              60               mg/dL      >=20 ==================================================================== Declared Medications:  The flagging and interpretation on this report are based on the  following declared medications.  Unexpected results may arise from  inaccuracies in the declared medications.   **Note: The testing scope of this panel includes these medications:   Diazepam (Valium)  Hydrocodone (Norco)   **Note: The testing scope of this panel does not include the  following reported medications:   Acetaminophen (Norco)  Amlodipine (Norvasc)  Atenolol (Tenormin)  Calcium  Cyanocobalamin  Hydrochlorothiazide (Hyzaar)  Iron  Losartan (Hyzaar)  Multivitamin  Nortriptyline (Pamelor)  Omeprazole (Prilosec)  Sertraline (Zoloft)   Sumatriptan (Imitrex)  Tizanidine (Zanaflex)  Vitamin D ==================================================================== For clinical consultation, please call [phone]. ====================================================================      ROS  Constitutional: Denies any fever or chills Gastrointestinal: No reported hemesis, hematochezia, vomiting, or acute GI distress Musculoskeletal: Denies any acute onset joint swelling, redness, loss of ROM, or weakness Neurological: No reported episodes of acute onset apraxia, aphasia, dysarthria, agnosia, amnesia, paralysis, loss of coordination, or loss of consciousness  Medication Review  Fremanezumab-vfrm, HYDROcodone-acetaminophen, Rimegepant Sulfate, SUMAtriptan, Vitamin D, amLODipine, atenolol, calcium  carbonate, diazepam, ferrous sulfate, losartan-hydrochlorothiazide, multivitamin with minerals, nortriptyline, omeprazole, sertraline, tiZANidine, and vitamin B-12  History Review  Allergy: Sophia Jackson is allergic to 2,4-d dimethylamine (amisol); diphenhydramine; ibuprofen; morphine and related; nsaids; tetanus toxoids; diclofenac; and buspirone. Drug: Sophia Jackson  reports no history of drug use. Alcohol:  reports no history of alcohol use. Tobacco:  reports that she has never smoked. She has never used smokeless tobacco. Social: Sophia Jackson  reports that she has never smoked. She has never used smokeless tobacco. She reports that she does not drink alcohol and does not use drugs. Medical:  has a past medical history of Acid reflux (10/27/2007), Arthritis (07/27/2007), Avitaminosis D (10/12/2012), Cataract, Depression, Gonalgia (05/30/2011), H/O cataract extraction (01/20/2015), History of migraine (08/30/2015), Hypertension, Migraines, Osteoarthritis, and Status post bariatric surgery (08/30/2015). Surgical: Sophia Jackson  has a past surgical history that includes Abdominal hysterectomy; Bariatric Surgery; Wrist surgery; Knee surgery; and  Tonsillectomy. Family: family history includes Cancer in her mother; Heart disease in her father.  Laboratory Chemistry Profile   Renal Lab Results  Component Value Date   BUN 15 05/13/2019   CREATININE 1.06 (H) 05/13/2019   BCR 14 05/13/2019   GFRAA 68 05/13/2019   GFRNONAA 59 (L) 05/13/2019    Hepatic Lab Results  Component Value Date   AST 25 05/13/2019   ALT 18 05/23/2016   ALBUMIN 4.5 05/13/2019   ALKPHOS 126 (H) 05/13/2019    Electrolytes Lab Results  Component Value Date   NA 139 05/13/2019   K 3.7 05/13/2019   CL 98 05/13/2019   CALCIUM 9.7 05/13/2019   MG 2.2 05/13/2019    Bone Lab Results  Component Value Date   25OHVITD1 30 05/13/2019   25OHVITD2 <1.0 05/13/2019   25OHVITD3 30 05/13/2019    Inflammation (CRP: Acute Phase) (ESR: Chronic Phase) Lab Results  Component Value Date   CRP 21 (H) 05/13/2019   ESRSEDRATE 54 (H) 05/13/2019         Note: Above Lab results reviewed.  Recent Imaging Review  DG Knee Complete 4 Views Right CLINICAL DATA:  Constant knee pain  EXAM: RIGHT KNEE - COMPLETE 4+ VIEW  COMPARISON:  12/01/2015  FINDINGS: Mild lateral deviation of the patella. Subarticular lucency at the lateral femoral condyle. Moderate arthritis of the lateral compartment. Mild arthritis of the medial compartment. Advanced arthritis of the patellofemoral compartment. Small knee effusion. No acute displaced fracture  IMPRESSION: 1. No acute osseous abnormality. 2. Tricompartment arthritis with probable small knee effusion. 3. Subarticular lucency at the lateral femoral condyle, possibly a subarticular geode or small osteochondral lesion.  Electronically Signed   By: Donavan Foil M.D.   On: 12/27/2019 16:00 Note: Reviewed        Physical Exam  General appearance: Well nourished, well developed, and well hydrated. In no apparent acute distress Mental status: Alert, oriented x 3 (person, place, & time)       Respiratory: No evidence of  acute respiratory distress Eyes: PERLA Vitals: BP 131/89    Pulse 81    Temp (!) 97.2 F (36.2 C)    Ht _0  (1.676 m)    Wt 235 lb (106.6 kg)    SpO2 100%    BMI 37.93 kg/m  BMI: Estimated body mass index is 37.93 kg/m as calculated from the following:   Height as of this encounter: _1  (1.676 m).   Weight as of this encounter: 235 lb (106.6 kg). Ideal: Ideal body weight: 59.3 kg (130 lb 11.7  oz) Adjusted ideal body weight: 78.2 kg (172 lb 7 oz)  Assessment   Status Diagnosis  Controlled Controlled Controlled 1. Chronic pain syndrome   2. Pharmacologic therapy   3. Chronic use of opiate for therapeutic purpose   4. Encounter for medication management   5. Chronic knee pain (1ry area of Pain) (Bilateral) (L>R)   6. Osteoarthritis of knee (Bilateral) (L>R)   7. Tricompartment osteoarthritis of knee (Right)      Updated Problems: No problems updated.  Plan of Care  Problem-specific:  No problem-specific Assessment & Plan notes found for this encounter.  Sophia Jackson has a current medication list which includes the following long-term medication(s): amlodipine, atenolol, ferrous sulfate, [START ON 12/01/2021] hydrocodone-acetaminophen, [START ON 12/31/2021] hydrocodone-acetaminophen, [START ON 01/30/2022] hydrocodone-acetaminophen, losartan-hydrochlorothiazide, nortriptyline, omeprazole, sertraline, and sumatriptan.  Pharmacotherapy (Medications Ordered): Meds ordered this encounter  Medications   HYDROcodone-acetaminophen (NORCO) 7.5-325 MG tablet    Sig: Take 1 tablet by mouth every 8 (eight) hours as needed for severe pain. Must last 30 days    Dispense:  90 tablet    Refill:  0    DO NOT: delete (not duplicate); no partial-fill (will deny script to complete), no refill request (F/U required). DISPENSE: 1 day early if closed on fill date. WARN: No CNS-depressants within 8 hrs of med.   HYDROcodone-acetaminophen (NORCO) 7.5-325 MG tablet    Sig: Take 1 tablet by  mouth every 8 (eight) hours as needed for severe pain. Must last 30 days    Dispense:  90 tablet    Refill:  0    DO NOT: delete (not duplicate); no partial-fill (will deny script to complete), no refill request (F/U required). DISPENSE: 1 day early if closed on fill date. WARN: No CNS-depressants within 8 hrs of med.   HYDROcodone-acetaminophen (NORCO) 7.5-325 MG tablet    Sig: Take 1 tablet by mouth every 8 (eight) hours as needed for severe pain. Must last 30 days    Dispense:  90 tablet    Refill:  0    DO NOT: delete (not duplicate); no partial-fill (will deny script to complete), no refill request (F/U required). DISPENSE: 1 day early if closed on fill date. WARN: No CNS-depressants within 8 hrs of med.   Orders:  No orders of the defined types were placed in this encounter.  Follow-up plan:   Return in about 3 months (around 03/01/2022) for Eval-day (M,W), (F2F), (MM).     Interventional Therapies  Risk   Complexity Considerations:   Estimated body mass index is 38.74 kg/m as calculated from the following:   Height as of this encounter: _0  (1.676 m).   Weight as of this encounter: 240 lb (108.9 kg). WNL   Planned   Pending:   Pending further evaluation   Considered and offered:   Diagnostic bilateral IA (Hyalgan) knee injections  Diagnostic bilateral IA (steroid) knee injections  Diagnostic bilateral genicular NB  Possible bilateral genicular RFA    Completed:   None since entering practice on 08/30/2015.   Therapeutic   Palliative (PRN) options:   None established    Recent Visits Date Type Provider Dept  09/03/21 Office Visit Milinda Pointer, MD [facility]  Showing recent visits within past 90 days and meeting all other requirements Today's Visits Date Type Provider Dept  11/19/21 Office Visit Milinda Pointer, MD [facility]  Showing today's visits and meeting all other requirements Future Appointments No visits were found meeting  these  conditions. Showing future appointments within next 90 days and meeting all other requirements  I discussed the assessment and treatment plan with the patient. The patient was provided an opportunity to ask questions and all were answered. The patient agreed with the plan and demonstrated an understanding of the instructions.  Patient advised to call back or seek an in-person evaluation if the symptoms or condition worsens.  Duration of encounter: 30 minutes.  Note by: Gaspar Cola, MD Date: 11/19/2021; Time: 8:36 AM

## 2021-11-19 ENCOUNTER — Other Ambulatory Visit: Payer: Self-pay

## 2021-11-19 ENCOUNTER — Ambulatory Visit: Payer: BC Managed Care – PPO | Attending: Pain Medicine | Admitting: Pain Medicine

## 2021-11-19 DIAGNOSIS — Z79899 Other long term (current) drug therapy: Secondary | ICD-10-CM | POA: Diagnosis present

## 2021-11-19 DIAGNOSIS — Z79891 Long term (current) use of opiate analgesic: Secondary | ICD-10-CM

## 2021-11-19 DIAGNOSIS — G894 Chronic pain syndrome: Secondary | ICD-10-CM

## 2021-11-19 DIAGNOSIS — M170 Bilateral primary osteoarthritis of knee: Secondary | ICD-10-CM | POA: Diagnosis present

## 2021-11-19 DIAGNOSIS — G8929 Other chronic pain: Secondary | ICD-10-CM

## 2021-11-19 DIAGNOSIS — M25562 Pain in left knee: Secondary | ICD-10-CM | POA: Insufficient documentation

## 2021-11-19 DIAGNOSIS — M1711 Unilateral primary osteoarthritis, right knee: Secondary | ICD-10-CM

## 2021-11-19 DIAGNOSIS — M25561 Pain in right knee: Secondary | ICD-10-CM | POA: Diagnosis present

## 2021-11-19 MED ORDER — HYDROCODONE-ACETAMINOPHEN 7.5-325 MG PO TABS: 1.0000 | ORAL_TABLET | Freq: Three times a day (TID) | ORAL | 0 refills | Status: DC | PRN

## 2021-11-19 NOTE — Progress Notes (Signed)
Nursing Pain Medication Assessment:  Safety precautions to be maintained throughout the outpatient stay will include: orient to surroundings, keep bed in low position, maintain call bell within reach at all times, provide assistance with transfer out of bed and ambulation.  Medication Inspection Compliance: Pill count conducted under aseptic conditions, in front of the patient. Neither the pills nor the bottle was removed from the patient's sight at any time. Once count was completed pills were immediately returned to the patient in their original bottle.  Medication: Markings consistent with prescribed medication Pill/Patch Count:  42 of 90 pills remain Pill/Patch Appearance: Markings consistent with prescribed medication Bottle Appearance: Standard pharmacy container. Clearly labeled. Filled Date: 70 / 29 / 2022 Last Medication intake:  Yesterday

## 2021-11-19 NOTE — Patient Instructions (Signed)
____________________________________________________________________________________________ ° °Medication Rules ° °Purpose: To inform patients, and their family members, of our rules and regulations. ° °Applies to: All patients receiving prescriptions (written or electronic). ° °Pharmacy of record: Pharmacy where electronic prescriptions will be sent. If written prescriptions are taken to a different pharmacy, please inform the nursing staff. The pharmacy listed in the electronic medical record should be the one where you would like electronic prescriptions to be sent. ° °Electronic prescriptions: In compliance with the [place] Strengthen Opioid Misuse Prevention (STOP) Act of 2017 (Session Law 2017-74/H243), effective November 04, 2018, all controlled substances must be electronically prescribed. Calling prescriptions to the pharmacy will cease to exist. ° °Prescription refills: Only during scheduled appointments. Applies to all prescriptions. ° °NOTE: The following applies primarily to controlled substances (Opioid* Pain Medications).  ° °Type of encounter (visit): For patients receiving controlled substances, face-to-face visits are required. (Not an option or up to the patient.) ° °Patient's responsibilities: °Pain Pills: Bring all pain pills to every appointment (except for procedure appointments). °Pill Bottles: Bring pills in original pharmacy bottle. Always bring the newest bottle. Bring bottle, even if empty. °Medication refills: You are responsible for knowing and keeping track of what medications you take and those you need refilled. °The day before your appointment: write a list of all prescriptions that need to be refilled. °The day of the appointment: give the list to the admitting nurse. Prescriptions will be written only during appointments. No prescriptions will be written on procedure days. °If you forget a medication: it will not be "Called in", "Faxed", or "electronically sent". You will  need to get another appointment to get these prescribed. °No early refills. Do not call asking to have your prescription filled early. °Prescription Accuracy: You are responsible for carefully inspecting your prescriptions before leaving our office. Have the discharge nurse carefully go over each prescription with you, before taking them home. Make sure that your name is accurately spelled, that your address is correct. Check the name and dose of your medication to make sure it is accurate. Check the number of pills, and the written instructions to make sure they are clear and accurate. Make sure that you are given enough medication to last until your next medication refill appointment. °Taking Medication: Take medication as prescribed. When it comes to controlled substances, taking less pills or less frequently than prescribed is permitted and encouraged. °Never take more pills than instructed. °Never take medication more frequently than prescribed.  °Inform other Doctors: Always inform, all of your healthcare providers, of all the medications you take. °Pain Medication from other Providers: You are not allowed to accept any additional pain medication from any other Doctor or Healthcare provider. There are two exceptions to this rule. (see below) In the event that you require additional pain medication, you are responsible for notifying us, as stated below. °Cough Medicine: Often these contain an opioid, such as codeine or hydrocodone. Never accept or take cough medicine containing these opioids if you are already taking an opioid* medication. The combination may cause respiratory failure and death. °Medication Agreement: You are responsible for carefully reading and following our Medication Agreement. This must be signed before receiving any prescriptions from our practice. Safely store a copy of your signed Agreement. Violations to the Agreement will result in no further prescriptions. (Additional copies of our  Medication Agreement are available upon request.) °Laws, Rules, & Regulations: All patients are expected to follow all Federal and State Laws, Statutes, Rules, & Regulations. Ignorance of   the Laws does not constitute a valid excuse.  °Illegal drugs and Controlled Substances: The use of illegal substances (including, but not limited to marijuana and its derivatives) and/or the illegal use of any controlled substances is strictly prohibited. Violation of this rule may result in the immediate and permanent discontinuation of any and all prescriptions being written by our practice. The use of any illegal substances is prohibited. °Adopted CDC guidelines & recommendations: Target dosing levels will be at or below 60 MME/day. Use of benzodiazepines** is not recommended. ° °Exceptions: There are only two exceptions to the rule of not receiving pain medications from other Healthcare Providers. °Exception #1 (Emergencies): In the event of an emergency (i.e.: accident requiring emergency care), you are allowed to receive additional pain medication. However, you are responsible for: As soon as you are able, call our office [phone], at any time of the day or night, and leave a message stating your name, the date and nature of the emergency, and the name and dose of the medication prescribed. In the event that your call is answered by a member of our staff, make sure to document and save the date, time, and the name of the person that took your information.  °Exception #2 (Planned Surgery): In the event that you are scheduled by another doctor or dentist to have any type of surgery or procedure, you are allowed (for a period no longer than 30 days), to receive additional pain medication, for the acute post-op pain. However, in this case, you are responsible for picking up a copy of our "Post-op Pain Management for Surgeons" handout, and giving it to your surgeon or dentist. This document is available at our office, and  does not require an appointment to obtain it. Simply go to our office during business hours (Monday-Thursday from 8:00 AM to 4:00 PM) (Friday 8:00 AM to 12:00 Noon) or if you have a scheduled appointment with us, prior to your surgery, and ask for it by name. In addition, you are responsible for: calling our office [phone], at any time of the day or night, and leaving a message stating your name, name of your surgeon, type of surgery, and date of procedure or surgery. Failure to comply with your responsibilities may result in termination of therapy involving the controlled substances. °Medication Agreement Violation. Following the above rules, including your responsibilities will help you in avoiding a Medication Agreement Violation (“Breaking your Pain Medication Contract”). ° °*Opioid medications include: morphine, codeine, oxycodone, oxymorphone, hydrocodone, hydromorphone, meperidine, tramadol, tapentadol, buprenorphine, fentanyl, methadone. °**Benzodiazepine medications include: diazepam (Valium), alprazolam (Xanax), clonazepam (Klonopine), lorazepam (Ativan), clorazepate (Tranxene), chlordiazepoxide (Librium), estazolam (Prosom), oxazepam (Serax), temazepam (Restoril), triazolam (Halcion) °(Last updated: 08/01/2021) °____________________________________________________________________________________________ ° ____________________________________________________________________________________________ ° °Medication Recommendations and Reminders ° °Applies to: All patients receiving prescriptions (written and/or electronic). ° °Medication Rules & Regulations: These rules and regulations exist for your safety and that of others. They are not flexible and neither are we. Dismissing or ignoring them will be considered "non-compliance" with medication therapy, resulting in complete and irreversible termination of such therapy. (See document titled "Medication Rules" for more details.) In all conscience,  because of safety reasons, we cannot continue providing a therapy where the patient does not follow instructions. ° °Pharmacy of record:  °Definition: This is the pharmacy where your electronic prescriptions will be sent.  °We do not endorse any particular pharmacy, however, we have experienced problems with [facility] not securing enough medication supply for the community. °We do not restrict you   in your choice of pharmacy. However, once we write for your prescriptions, we will NOT be re-sending more prescriptions to fix restricted supply problems created by your pharmacy, or your insurance.  °The pharmacy listed in the electronic medical record should be the one where you want electronic prescriptions to be sent. °If you choose to change pharmacy, simply notify our nursing staff. ° °Recommendations: °Keep all of your pain medications in a safe place, under lock and key, even if you live alone. We will NOT replace lost, stolen, or damaged medication. °After you fill your prescription, take 1 week's worth of pills and put them away in a safe place. You should keep a separate, properly labeled bottle for this purpose. The remainder should be kept in the original bottle. Use this as your primary supply, until it runs out. Once it's gone, then you know that you have 1 week's worth of medicine, and it is time to come in for a prescription refill. If you do this correctly, it is unlikely that you will ever run out of medicine. °To make sure that the above recommendation works, it is very important that you make sure your medication refill appointments are scheduled at least 1 week before you run out of medicine. To do this in an effective manner, make sure that you do not leave the office without scheduling your next medication management appointment. Always ask the nursing staff to show you in your prescription , when your medication will be running out. Then arrange for the receptionist to get you a return appointment,  at least 7 days before you run out of medicine. Do not wait until you have 1 or 2 pills left, to come in. This is very poor planning and does not take into consideration that we may need to cancel appointments due to bad weather, sickness, or emergencies affecting our staff. °DO NOT ACCEPT A "Partial Fill": If for any reason your pharmacy does not have enough pills/tablets to completely fill or refill your prescription, do not allow for a "partial fill". The law allows the pharmacy to complete that prescription within 72 hours, without requiring a new prescription. If they do not fill the rest of your prescription within those 72 hours, you will need a separate prescription to fill the remaining amount, which we will NOT provide. If the reason for the partial fill is your insurance, you will need to talk to the pharmacist about payment alternatives for the remaining tablets, but again, DO NOT ACCEPT A PARTIAL FILL, unless you can trust your pharmacist to obtain the remainder of the pills within 72 hours. ° °Prescription refills and/or changes in medication(s):  °Prescription refills, and/or changes in dose or medication, will be conducted only during scheduled medication management appointments. (Applies to both, written and electronic prescriptions.) °No refills on procedure days. No medication will be changed or started on procedure days. No changes, adjustments, and/or refills will be conducted on a procedure day. Doing so will interfere with the diagnostic portion of the procedure. °No phone refills. No medications will be "called into the pharmacy". °No Fax refills. °No weekend refills. °No Holliday refills. °No after hours refills. ° °Remember:  °Business hours are:  °Monday to Thursday 8:00 AM to 4:00 PM °Provider's Schedule: °Letzy Gullickson, MD - Appointments are:  °Medication management: Monday and Wednesday 8:00 AM to 4:00 PM °Procedure day: Tuesday and Thursday 7:30 AM to 4:00 PM °Bilal Lateef, MD -  Appointments are:  °Medication management: Tuesday and Thursday 8:00   AM to 4:00 PM °Procedure day: Monday and Wednesday 7:30 AM to 4:00 PM °(Last update: 05/24/2020) °____________________________________________________________________________________________ °  °

## 2022-01-14 DIAGNOSIS — R2689 Other abnormalities of gait and mobility: Secondary | ICD-10-CM | POA: Insufficient documentation

## 2022-02-24 NOTE — Progress Notes (Signed)
PROVIDER NOTE: Information contained herein reflects review and annotations entered in association with encounter. Interpretation of such information and data should be left to medically-trained personnel. Information provided to patient can be located elsewhere in the medical record under "Patient Instructions". Document created using STT-dictation technology, any transcriptional errors that may result from process are unintentional.  ?  ?Patient: Sophia Jackson  Service Category: E/M  Provider: Oswaldo Done, MD  ?DOB: [birth year]  DOS: 02/25/2022  Specialty: Interventional Pain Management  ?MRN: [id number]  Setting: Ambulatory outpatient  PCP: Ardyth Man, PA-C  ?Type: Established Patient    Referring Provider: Ardyth Man, PA-C  ?Location: Office  Delivery: Face-to-face    ? ?HPI  ?[name], a 61 y.o. year old female, is here today because of her Chronic pain syndrome [G89.4]. Ms. Huck primary complain today is Knee Pain (bilateral) ?Last encounter: My last encounter with her was on 11/19/2021. ?Pertinent problems: Ms. 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 ?Pain Assessment: Severity of Chronic pain is reported as a 4 /10. Location: Knee Right, Left/right up side of leg to hip. Onset: More than a month ago. Quality:  . Timing: Constant. Modifying factor(s): surgery , meds, rest,ice. ?Vitals:  height is 5\' 6"  (1.676 m) and weight is 238 lb (108 kg). Her temperature is 97.1 ?F (36.2 ?C) (abnormal). Her blood pressure is 106/75 and her pulse is 81. Her  respiration is 16 and oxygen saturation is 98%.  ? ?Reason for encounter: medication management.   The patient indicates doing well with the current medication regimen. No adverse reactions or side effects reported to the medications.  Patient apparently started on Lyrica secondary to some postoperative neurogenic pain in the anterolateral aspect of the right thigh.  She refers having started the medication and 50 mg p.o. twice daily and having increased to 75 mg p.o. twice daily.  She refers no side effects or adverse reactions to the Lyrica.  Today's inspection of the right knee wound looks good.  No redness, swelling, discharge, or any other evidence of infection.  She still has a very small scab where one of the stitches was placed.  She refers the current pain that she is experiencing to be across the patella and up the lateral aspect of the thigh.  She refers the Lyrica to be effective in controlling some of this.  She also indicates that she will be going back to work next week.  Today we talked about her opioid analgesics and the fact that we need to start looking at tapering them down and eventually stopping them.  She refers that she still has the pain from the left knee.  She also refers that the left knee is in just as bad shape as the right.  She claims the surgeon to have offered her a left knee replacement within 4 weeks after the first.  She stated that at that time the memory from the postop pain was still too vivid and she decided to hold on that, probably until next year.  I have reminded the patient that the plan is still the same.  She understood and accepted. ? ?Surgery: Right total knee  arthroplasty by Waylan Bogaobert Henderson, MD ([facility]) (01/10/2022)  ? ?RTCB: 05/30/2022 ? ?Pharmacotherapy Assessment  ?Analgesic: Hydrocodone/APAP 7.5/325 one every 8 hours (22.5 mg/day of hydrocodone) ?MME/day: 22.5 mg/day.  ? ?Monitoring: ?[place] PMP: PDMP reviewed during this encounter.       ?Pharmacotherapy: No  side-effects or adverse reactions reported. ?Compliance: No problems identified. ?Effectiveness: Clinically acceptable. ? ?Newman PiesGarner, Cynthia F, RN  02/25/2022  8:06 AM  Sign when Signing Visit ?Nursing Pain Medication Assessment:  ?Safety precautions to be maintained throughout the outpatient stay will include: orient to surroundings, keep bed in low position, maintain call bell within reach at all times, provide assistance with transfer out of bed and ambulation.  ?Medication Inspection Compliance: Pill count conducted under aseptic conditions, in front of the patient. Neither the pills nor the bottle was removed from the patient's sight at any time. Once count was completed pills were immediately returned to the patient in their original bottle. ? ?Medication: Hydrocodone/APAP ?Pill/Patch Count:  12 of 90 pills remain ?Pill/Patch Appearance: Markings consistent with prescribed medication ?Bottle Appearance: Standard pharmacy container. Clearly labeled. ?Filled Date: 3 / 5429 / 2023 ?Last Medication intake:  Today ?   UDS:  ?Summary  ?Date Value Ref Range Status  ?06/06/2021 Note  Final  ?  Comment:  ?  ==================================================================== ?[employer] Select 13 (MW) ?==================================================================== ?Test                             Result       Flag       Units ? ?Drug Present and Declared for Prescription Verification ?  Desmethyldiazepam              83           EXPECTED   ng/mg creat ?  Oxazepam                       200          EXPECTED   ng/mg creat ?  Temazepam                      282          EXPECTED   ng/mg creat ?   Desmethyldiazepam, oxazepam, and temazepam are expected metabolites ?   of diazepam. Desmethyldiazepam and oxazepam are also expected ?   metabolites of other drugs, including chlordiazepoxide, prazepam, ?   clorazepate, and halazepam. Oxazepam is an expected metabolite of ?   temazepam. Oxazepam and temazepam are also available  as scheduled ?   prescription medications. ? ?  Hydrocodone                    503          EXPECTED   ng/mg creat ?  Hydromorphone                  188          EXPECTED   ng/mg creat ?  Dihydrocodeine                 408          EXPECTED   ng/mg creat ?  Norhydrocodone                 1272         EXPECTED   ng/mg creat ?   Sources of hydrocodone include scheduled  prescription medications. ?   Hydromorphone, dihydrocodeine and norhydrocodone are expected ?   metabolites of hydrocodone. Hydromorphone and dihydrocodeine are ?   also available as scheduled prescription medications. ? ?==================================================================== ?Test                      Result    Flag   Units      Ref Range ?  Creatinine              60               mg/dL      >=03 ?==================================================================== ?Declared Medications: ? The flagging and interpretation on this report are based on the ? following declared medications.  Unexpected results may arise from ? inaccuracies in the declared medications. ? ? **Note: The testing scope of this panel includes these medications: ? ? Diazepam (Valium) ? Hydrocodone (Norco) ? ? **Note: The testing scope of this panel does not include the ? following reported medications: ? ? Acetaminophen (Norco) ? Amlodipine (Norvasc) ? Atenolol (Tenormin) ? Calcium ? Cyanocobalamin ? Hydrochlorothiazide (Hyzaar) ? Iron ? Losartan (Hyzaar) ? Multivitamin ? Nortriptyline (Pamelor) ? Omeprazole (Prilosec) ? Sertraline (Zoloft) ? Sumatriptan (Imitrex) ? Tizanidine (Zanaflex) ? Vitamin D ?==================================================================== ?For clinical consultation, please call [phone]. ?==================================================================== ?  ?  ? ?ROS  ?Constitutional: Denies any fever or chills ?Gastrointestinal: No reported hemesis, hematochezia, vomiting, or acute GI distress ?Musculoskeletal: Denies any acute  onset joint swelling, redness, loss of ROM, or weakness ?Neurological: No reported episodes of acute onset apraxia, aphasia, dysarthria, agnosia, amnesia, paralysis, loss of coordination, or loss of

## 2022-02-25 ENCOUNTER — Ambulatory Visit: Payer: BC Managed Care – PPO | Attending: Pain Medicine | Admitting: Pain Medicine

## 2022-02-25 ENCOUNTER — Encounter: Payer: Self-pay | Admitting: Pain Medicine

## 2022-02-25 VITALS — BP 106/75 | HR 81 | Temp 97.1°F | Resp 16 | Ht 66.0 in | Wt 238.0 lb

## 2022-02-25 DIAGNOSIS — M25562 Pain in left knee: Secondary | ICD-10-CM

## 2022-02-25 DIAGNOSIS — G894 Chronic pain syndrome: Secondary | ICD-10-CM | POA: Diagnosis present

## 2022-02-25 DIAGNOSIS — Z79899 Other long term (current) drug therapy: Secondary | ICD-10-CM

## 2022-02-25 DIAGNOSIS — M25561 Pain in right knee: Secondary | ICD-10-CM | POA: Diagnosis not present

## 2022-02-25 DIAGNOSIS — M170 Bilateral primary osteoarthritis of knee: Secondary | ICD-10-CM | POA: Insufficient documentation

## 2022-02-25 DIAGNOSIS — Z96651 Presence of right artificial knee joint: Secondary | ICD-10-CM | POA: Insufficient documentation

## 2022-02-25 DIAGNOSIS — G8929 Other chronic pain: Secondary | ICD-10-CM | POA: Diagnosis present

## 2022-02-25 DIAGNOSIS — M1712 Unilateral primary osteoarthritis, left knee: Secondary | ICD-10-CM

## 2022-02-25 DIAGNOSIS — M1711 Unilateral primary osteoarthritis, right knee: Secondary | ICD-10-CM | POA: Insufficient documentation

## 2022-02-25 DIAGNOSIS — Z79891 Long term (current) use of opiate analgesic: Secondary | ICD-10-CM | POA: Insufficient documentation

## 2022-02-25 MED ORDER — HYDROCODONE-ACETAMINOPHEN 7.5-325 MG PO TABS: 1.0000 | ORAL_TABLET | Freq: Three times a day (TID) | ORAL | 0 refills | Status: DC | PRN

## 2022-02-25 NOTE — Patient Instructions (Signed)
____________________________________________________________________________________________ ? ?Medication Rules ? ?Purpose: To inform patients, and their family members, of our rules and regulations. ? ?Applies to: All patients receiving prescriptions (written or electronic). ? ?Pharmacy of record: Pharmacy where electronic prescriptions will be sent. If written prescriptions are taken to a different pharmacy, please inform the nursing staff. The pharmacy listed in the electronic medical record should be the one where you would like electronic prescriptions to be sent. ? ?Electronic prescriptions: In compliance with the [place] Strengthen Opioid Misuse Prevention (STOP) Act of 2017 (Session Law 2017-74/H243), effective November 04, 2018, all controlled substances must be electronically prescribed. Calling prescriptions to the pharmacy will cease to exist. ? ?Prescription refills: Only during scheduled appointments. Applies to all prescriptions. ? ?NOTE: The following applies primarily to controlled substances (Opioid* Pain Medications).  ? ?Type of encounter (visit): For patients receiving controlled substances, face-to-face visits are required. (Not an option or up to the patient.) ? ?Patient's responsibilities: ?Pain Pills: Bring all pain pills to every appointment (except for procedure appointments). ?Pill Bottles: Bring pills in original pharmacy bottle. Always bring the newest bottle. Bring bottle, even if empty. ?Medication refills: You are responsible for knowing and keeping track of what medications you take and those you need refilled. ?The day before your appointment: write a list of all prescriptions that need to be refilled. ?The day of the appointment: give the list to the admitting nurse. Prescriptions will be written only during appointments. No prescriptions will be written on procedure days. ?If you forget a medication: it will not be "Called in", "Faxed", or "electronically sent". You will  need to get another appointment to get these prescribed. ?No early refills. Do not call asking to have your prescription filled early. ?Prescription Accuracy: You are responsible for carefully inspecting your prescriptions before leaving our office. Have the discharge nurse carefully go over each prescription with you, before taking them home. Make sure that your name is accurately spelled, that your address is correct. Check the name and dose of your medication to make sure it is accurate. Check the number of pills, and the written instructions to make sure they are clear and accurate. Make sure that you are given enough medication to last until your next medication refill appointment. ?Taking Medication: Take medication as prescribed. When it comes to controlled substances, taking less pills or less frequently than prescribed is permitted and encouraged. ?Never take more pills than instructed. ?Never take medication more frequently than prescribed.  ?Inform other Doctors: Always inform, all of your healthcare providers, of all the medications you take. ?Pain Medication from other Providers: You are not allowed to accept any additional pain medication from any other Doctor or Healthcare provider. There are two exceptions to this rule. (see below) In the event that you require additional pain medication, you are responsible for notifying us, as stated below. ?Cough Medicine: Often these contain an opioid, such as codeine or hydrocodone. Never accept or take cough medicine containing these opioids if you are already taking an opioid* medication. The combination may cause respiratory failure and death. ?Medication Agreement: You are responsible for carefully reading and following our Medication Agreement. This must be signed before receiving any prescriptions from our practice. Safely store a copy of your signed Agreement. Violations to the Agreement will result in no further prescriptions. (Additional copies of our  Medication Agreement are available upon request.) ?Laws, Rules, & Regulations: All patients are expected to follow all Federal and State Laws, Statutes, Rules, & Regulations. Ignorance of   the Laws does not constitute a valid excuse.  ?Illegal drugs and Controlled Substances: The use of illegal substances (including, but not limited to marijuana and its derivatives) and/or the illegal use of any controlled substances is strictly prohibited. Violation of this rule may result in the immediate and permanent discontinuation of any and all prescriptions being written by our practice. The use of any illegal substances is prohibited. ?Adopted CDC guidelines & recommendations: Target dosing levels will be at or below 60 MME/day. Use of benzodiazepines** is not recommended. ? ?Exceptions: There are only two exceptions to the rule of not receiving pain medications from other Healthcare Providers. ?Exception #1 (Emergencies): In the event of an emergency (i.e.: accident requiring emergency care), you are allowed to receive additional pain medication. However, you are responsible for: As soon as you are able, call our office [phone], at any time of the day or night, and leave a message stating your name, the date and nature of the emergency, and the name and dose of the medication prescribed. In the event that your call is answered by a member of our staff, make sure to document and save the date, time, and the name of the person that took your information.  ?Exception #2 (Planned Surgery): In the event that you are scheduled by another doctor or dentist to have any type of surgery or procedure, you are allowed (for a period no longer than 30 days), to receive additional pain medication, for the acute post-op pain. However, in this case, you are responsible for picking up a copy of our "Post-op Pain Management for Surgeons" handout, and giving it to your surgeon or dentist. This document is available at our office, and  does not require an appointment to obtain it. Simply go to our office during business hours (Monday-Thursday from 8:00 AM to 4:00 PM) (Friday 8:00 AM to 12:00 Noon) or if you have a scheduled appointment with us, prior to your surgery, and ask for it by name. In addition, you are responsible for: calling our office [phone], at any time of the day or night, and leaving a message stating your name, name of your surgeon, type of surgery, and date of procedure or surgery. Failure to comply with your responsibilities may result in termination of therapy involving the controlled substances. ?Medication Agreement Violation. Following the above rules, including your responsibilities will help you in avoiding a Medication Agreement Violation (?Breaking your Pain Medication Contract?). ? ?*Opioid medications include: morphine, codeine, oxycodone, oxymorphone, hydrocodone, hydromorphone, meperidine, tramadol, tapentadol, buprenorphine, fentanyl, methadone. ?**Benzodiazepine medications include: diazepam (Valium), alprazolam (Xanax), clonazepam (Klonopine), lorazepam (Ativan), clorazepate (Tranxene), chlordiazepoxide (Librium), estazolam (Prosom), oxazepam (Serax), temazepam (Restoril), triazolam (Halcion) ?(Last updated: 08/01/2021) ?____________________________________________________________________________________________ ? ____________________________________________________________________________________________ ? ?Medication Recommendations and Reminders ? ?Applies to: All patients receiving prescriptions (written and/or electronic). ? ?Medication Rules & Regulations: These rules and regulations exist for your safety and that of others. They are not flexible and neither are we. Dismissing or ignoring them will be considered "non-compliance" with medication therapy, resulting in complete and irreversible termination of such therapy. (See document titled "Medication Rules" for more details.) In all conscience,  because of safety reasons, we cannot continue providing a therapy where the patient does not follow instructions. ? ?Pharmacy of record:  ?Definition: This is the pharmacy where your electronic prescriptions w

## 2022-02-25 NOTE — Progress Notes (Signed)
Nursing Pain Medication Assessment:  ?Safety precautions to be maintained throughout the outpatient stay will include: orient to surroundings, keep bed in low position, maintain call bell within reach at all times, provide assistance with transfer out of bed and ambulation.  ?Medication Inspection Compliance: Pill count conducted under aseptic conditions, in front of the patient. Neither the pills nor the bottle was removed from the patient's sight at any time. Once count was completed pills were immediately returned to the patient in their original bottle. ? ?Medication: Hydrocodone/APAP ?Pill/Patch Count:  12 of 90 pills remain ?Pill/Patch Appearance: Markings consistent with prescribed medication ?Bottle Appearance: Standard pharmacy container. Clearly labeled. ?Filled Date: 3 / 24 / 2023 ?Last Medication intake:  Today ?

## 2022-05-26 NOTE — Progress Notes (Signed)
PROVIDER NOTE: Information contained herein reflects review and annotations entered in association with encounter. Interpretation of such information and data should be left to medically-trained personnel. Information provided to patient can be located elsewhere in the medical record under "Patient Instructions". Document created using STT-dictation technology, any transcriptional errors that may result from process are unintentional.    Patient: Sophia Jackson  Service Category: E/M  Provider: Gaspar Cola, MD  DOB: 09/25/1961  DOS: 05/27/2022  Specialty: Interventional Pain Management  MRN: [id number]  Setting: Ambulatory outpatient  PCP: Hilton Sinclair, PA-C  Type: Established Patient    Referring Provider: Hilton Sinclair, PA-C  Location: Office  Delivery: Face-to-face     HPI  Sophia Jackson, a 61 y.o. year old female, is here today because of her Chronic pain syndrome [G89.4]. Sophia Jackson primary complain today is Knee Pain (left) Last encounter: My last encounter with her was on 02/25/2022. 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 Pain Assessment: Severity of Chronic pain is reported as a 4 /10. Location: Knee Left/upper and lower leg at times. Onset: More than a month ago. Quality: Sharp, Constant. Timing: Constant. Modifying factor(s): medications, ice, rest. Vitals:  height is '5\' 6"'  (1.676 m) and weight is 234 lb (106.1 kg). Her temporal temperature is 97.4 F (36.3 C) (abnormal). Her blood pressure is 145/87 (abnormal) and  her pulse is 90. Her respiration is 15 and oxygen saturation is 99%.   Reason for encounter: medication management.  The patient indicates doing well with the current medication regimen. No adverse reactions or side effects reported to the medications.  The patient recently had her right knee replacement.  No complications.  She refers doing much better.  She refers also that he feels a lot more stable and she is no longer having any pain in that right knee.  She is pending to have the left knee replaced in the near future, but she does not know exactly the date for that surgery.  Will be on the look out for then knowing that she will probably get some pain medication after that surgery, which we have okayed.  UDS ordered today.   RTCB: 08/28/2022  Pharmacotherapy Assessment  Analgesic: Hydrocodone/APAP 7.5/325 one every 8 hours (22.5 mg/day of hydrocodone) MME/day: 22.5 mg/day.   Monitoring: [place] PMP: PDMP reviewed during this encounter.       Pharmacotherapy: No side-effects or adverse reactions reported. Compliance: No problems identified. Effectiveness: Clinically acceptable.  Hart Rochester, RN  05/27/2022  8:06 AM  Signed Nursing Pain Medication Assessment:  Safety precautions to be maintained throughout the outpatient stay will include: orient to surroundings, keep bed in low position, maintain call bell within reach at all times, provide assistance with transfer out of bed and ambulation.  Medication Inspection Compliance: Pill count conducted under aseptic conditions, in front of the patient. Neither the pills nor the bottle was removed from the patient's sight at any time. Once count was completed pills were immediately returned to the patient in their original bottle.  Medication: Hydrocodone/APAP Pill/Patch Count:  9 of 90 pills remain Pill/Patch Appearance: Markings consistent with prescribed medication Bottle Appearance: Standard pharmacy  container. Clearly labeled. Filled  Date: 06 / 27 / 2023 Last Medication intake:  Today    UDS:  Summary  Date Value Ref Range Status  06/06/2021 Note  Final    Comment:    ==================================================================== Tox[employer]SU[employer] Select 13 (MW) ==================================================================== Test                             Result       Flag       Units  Drug Present and Declared for Prescription Verification   Desmethyldiazepam              83           EXPECTED   ng/mg creat   Oxazepam                       200          EXPECTED   ng/mg creat   Temazepam                      282          EXPECTED   ng/mg creat    Desmethyldiazepam, oxazepam, and temazepam are expected metabolites    of diazepam. Desmethyldiazepam and oxazepam are also expected    metabolites of other drugs, including chlordiazepoxide, prazepam,    clorazepate, and halazepam. Oxazepam is an expected metabolite of    temazepam. Oxazepam and temazepam are also available as scheduled    prescription medications.    Hydrocodone                    503          EXPECTED   ng/mg creat   Hydromorphone                  188          EXPECTED   ng/mg creat   Dihydrocodeine                 408          EXPECTED   ng/mg creat   Norhydrocodone                 1272         EXPECTED   ng/mg creat    Sources of hydrocodone include scheduled prescription medications.    Hydromorphone, dihydrocodeine and norhydrocodone are expected    metabolites of hydrocodone. Hydromorphone and dihydrocodeine are    also available as scheduled prescription medications.  ==================================================================== Test                      Result    Flag   Units      Ref Range   Creatinine              60               mg/dL      >=20 ==================================================================== Declared Medications:  The flagging and interpretation on this report are based on the  following declared  medications.  Unexpected results may arise from  inaccuracies in the declared medications.   **Note: The testing scope of this panel includes these medications:   Diazepam (Valium)  Hydrocodone (Norco)   **Note: The testing scope of this panel does not include the  following reported medications:   Acetaminophen (Norco)  Amlodipine (Norvasc)  Atenolol (Tenormin)  Calcium  Cyanocobalamin  Hydrochlorothiazide (Hyzaar)  Iron  Losartan (Hyzaar)  Multivitamin  Nortriptyline (Pamelor)  Omeprazole (Prilosec)  Sertraline (Zoloft)  Sumatriptan (Imitrex)  Tizanidine (Zanaflex)  Vitamin D ==================================================================== For clinical consultation, please call [phone]. ====================================================================      ROS  Constitutional: Denies any fever or chills Gastrointestinal: No reported hemesis, hematochezia, vomiting, or acute GI distress Musculoskeletal: Denies any acute onset joint swelling, redness, loss of ROM, or weakness Neurological: No reported episodes of acute onset apraxia, aphasia, dysarthria, agnosia, amnesia, paralysis, loss of coordination, or loss of consciousness  Medication Review  Fremanezumab-vfrm, HYDROcodone-acetaminophen, Rimegepant Sulfate, SUMAtriptan, Vitamin D, amLODipine, atenolol, calcium carbonate, diazepam, ferrous sulfate, losartan-hydrochlorothiazide, multivitamin with minerals, nortriptyline, omeprazole, pregabalin, sertraline, tiZANidine, and vitamin B-12  History Review  Allergy: Sophia Jackson is allergic to 2,4-d dimethylamine; diphenhydramine; ibuprofen; morphine and related; nsaids; tetanus toxoids; diclofenac; and buspirone. Drug: Sophia Jackson  reports no history of drug use. Alcohol:  reports no history of alcohol use. Tobacco:  reports that she has never smoked. She has never used smokeless tobacco. Social: Sophia Jackson  reports that she has never smoked. She has never used  smokeless tobacco. She reports that she does not drink alcohol and does not use drugs. Medical:  has a past medical history of Acid reflux (10/27/2007), Arthritis (07/27/2007), Avitaminosis D (10/12/2012), Cataract, Depression, Gonalgia (05/30/2011), H/O cataract extraction (01/20/2015), History of migraine (08/30/2015), Hypertension, Migraines, Osteoarthritis, and Status post bariatric surgery (08/30/2015). Surgical: Sophia Jackson  has a past surgical history that includes Abdominal hysterectomy; Bariatric Surgery; Wrist surgery; Knee surgery; and Tonsillectomy. Family: family history includes Cancer in her mother; Heart disease in her father.  Laboratory Chemistry Profile   Renal Lab Results  Component Value Date   BUN 15 05/13/2019   CREATININE 1.06 (H) 05/13/2019   BCR 14 05/13/2019   GFRAA 68 05/13/2019   GFRNONAA 59 (L) 05/13/2019    Hepatic Lab Results  Component Value Date   AST 25 05/13/2019   ALT 18 05/23/2016   ALBUMIN 4.5 05/13/2019   ALKPHOS 126 (H) 05/13/2019    Electrolytes Lab Results  Component Value Date   NA 139 05/13/2019   K 3.7 05/13/2019   CL 98 05/13/2019   CALCIUM 9.7 05/13/2019   MG 2.2 05/13/2019    Bone Lab Results  Component Value Date   25OHVITD1 30 05/13/2019   25OHVITD2 <1.0 05/13/2019   25OHVITD3 30 05/13/2019    Inflammation (CRP: Acute Phase) (ESR: Chronic Phase) Lab Results  Component Value Date   CRP 21 (H) 05/13/2019   ESRSEDRATE 54 (H) 05/13/2019         Note: Above Lab results reviewed.  Recent Imaging Review  DG Knee Complete 4 Views Right CLINICAL DATA:  Constant knee pain  EXAM: RIGHT KNEE - COMPLETE 4+ VIEW  COMPARISON:  12/01/2015  FINDINGS: Mild lateral deviation of the patella. Subarticular lucency at the lateral femoral condyle. Moderate arthritis of the lateral compartment. Mild arthritis of the medial compartment. Advanced arthritis of the patellofemoral compartment. Small knee effusion. No acute displaced  fracture  IMPRESSION: 1. No acute osseous abnormality. 2. Tricompartment arthritis with probable small knee effusion. 3. Subarticular lucency at the lateral femoral condyle, possibly a subarticular geode or small osteochondral lesion.  Electronically Signed   By: Donavan Foil M.D.   On: 12/27/2019 16:00 Note: Reviewed        Physical Exam  General appearance: Well nourished, well developed, and well hydrated. In no apparent acute distress Mental  status: Alert, oriented x 3 (person, place, & time)       Respiratory: No evidence of acute respiratory distress Eyes: PERLA Vitals: BP (!) 145/87   Pulse 90   Temp (!) 97.4 F (36.3 C) (Temporal)   Resp 15   Ht '5\' 6"'  (1.676 m)   Wt 234 lb (106.1 kg)   SpO2 99%   BMI 37.77 kg/m  BMI: Estimated body mass index is 37.77 kg/m as calculated from the following:   Height as of this encounter: '5\' 6"'  (1.676 m).   Weight as of this encounter: 234 lb (106.1 kg). Ideal: Ideal body weight: 59.3 kg (130 lb 11.7 oz) Adjusted ideal body weight: 78 kg (172 lb 0.6 oz)  Assessment   Diagnosis Status  1. Chronic pain syndrome   2. Chronic knee pain (1ry area of Pain) (Bilateral) (L>R)   3. Osteoarthritis of knee (Bilateral) (L>R)   4. Tricompartment osteoarthritis of knee (Right)   5. Pharmacologic therapy   6. Chronic use of opiate for therapeutic purpose   7. Encounter for medication management   8. Encounter for chronic pain management    Controlled Controlled Controlled   Updated Problems: No problems updated.  Plan of Care  Problem-specific:  No problem-specific Assessment & Plan notes found for this encounter.  Sophia Jackson has a current medication list which includes the following long-term medication(s): amlodipine, atenolol, ferrous sulfate, [START ON 05/30/2022] hydrocodone-acetaminophen, [START ON 06/29/2022] hydrocodone-acetaminophen, [START ON 07/29/2022] hydrocodone-acetaminophen, losartan-hydrochlorothiazide,  nortriptyline, omeprazole, pregabalin, sertraline, and sumatriptan.  Pharmacotherapy (Medications Ordered): Meds ordered this encounter  Medications   HYDROcodone-acetaminophen (NORCO) 7.5-325 MG tablet    Sig: Take 1 tablet by mouth every 8 (eight) hours as needed for severe pain. Must last 30 days    Dispense:  90 tablet    Refill:  0    DO NOT: delete (not duplicate); no partial-fill (will deny script to complete), no refill request (F/U required). DISPENSE: 1 day early if closed on fill date. WARN: No CNS-depressants within 8 hrs of med.   HYDROcodone-acetaminophen (NORCO) 7.5-325 MG tablet    Sig: Take 1 tablet by mouth every 8 (eight) hours as needed for severe pain. Must last 30 days    Dispense:  90 tablet    Refill:  0    DO NOT: delete (not duplicate); no partial-fill (will deny script to complete), no refill request (F/U required). DISPENSE: 1 day early if closed on fill date. WARN: No CNS-depressants within 8 hrs of med.   HYDROcodone-acetaminophen (NORCO) 7.5-325 MG tablet    Sig: Take 1 tablet by mouth every 8 (eight) hours as needed for severe pain. Must last 30 days    Dispense:  90 tablet    Refill:  0    DO NOT: delete (not duplicate); no partial-fill (will deny script to complete), no refill request (F/U required). DISPENSE: 1 day early if closed on fill date. WARN: No CNS-depressants within 8 hrs of med.   Orders:  Orders Placed This Encounter  Procedures   ToxASSURE Select 13 (MW), Urine    Volume: 30 ml(s). Minimum 3 ml of urine is needed. Document temperature of fresh sample. Indications: Long term (current) use of opiate analgesic (E95.284)    Order Specific Question:   Release to patient    Answer:   Immediate   Follow-up plan:   Return in about 3 months (around 08/28/2022) for Eval-day (M,W), (F2F), (MM).     Interventional Therapies  Risk  Complexity  Considerations:   Estimated body mass index is 38.41 kg/m as calculated from the following:   Height  as of this encounter: '5\' 6"'  (1.676 m).   Weight as of this encounter: 238 lb (108 kg). WNL   Planned  Pending:      Considered and offered:   Diagnostic bilateral IA (Hyalgan) knee injections  Diagnostic bilateral IA (steroid) knee injections  Diagnostic bilateral genicular NB  Possible bilateral genicular RFA    Completed:   None since entering practice on 08/30/2015. Surgery: Right total knee arthroplasty by Hinda Lenis, MD ([facility]) (01/10/2022)    Therapeutic  Palliative (PRN) options:   None established    Recent Visits No visits were found meeting these conditions. Showing recent visits within past 90 days and meeting all other requirements Today's Visits Date Type Provider Dept  05/27/22 Office Visit Milinda Pointer, MD [facility]  Showing today's visits and meeting all other requirements Future Appointments No visits were found meeting these conditions. Showing future appointments within next 90 days and meeting all other requirements  I discussed the assessment and treatment plan with the patient. The patient was provided an opportunity to ask questions and all were answered. The patient agreed with the plan and demonstrated an understanding of the instructions.  Patient advised to call back or seek an in-person evaluation if the symptoms or condition worsens.  Duration of encounter: 30 minutes.  Total time on encounter, as per AMA guidelines included both the face-to-face and non-face-to-face time personally spent by the physician and/or other qualified health care professional(s) on the day of the encounter (includes time in activities that require the physician or other qualified health care professional and does not include time in activities normally performed by clinical staff). Physician's time may include the following activities when performed: preparing to see the patient (eg, review of tests, pre-charting review of  records) obtaining and/or reviewing separately obtained history performing a medically appropriate examination and/or evaluation counseling and educating the patient/family/caregiver ordering medications, tests, or procedures referring and communicating with other health care professionals (when not separately reported) documenting clinical information in the electronic or other health record independently interpreting results (not separately reported) and communicating results to the patient/ family/caregiver care coordination (not separately reported)  Note by: Gaspar Cola, MD Date: 05/27/2022; Time: 8:13 AM

## 2022-05-27 ENCOUNTER — Ambulatory Visit: Payer: BC Managed Care – PPO | Attending: Pain Medicine | Admitting: Pain Medicine

## 2022-05-27 ENCOUNTER — Encounter: Payer: Self-pay | Admitting: Pain Medicine

## 2022-05-27 VITALS — BP 145/87 | HR 90 | Temp 97.4°F | Resp 15 | Ht 66.0 in | Wt 234.0 lb

## 2022-05-27 DIAGNOSIS — M1711 Unilateral primary osteoarthritis, right knee: Secondary | ICD-10-CM | POA: Diagnosis present

## 2022-05-27 DIAGNOSIS — Z79899 Other long term (current) drug therapy: Secondary | ICD-10-CM | POA: Diagnosis not present

## 2022-05-27 DIAGNOSIS — G8929 Other chronic pain: Secondary | ICD-10-CM | POA: Insufficient documentation

## 2022-05-27 DIAGNOSIS — G894 Chronic pain syndrome: Secondary | ICD-10-CM | POA: Diagnosis present

## 2022-05-27 DIAGNOSIS — M25562 Pain in left knee: Secondary | ICD-10-CM | POA: Insufficient documentation

## 2022-05-27 DIAGNOSIS — Z79891 Long term (current) use of opiate analgesic: Secondary | ICD-10-CM | POA: Insufficient documentation

## 2022-05-27 DIAGNOSIS — M170 Bilateral primary osteoarthritis of knee: Secondary | ICD-10-CM | POA: Insufficient documentation

## 2022-05-27 DIAGNOSIS — M25561 Pain in right knee: Secondary | ICD-10-CM | POA: Insufficient documentation

## 2022-05-27 MED ORDER — HYDROCODONE-ACETAMINOPHEN 7.5-325 MG PO TABS: 1.0000 | ORAL_TABLET | Freq: Three times a day (TID) | ORAL | 0 refills | Status: DC | PRN

## 2022-05-27 MED ORDER — HYDROCODONE-ACETAMINOPHEN 7.5-325 MG PO TABS: 1.0000 | ORAL_TABLET | Freq: Three times a day (TID) | ORAL | 0 refills | Status: AC | PRN

## 2022-05-27 NOTE — Patient Instructions (Signed)

## 2022-05-27 NOTE — Progress Notes (Signed)
Nursing Pain Medication Assessment:  Safety precautions to be maintained throughout the outpatient stay will include: orient to surroundings, keep bed in low position, maintain call bell within reach at all times, provide assistance with transfer out of bed and ambulation.  Medication Inspection Compliance: Pill count conducted under aseptic conditions, in front of the patient. Neither the pills nor the bottle was removed from the patient's sight at any time. Once count was completed pills were immediately returned to the patient in their original bottle.  Medication: Hydrocodone/APAP Pill/Patch Count:  9 of 90 pills remain Pill/Patch Appearance: Markings consistent with prescribed medication Bottle Appearance: Standard pharmacy container. Clearly labeled. Filled Date: 06 / 27 / 2023 Last Medication intake:  Today

## 2022-05-31 LAB — TOXASSURE SELECT 13 (MW), URINE

## 2022-06-10 DIAGNOSIS — M1712 Unilateral primary osteoarthritis, left knee: Secondary | ICD-10-CM | POA: Insufficient documentation

## 2022-07-25 NOTE — Progress Notes (Signed)
PROVIDER NOTE: Information contained herein reflects review and annotations entered in association with encounter. Interpretation of such information and data should be left to medically-trained personnel. Information provided to patient can be located elsewhere in the medical record under "Patient Instructions". Document created using STT-dictation technology, any transcriptional errors that may result from process are unintentional.    Patient: Sophia Jackson  Service Category: E/M  Provider: Gaspar Cola, MD  DOB: 1961-10-11  DOS: 07/29/2022  Referring Provider: Artemio Aly  MRN: [id number]  Specialty: Interventional Pain Management  PCP: Hilton Sinclair, PA-C  Type: Established Patient  Setting: Ambulatory outpatient    Location: Office  Delivery: Face-to-face     HPI  [name], a 61 y.o. year old female, is here today because of her Chronic pain syndrome [G89.4]. [name] primary complain today is Knee Pain (left) Last encounter: My last encounter with her was on 05/27/2022. Pertinent problems: 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 Pain Assessment: Severity of Chronic pain is reported as a 4 /10. Location: Knee Left/radiates up and down. Onset: More than a month ago. Quality: Sharp. Timing: Constant. Modifying factor(s): meds,. Vitals:  height is $RemoveB'5\' 6"'KwdzPQWE$  (1.676 m) and weight is 235 lb (106.6 kg). Her temperature is 97 F (36.1 C) (abnormal). Her blood pressure is 144/98 (abnormal) and her  pulse is 85. Her respiration is 16 and oxygen saturation is 100%.   Reason for encounter: medication management.  The patient indicates doing well with the current medication regimen. No adverse reactions or side effects reported to the medications.   The patient indicates pending surgery on the left knee.  She has indicated that after this surgery, she is planning on coming off of the opioid analgesics.  She had surgery on the right knee and she indicated that it took her about 8 weeks to get over that surgery.  Today she has confessed that she is a little hesitant about coming off of the opioids secondary to the fact that she has been on them for a long time and she is concerned about the issues of withdrawal.  Today we had a long conversation regarding this and I have explained to the patient that I am lowering her opioid dose very slowly and as long as she follows my instructions, she should have very little in terms of symptoms associated with withdrawals.  Today I will provide her with 3 prescriptions were the last one we will change her dose from the 7.5 to the 5 mg hydrocodone pill.  For the first week and that initial prescription we will have her take the 5 mg 4 times daily for a total of 20 mg/day of hydrocodone.  This will be a very modest decrease from her current 22.5 mg/day dose.  After that, we will be decreasing her daily dose by 5 mg/week.  This means that in a period of 4 weeks we should be able to completely come off of her hydrocodone.  I will see her back on follow-up to monitor her progress.  The patient was informed of the plan which she understood and accepted.  RTCB: 12/04/2022  Pharmacotherapy Assessment  Analgesic: Hydrocodone/APAP 7.5/325 one every 8 hours (22.5 mg/day of hydrocodone) MME/day: 22.5 mg/day.   Monitoring: [place] PMP: PDMP reviewed during this encounter.       Pharmacotherapy: No side-effects or adverse reactions reported. Compliance: No problems  identified. Effectiveness: Clinically acceptable.  Sophia Shorter, RN  07/29/2022  7:59 AM  Sign when Signing Visit Nursing Pain Medication Assessment:  Safety precautions to be maintained throughout the outpatient stay will include: orient to surroundings, keep bed in low position, maintain call bell within reach at all times, provide assistance with transfer out of bed and ambulation.  Medication Inspection Compliance: Pill count conducted under aseptic conditions, in front of the patient. Neither the pills nor the bottle was removed from the patient's sight at any time. Once count was completed pills were immediately returned to the patient in their original bottle.  Medication: Hydrocodone/APAP Pill/Patch Count:  10 of 90 pills remain Pill/Patch Appearance: Markings consistent with prescribed medication Bottle Appearance: Standard pharmacy container. Clearly labeled. Filled Date: 08 / 26 / 2023 Last Medication intake:  Yesterday    No results found for: "CBDTHCR" No results found for: "D8THCCBX" No results found for: "D9THCCBX"  UDS:  Summary  Date Value Ref Range Status  05/27/2022 Note  Final    Comment:    ==================================================================== [facility] 13 (MW) ==================================================================== Test                             Result       Flag       Units  Drug Present and Declared for Prescription Verification   Desmethyldiazepam              40           EXPECTED   ng/mg creat   Oxazepam                       206          EXPECTED   ng/mg creat   Temazepam                      102          EXPECTED   ng/mg creat    Desmethyldiazepam, oxazepam, and temazepam are expected metabolites    of diazepam. Desmethyldiazepam and oxazepam are also expected    metabolites of other drugs, including chlordiazepoxide, prazepam,    clorazepate, and halazepam. Oxazepam is an expected metabolite of    temazepam.  Oxazepam and temazepam are also available as scheduled    prescription medications.    Hydrocodone                    676          EXPECTED   ng/mg creat   Hydromorphone                  276          EXPECTED   ng/mg creat   Dihydrocodeine                 444          EXPECTED   ng/mg creat   Norhydrocodone                 1253         EXPECTED   ng/mg creat    Sources  of hydrocodone include scheduled prescription medications.    Hydromorphone, dihydrocodeine and norhydrocodone are expected    metabolites of hydrocodone. Hydromorphone and dihydrocodeine are    also available as scheduled prescription medications.  ==================================================================== Test                      Result    Flag   Units      Ref Range   Creatinine              87               mg/dL      >=20 ==================================================================== Declared Medications:  The flagging and interpretation on this report are based on the  following declared medications.  Unexpected results may arise from  inaccuracies in the declared medications.   **Note: The testing scope of this panel includes these medications:   Diazepam (Valium)  Hydrocodone (Norco)   **Note: The testing scope of this panel does not include the  following reported medications:   Acetaminophen (Norco)  Amlodipine (Norvasc)  Atenolol (Tenormin)  Calcium  Cyanocobalamin  Fremanezumab (Ajovy)  Hydrochlorothiazide (Hyzaar)  Iron  Losartan (Hyzaar)  Multivitamin  Nortriptyline (Pamelor)  Omeprazole (Prilosec)  Pregabalin (Lyrica)  Rimegepant (Nurtec)  Sertraline (Zoloft)  Sumatriptan (Imitrex)  Tizanidine (Zanaflex)  Vitamin D ==================================================================== For clinical consultation, please call [phone]. ====================================================================       ROS  Constitutional: Denies any fever or  chills Gastrointestinal: No reported hemesis, hematochezia, vomiting, or acute GI distress Musculoskeletal: Denies any acute onset joint swelling, redness, loss of ROM, or weakness Neurological: No reported episodes of acute onset apraxia, aphasia, dysarthria, agnosia, amnesia, paralysis, loss of coordination, or loss of consciousness  Medication Review  Atogepant, HYDROcodone-acetaminophen, Rimegepant Sulfate, SUMAtriptan, Vitamin D, amLODipine, atenolol, calcium carbonate, diazepam, ferrous sulfate, losartan-hydrochlorothiazide, multivitamin with minerals, nortriptyline, omeprazole, pregabalin, rosuvastatin, sertraline, tiZANidine, and vitamin B-12  History Review  Allergy: Ms. Fodera is allergic to 2,4-d dimethylamine; diphenhydramine; ibuprofen; morphine and related; nsaids; tetanus toxoids; diclofenac; and buspirone. Drug: Ms. Stillion  reports no history of drug use. Alcohol:  reports no history of alcohol use. Tobacco:  reports that she has never smoked. She has never used smokeless tobacco. Social: Ms. Chilton  reports that she has never smoked. She has never used smokeless tobacco. She reports that she does not drink alcohol and does not use drugs. Medical:  has a past medical history of Acid reflux (10/27/2007), Arthritis (07/27/2007), Avitaminosis D (10/12/2012), Cataract, Depression, Gonalgia (05/30/2011), H/O cataract extraction (01/20/2015), History of migraine (08/30/2015), Hypertension, Migraines, Osteoarthritis, and Status post bariatric surgery (08/30/2015). Surgical: Ms. Fofana  has a past surgical history that includes Abdominal hysterectomy; Bariatric Surgery; Wrist surgery; Knee surgery; and Tonsillectomy. Family: family history includes Cancer in her mother; Heart disease in her father.  Laboratory Chemistry Profile   Renal Lab Results  Component Value Date   BUN 15 05/13/2019   CREATININE 1.06 (H) 05/13/2019   BCR 14 05/13/2019   GFRAA 68 05/13/2019   GFRNONAA 59 (L)  05/13/2019    Hepatic Lab Results  Component Value Date   AST 25 05/13/2019   ALT 18 05/23/2016   ALBUMIN 4.5 05/13/2019   ALKPHOS 126 (H) 05/13/2019    Electrolytes Lab Results  Component Value Date   NA 139 05/13/2019   K 3.7 05/13/2019   CL 98 05/13/2019   CALCIUM 9.7 05/13/2019   MG 2.2 05/13/2019    Bone Lab Results  Component Value Date  25OHVITD1 30 05/13/2019   25OHVITD2 <1.0 05/13/2019   25OHVITD3 30 05/13/2019    Inflammation (CRP: Acute Phase) (ESR: Chronic Phase) Lab Results  Component Value Date   CRP 21 (H) 05/13/2019   ESRSEDRATE 54 (H) 05/13/2019         Note: Above Lab results reviewed.  Recent Imaging Review  DG Knee Complete 4 Views Right CLINICAL DATA:  Constant knee pain  EXAM: RIGHT KNEE - COMPLETE 4+ VIEW  COMPARISON:  12/01/2015  FINDINGS: Mild lateral deviation of the patella. Subarticular lucency at the lateral femoral condyle. Moderate arthritis of the lateral compartment. Mild arthritis of the medial compartment. Advanced arthritis of the patellofemoral compartment. Small knee effusion. No acute displaced fracture  IMPRESSION: 1. No acute osseous abnormality. 2. Tricompartment arthritis with probable small knee effusion. 3. Subarticular lucency at the lateral femoral condyle, possibly a subarticular geode or small osteochondral lesion.  Electronically Signed   By: Donavan Foil M.D.   On: 12/27/2019 16:00 Note: Reviewed        Physical Exam  General appearance: Well nourished, well developed, and well hydrated. In no apparent acute distress Mental status: Alert, oriented x 3 (person, place, & time)       Respiratory: No evidence of acute respiratory distress Eyes: PERLA Vitals: BP (!) 144/98   Pulse 85   Temp (!) 97 F (36.1 C)   Resp 16   Ht _0  (1.676 m)   Wt 235 lb (106.6 kg)   SpO2 100%   BMI 37.93 kg/m  BMI: Estimated body mass index is 37.93 kg/m as calculated from the following:   Height as of this  encounter: _1  (1.676 m).   Weight as of this encounter: 235 lb (106.6 kg). Ideal: Ideal body weight: 59.3 kg (130 lb 11.7 oz) Adjusted ideal body weight: 78.2 kg (172 lb 7 oz)  Assessment   Diagnosis Status  1. Chronic pain syndrome   2. Chronic knee pain (1ry area of Pain) (Bilateral) (L>R)   3. Osteoarthritis of knee (Bilateral) (L>R)   4. Tricompartment osteoarthritis of knee (Right)   5. Pharmacologic therapy   6. Chronic use of opiate for therapeutic purpose   7. Encounter for medication management   8. Encounter for chronic pain management    Controlled Controlled Controlled   Updated Problems: No problems updated.    Plan of Care  Problem-specific:  No problem-specific Assessment & Plan notes found for this encounter.  Ms. ARLETT GOOLD has a current medication list which includes the following long-term medication(s): amlodipine, atenolol, ferrous sulfate, hydrocodone-acetaminophen, [START ON 08/28/2022] hydrocodone-acetaminophen, [START ON 09/27/2022] hydrocodone-acetaminophen, [START ON 10/27/2022] hydrocodone-acetaminophen, losartan-hydrochlorothiazide, nortriptyline, omeprazole, pregabalin, rosuvastatin, sertraline, and sumatriptan.  Pharmacotherapy (Medications Ordered): Meds ordered this encounter  Medications   HYDROcodone-acetaminophen (NORCO) 7.5-325 MG tablet    Sig: Take 1 tablet by mouth every 8 (eight) hours as needed for severe pain. Must last 30 days    Dispense:  90 tablet    Refill:  0    DO NOT: delete (not duplicate); no partial-fill (will deny script to complete), no refill request (F/U required). DISPENSE: 1 day early if closed on fill date. WARN: No CNS-depressants within 8 hrs of med.   HYDROcodone-acetaminophen (NORCO) 7.5-325 MG tablet    Sig: Take 1 tablet by mouth every 8 (eight) hours as needed for severe pain. Must last 30 days    Dispense:  90 tablet    Refill:  0    DO NOT: delete (not  duplicate); no partial-fill (will deny script  to complete), no refill request (F/U required). DISPENSE: 1 day early if closed on fill date. WARN: No CNS-depressants within 8 hrs of med.   HYDROcodone-acetaminophen (NORCO/VICODIN) 5-325 MG tablet    Sig: Take 1 tablet by mouth every 6 (six) hours for 7 days, THEN 1 tablet in the morning, at noon, and at bedtime for 7 days, THEN 1 tablet in the morning and at bedtime for 7 days, THEN 1 tablet daily for 7 days.    Dispense:  70 tablet    Refill:  0    Chronic Pain: STOP Act (Not applicable) Fill 1 day early if closed on refill date. Avoid benzodiazepines within 8 hours of opioids   Orders:  No orders of the defined types were placed in this encounter.  Follow-up plan:   Return in about 4 months (around 12/04/2022) for Eval-day (M,W), (F2F), (MM).     Interventional Therapies  Risk  Complexity Considerations:   Estimated body mass index is 38.41 kg/m as calculated from the following:   Height as of this encounter: _0  (1.676 m).   Weight as of this encounter: 238 lb (108 kg). WNL   Planned  Pending:      Considered and offered:   Diagnostic bilateral IA (Hyalgan) knee injections  Diagnostic bilateral IA (steroid) knee injections  Diagnostic bilateral genicular NB  Possible bilateral genicular RFA    Completed:   None since entering practice on 08/30/2015. Surgery: Right total knee arthroplasty by Hinda Lenis, MD ([facility]) (01/10/2022)    Therapeutic  Palliative (PRN) options:   None established     Recent Visits Date Type Provider Dept  05/27/22 Office Visit Milinda Pointer, MD [facility]  Showing recent visits within past 90 days and meeting all other requirements Today's Visits Date Type Provider Dept  07/29/22 Office Visit Milinda Pointer, MD [facility]  Showing today's visits and meeting all other requirements Future Appointments No visits were found meeting these conditions. Showing future appointments within next 90  days and meeting all other requirements  I discussed the assessment and treatment plan with the patient. The patient was provided an opportunity to ask questions and all were answered. The patient agreed with the plan and demonstrated an understanding of the instructions.  Patient advised to call back or seek an in-person evaluation if the symptoms or condition worsens.  Duration of encounter: 30 minutes.  Total time on encounter, as per AMA guidelines included both the face-to-face and non-face-to-face time personally spent by the physician and/or other qualified health care professional(s) on the day of the encounter (includes time in activities that require the physician or other qualified health care professional and does not include time in activities normally performed by clinical staff). Physician's time may include the following activities when performed: preparing to see the patient (eg, review of tests, pre-charting review of records) obtaining and/or reviewing separately obtained history performing a medically appropriate examination and/or evaluation counseling and educating the patient/family/caregiver ordering medications, tests, or procedures referring and communicating with other health care professionals (when not separately reported) documenting clinical information in the electronic or other health record independently interpreting results (not separately reported) and communicating results to the patient/ family/caregiver care coordination (not separately reported)  Note by: Gaspar Cola, MD Date: 07/29/2022; Time: 10:19 AM

## 2022-07-29 ENCOUNTER — Encounter: Payer: Self-pay | Admitting: Pain Medicine

## 2022-07-29 ENCOUNTER — Ambulatory Visit: Payer: BC Managed Care – PPO | Attending: Pain Medicine | Admitting: Pain Medicine

## 2022-07-29 VITALS — BP 144/98 | HR 85 | Temp 97.0°F | Resp 16 | Ht 66.0 in | Wt 235.0 lb

## 2022-07-29 DIAGNOSIS — M1711 Unilateral primary osteoarthritis, right knee: Secondary | ICD-10-CM | POA: Diagnosis present

## 2022-07-29 DIAGNOSIS — M25561 Pain in right knee: Secondary | ICD-10-CM | POA: Diagnosis not present

## 2022-07-29 DIAGNOSIS — G8929 Other chronic pain: Secondary | ICD-10-CM | POA: Diagnosis present

## 2022-07-29 DIAGNOSIS — G894 Chronic pain syndrome: Secondary | ICD-10-CM | POA: Diagnosis not present

## 2022-07-29 DIAGNOSIS — M170 Bilateral primary osteoarthritis of knee: Secondary | ICD-10-CM | POA: Insufficient documentation

## 2022-07-29 DIAGNOSIS — Z79891 Long term (current) use of opiate analgesic: Secondary | ICD-10-CM | POA: Insufficient documentation

## 2022-07-29 DIAGNOSIS — M25562 Pain in left knee: Secondary | ICD-10-CM | POA: Insufficient documentation

## 2022-07-29 DIAGNOSIS — Z79899 Other long term (current) drug therapy: Secondary | ICD-10-CM | POA: Insufficient documentation

## 2022-07-29 MED ORDER — HYDROCODONE-ACETAMINOPHEN 7.5-325 MG PO TABS: 1.0000 | ORAL_TABLET | Freq: Three times a day (TID) | ORAL | 0 refills | Status: AC | PRN

## 2022-07-29 MED ORDER — HYDROCODONE-ACETAMINOPHEN 5-325 MG PO TABS: ORAL_TABLET | ORAL | 0 refills | Status: AC

## 2022-07-29 NOTE — Progress Notes (Signed)
Nursing Pain Medication Assessment:  Safety precautions to be maintained throughout the outpatient stay will include: orient to surroundings, keep bed in low position, maintain call bell within reach at all times, provide assistance with transfer out of bed and ambulation.  Medication Inspection Compliance: Pill count conducted under aseptic conditions, in front of the patient. Neither the pills nor the bottle was removed from the patient's sight at any time. Once count was completed pills were immediately returned to the patient in their original bottle.  Medication: Hydrocodone/APAP Pill/Patch Count:  10 of 90 pills remain Pill/Patch Appearance: Markings consistent with prescribed medication Bottle Appearance: Standard pharmacy container. Clearly labeled. Filled Date: 08 / 26 / 2023 Last Medication intake:  Yesterday

## 2022-07-29 NOTE — Patient Instructions (Signed)

## 2022-08-05 ENCOUNTER — Encounter: Payer: BC Managed Care – PPO | Admitting: Pain Medicine

## 2022-08-26 ENCOUNTER — Encounter: Payer: BC Managed Care – PPO | Admitting: Pain Medicine

## 2022-11-25 ENCOUNTER — Encounter: Payer: BC Managed Care – PPO | Admitting: Pain Medicine

## 2023-06-09 DIAGNOSIS — F332 Major depressive disorder, recurrent severe without psychotic features: Secondary | ICD-10-CM | POA: Insufficient documentation

## 2023-06-09 DIAGNOSIS — F411 Generalized anxiety disorder: Secondary | ICD-10-CM | POA: Insufficient documentation

## 2023-07-01 ENCOUNTER — Encounter: Payer: Self-pay | Admitting: Emergency Medicine

## 2023-07-01 ENCOUNTER — Ambulatory Visit
Admission: EM | Admit: 2023-07-01 | Discharge: 2023-07-01 | Disposition: A | Discharge status: Home / Self Care | Payer: BC Managed Care – PPO

## 2023-07-01 DIAGNOSIS — M79602 Pain in left arm: Secondary | ICD-10-CM

## 2023-07-01 MED ORDER — OXYCODONE HCL 5 MG PO TABS: 5.0000 mg | ORAL_TABLET | Freq: Four times a day (QID) | ORAL | 0 refills | Status: DC | PRN

## 2023-07-01 MED ORDER — PREDNISONE 20 MG PO TABS: 40.0000 mg | ORAL_TABLET | Freq: Every day | ORAL | 0 refills | Status: DC

## 2023-07-01 NOTE — Discharge Instructions (Addendum)
Today you are evaluated for your arm pain  As there was no injury we have held off on completing x-ray today as bone should be intact  EKG shows heart is beating in a regular rhythm but at a slightly slower pace which is no change from what was seen at your primary care office a few weeks ago  Starting tomorrow take prednisone every morning with food for 5 days to help reduce inflammation ideally will help to tone down your arm pain, may take Tylenol in addition to this medicine  You may continue use of your muscle relaxant and for severe pain you may use oxycodone, please be mindful of this will make you feel sleepy  May use ice or heat over the affected areas in 10 to 15-minute intervals  You may prop arm on pillows whenever sitting and lying for comfort and support  You may massage and stretch as tolerated  At any point if you begin to experience chest pain and any of the following dizziness, lightheadedness, shortness of breath, vision changes or your arm pain worsens in severity please go to the nearest emergency department for immediate evaluation  Please keep upcoming appointment with cardiac specialist

## 2023-07-01 NOTE — ED Provider Notes (Signed)
[facility]    CSN: [id number] Arrival date & time: 07/01/23  1437      History   Chief Complaint Chief Complaint  Patient presents with   Arm Pain    HPI Sophia Jackson is a 62 y.o. female.   Patient presents for evaluation of constant left-sided arm pain radiating from the elbow to the shoulder beginning 4 hours ago.  Symptoms started abruptly without precipitating event, injury or trauma.  Feels as if it is inside of the bone, described as sharp.  Has attempted use of Tylenol for management which has been ineffective.  Has full range of motion.  Concern about her heart, at her last primary care appointment 1 month ago EKG was obtained which showed a possible inferior infarct, has follow-up appointment with cardiology in 1 week.  Denies presence of chest pain or tightness, shortness of breath, dizziness, lightheadedness, headache, visual changes.  Past Medical History:  Diagnosis Date   Acid reflux 10/27/2007   Arthritis 07/27/2007   Avitaminosis D 10/12/2012   Cataract    Depression    Gonalgia 05/30/2011   H/O cataract extraction 01/20/2015   History of migraine 08/30/2015   Hypertension    Migraines    Osteoarthritis    Status post bariatric surgery 08/30/2015    Patient Active Problem List   Diagnosis Date Noted   Primary osteoarthritis of left knee 06/10/2022   History of total knee replacement (TKR) (01/10/2022) (Right) 02/25/2022   Decreased functional mobility 01/14/2022   Uncomplicated opioid dependence (HCC) 06/07/2020   Arthropathy of knee (Right) 06/07/2020   Tricompartment osteoarthritis of knee (Right) 06/07/2020   Elevated sed rate 08/08/2019   Elevated vitamin B12 level 08/08/2019   Chronic musculoskeletal pain 05/05/2019   Pharmacologic therapy 05/05/2019   Disorder of skeletal system 05/05/2019   Problems influencing health status 05/05/2019   Chronic knee pain (1ry area of Pain) (Bilateral) (L>R) 08/18/2017   Other specified health  status 08/18/2017   Osteoarthritis of knee (Bilateral) (L>R) 11/12/2016   Migraine 11/11/2016   Chronic pain syndrome 11/07/2016   Elevated C-reactive protein (CRP) 11/24/2015   Encounter for chronic pain management 11/22/2015   Opiate use 08/30/2015   Long term prescription opiate use 08/30/2015   Long term current use of opiate analgesic 08/30/2015   Encounter for therapeutic drug level monitoring 08/30/2015   Status post bariatric surgery 08/30/2015   Class 3 severe obesity due to excess calories with serious comorbidity and body mass index (BMI) of 40.0 to 44.9 in adult (HCC) 08/30/2015   Osteoarthritis 08/30/2015   Opioid use agreement exists 08/30/2015   Myofascial pain 08/30/2015   Muscle spasms of lower extremity 08/30/2015   Status post cataract extraction and insertion of intraocular lens of left eye 02/10/2015   Status post cataract extraction and insertion of intraocular lens of right eye 01/20/2015   Cataract, nuclear 12/26/2014   AMD (age related macular degeneration) 12/26/2014   Cornea guttata 12/26/2014   Posterior subcapsular polar age-related cataract of both eyes 12/26/2014   Age-related nuclear cataract of both eyes 12/26/2014   Iron deficiency anemia 11/15/2013   Vitamin D deficiency 10/12/2012   Hyperlipidemia 12/02/2011   History of gastric bypass 05/30/2011   Chronic migraine 01/28/2011   Migraine without aura 01/28/2011   Impaired fasting glucose 05/22/2010   Type 2 diabetes mellitus treated without insulin (HCC) 05/22/2010   Essential hypertension 05/22/2010   Allergic rhinitis 02/08/2009   Esophageal reflux 10/27/2007   Depression 07/26/2007  Peptic ulcer 07/26/2007   Major depressive disorder, single episode 07/26/2007    Past Surgical History:  Procedure Laterality Date   ABDOMINAL HYSTERECTOMY     BARIATRIC SURGERY     KNEE SURGERY     TONSILLECTOMY     WRIST SURGERY      OB History   No obstetric history on file.      Home  Medications    Prior to Admission medications   Medication Sig Start Date End Date Taking? Authorizing Provider  busPIRone (BUSPAR) 15 MG tablet Take 15 mg by mouth 2 (two) times daily. 06/09/23  Yes [provider]  DULoxetine (CYMBALTA) 30 MG capsule Take 30 mg by mouth daily. 06/30/23  Yes [provider]  oxyCODONE (ROXICODONE) 5 MG immediate release tablet Take 1 tablet (5 mg total) by mouth every 6 (six) hours as needed for severe pain. 07/01/23  Yes Kelie Gainey R, NP  predniSONE (DELTASONE) 20 MG tablet Take 2 tablets (40 mg total) by mouth daily. 07/01/23  Yes Dalya Maselli R, NP  amLODipine (NORVASC) 5 MG tablet Take 10 mg by mouth daily.    [provider]  atenolol (TENORMIN) 25 MG tablet Take 25 mg by mouth daily.    [provider]  atorvastatin (LIPITOR) 20 MG tablet Take by mouth.    [provider]  calcium carbonate (OSCAL) 1500 (600 Ca) MG TABS tablet Take by mouth.    [provider]  Cholecalciferol (VITAMIN D) 2000 UNITS tablet Take 2,000 Units by mouth daily.    [provider]  diazepam (VALIUM) 5 MG tablet Take 5 mg by mouth every 8 (eight) hours as needed for anxiety (and prior to dental work).     [provider]  ferrous sulfate 325 (65 FE) MG EC tablet Take 325 mg by mouth daily with breakfast.    [provider]  HYDROcodone-acetaminophen (NORCO) 7.5-325 MG tablet Take 1 tablet by mouth every 8 (eight) hours as needed for severe pain. Must last 30 days 07/29/22 08/28/22  Delano Metz, MD  HYDROcodone-acetaminophen (NORCO) 7.5-325 MG tablet Take 1 tablet by mouth every 8 (eight) hours as needed for severe pain. Must last 30 days 08/28/22 09/27/22  Delano Metz, MD  HYDROcodone-acetaminophen (NORCO) 7.5-325 MG tablet Take 1 tablet by mouth every 8 (eight) hours as needed for severe pain. Must last 30 days 09/27/22 10/27/22  Delano Metz, MD  HYDROcodone-acetaminophen  (NORCO/VICODIN) 5-325 MG tablet Take 1 tablet by mouth every 6 (six) hours for 7 days, THEN 1 tablet in the morning, at noon, and at bedtime for 7 days, THEN 1 tablet in the morning and at bedtime for 7 days, THEN 1 tablet daily for 7 days. 10/27/22 11/24/22  Delano Metz, MD  losartan-hydrochlorothiazide (HYZAAR) 100-25 MG tablet Take 1 tablet by mouth daily.    [provider]  Multiple Vitamins-Minerals (MULTIVITAMIN WITH MINERALS) tablet Take 1 tablet by mouth at bedtime.     [provider]  nortriptyline (PAMELOR) 50 MG capsule Take 50 mg by mouth at bedtime.  11/11/16   [provider]  omeprazole (PRILOSEC OTC) 20 MG tablet Take 20 mg by mouth daily as needed.     [provider]  pregabalin (LYRICA) 75 MG capsule Take 75 mg by mouth 2 (two) times daily. 02/22/22   [provider]  propranolol ER (INDERAL LA) 120 MG 24 hr capsule Take 120 mg by mouth daily.    [provider]  QULIPTA 60 MG  TABS Take 1 tablet by mouth daily. 07/23/22   [provider]  Rimegepant Sulfate (NURTEC) 75 MG TBDP Take by mouth.    [provider]  rosuvastatin (CRESTOR) 5 MG tablet  05/21/22   [provider]  sertraline (ZOLOFT) 100 MG tablet Take one and 1/2 tablets (150 mg) by mouth daily 09/06/16   [provider]  SUMAtriptan (IMITREX) 100 MG tablet Take 100 mg by mouth once. as needed for Migraine (May repeat in 2 hours. Max 2/24hours.) 04/04/16   [provider]  tiZANidine (ZANAFLEX) 4 MG tablet  09/23/17   [provider]  vitamin B-12 (CYANOCOBALAMIN) 100 MCG tablet Take 500 mcg by mouth daily.     [provider]    Family History Family History  Problem Relation Age of Onset   Cancer Mother    Heart disease Father     Social History Social History   Tobacco Use   Smoking status: Never   Smokeless tobacco: Never  Vaping Use   Vaping status: Never Used  Substance Use Topics    Alcohol use: No    Alcohol/week: 0.0 standard drinks of alcohol   Drug use: No     Allergies   2,4-d dimethylamine; Diphenhydramine; Doxycycline; Ibuprofen; Morphine and codeine; Nsaids; Sulfa antibiotics; Tetanus immune globulin; Tetanus toxoids; Diclofenac; and Buspirone   Review of Systems Review of Systems   Physical Exam Triage Vital Signs ED Triage Vitals  Encounter Vitals Group     BP 07/01/23 1456 128/84     Systolic BP Percentile --      Diastolic BP Percentile --      Pulse Rate 07/01/23 1456 60     Resp 07/01/23 1456 16     Temp 07/01/23 1456 98.4 F (36.9 C)     Temp Source 07/01/23 1456 Oral     SpO2 07/01/23 1456 96 %     Weight --      Height --      Head Circumference --      Peak Flow --      Pain Score 07/01/23 1454 7     Pain Loc --      Pain Education --      Exclude from Growth Chart --    No data found.  Updated Vital Signs BP 128/84 (BP Location: Right Arm)   Pulse 60   Temp 98.4 F (36.9 C) (Oral)   Resp 16   SpO2 96%   Visual Acuity Right Eye Distance:   Left Eye Distance:   Bilateral Distance:    Right Eye Near:   Left Eye Near:    Bilateral Near:     Physical Exam Constitutional:      Appearance: Normal appearance.  HENT:     Head: Normocephalic.  Eyes:     Extraocular Movements: Extraocular movements intact.  Cardiovascular:     Rate and Rhythm: Normal rate and regular rhythm.     Pulses: Normal pulses.     Heart sounds: Normal heart sounds.  Pulmonary:     Effort: Pulmonary effort is normal.     Breath sounds: Normal breath sounds.  Musculoskeletal:     Comments: No tenderness to the left arm, ecchymosis swelling or deformity noted on exam, 2+ brachial pulse, full range of motion and strength is a 5 out of 5  Neurological:     Mental Status: She is alert and oriented to person, place, and time. Mental status is at baseline.  UC Treatments / Results  Labs (all labs ordered are listed, but only abnormal  results are displayed) Labs Reviewed - No data to display  EKG   Radiology No results found.  Procedures Procedures (including critical care time)  Medications Ordered in UC Medications - No data to display  Initial Impression / Assessment and Plan / UC Course  I have reviewed the triage vital signs and the nursing notes.  Pertinent labs & imaging results that were available during my care of the patient were reviewed by me and considered in my medical decision making (see chart for details).  Left arm pain  Unable to reproduce tenderness, vitals are stable and patient is in no signs of distress nontoxic-appearing, not currently experiencing chest pain or discomfort, due to lack of injury deferring imaging, EKG shows sinus bradycardia, no change based on prior EKGs, discussed with patient, stable for outpatient management, advised to monitor, prescribed prednisone and oxycodone for management, PDMP reviewed, low risk, recommended RICE, given strict precautions that if chest pain occurs that she is to go the nearest emergency department for immediate evaluation and workup otherwise she is to keep upcoming appointment with cardiologist and if arm pain persist she is to follow-up with primary doctor Final Clinical Impressions(s) / UC Diagnoses   Final diagnoses:  Left arm pain     Discharge Instructions      Today you are evaluated for your arm pain  As there was no injury we have held off on completing x-ray today as bone should be intact  EKG shows heart is beating in a regular rhythm but at a slightly slower pace which is no change from what was seen at your primary care office a few weeks ago  Starting tomorrow take prednisone every morning with food for 5 days to help reduce inflammation ideally will help to tone down your arm pain, may take Tylenol in addition to this medicine  You may continue use of your muscle relaxant and for severe pain you may use oxycodone, please  be mindful of this will make you feel sleepy  May use ice or heat over the affected areas in 10 to 15-minute intervals  You may prop arm on pillows whenever sitting and lying for comfort and support  You may massage and stretch as tolerated  At any point if you begin to experience chest pain and any of the following dizziness, lightheadedness, shortness of breath, vision changes or your arm pain worsens in severity please go to the nearest emergency department for immediate evaluation  Please keep upcoming appointment with cardiac specialist   ED Prescriptions     Medication Sig Dispense Auth. Provider   predniSONE (DELTASONE) 20 MG tablet Take 2 tablets (40 mg total) by mouth daily. 10 tablet Wyeth Hoffer R, NP   oxyCODONE (ROXICODONE) 5 MG immediate release tablet Take 1 tablet (5 mg total) by mouth every 6 (six) hours as needed for severe pain. 10 tablet Valinda Hoar, NP      I have reviewed the PDMP during this encounter.   Valinda Hoar, [place] 07/01/23 [phone]

## 2023-07-01 NOTE — ED Triage Notes (Signed)
Pt presents with left side arm pain that started today. The pain is from her wrist up to her shoulder. She describes the pain as sharp and constant.

## 2023-11-26 ENCOUNTER — Ambulatory Visit
Admission: EM | Admit: 2023-11-26 | Discharge: 2023-11-26 | Disposition: A | Discharge status: Home / Self Care | Payer: 59 | Attending: Physician Assistant | Admitting: Physician Assistant

## 2023-11-26 DIAGNOSIS — R0981 Nasal congestion: Secondary | ICD-10-CM | POA: Insufficient documentation

## 2023-11-26 DIAGNOSIS — J101 Influenza due to other identified influenza virus with other respiratory manifestations: Secondary | ICD-10-CM | POA: Diagnosis present

## 2023-11-26 DIAGNOSIS — R051 Acute cough: Secondary | ICD-10-CM | POA: Insufficient documentation

## 2023-11-26 LAB — RESP PANEL BY RT-PCR (FLU A&B, COVID) ARPGX2
Influenza A by PCR: POSITIVE — AB
Influenza B by PCR: NEGATIVE
SARS Coronavirus 2 by RT PCR: NEGATIVE

## 2023-11-26 MED ORDER — OSELTAMIVIR PHOSPHATE 75 MG PO CAPS: 75.0000 mg | ORAL_CAPSULE | Freq: Two times a day (BID) | ORAL | 0 refills | Status: AC

## 2023-11-26 MED ORDER — PROMETHAZINE-DM 6.25-15 MG/5ML PO SYRP: 5.0000 mL | ORAL_SOLUTION | Freq: Four times a day (QID) | ORAL | 0 refills | Status: AC | PRN

## 2023-11-26 MED ORDER — ACETAMINOPHEN 325 MG PO TABS
975.0000 mg | ORAL_TABLET | Freq: Once | ORAL | Status: AC
  Administered 2023-11-26: 975 mg via ORAL

## 2023-11-26 NOTE — ED Triage Notes (Signed)
Pt c/o chills,cough & bodyaches x2 days.

## 2023-11-26 NOTE — Discharge Instructions (Addendum)
-  Positive for influenza A. - I sent Tamiflu to the pharmacy. - I also sent cough medicine.  Increase rest and fluids. - Tylenol as needed for fever but you should be breaking the fever in a couple more days. - Need to be seen again if uncontrolled fever, weakness or shortness of breath.  Flu symptoms can last for a couple weeks.

## 2023-11-26 NOTE — ED Provider Notes (Signed)
[facility]    CSN: [id number] Arrival date & time: 11/26/23  1430      History   Chief Complaint Chief Complaint  Patient presents with   Cough   Chills   Generalized Body Aches    HPI Sophia Jackson is a 63 y.o. female presenting for 2-day history of fever, chills, body aches, cough, congestion, mild sore throat.  Denies sinus pain, ear pain, chest pain or shortness of breath.  Reports difficulty sleeping due to cough.  Reports her boss has been sick.  She does not know if the boss has flu or not.  Patient tested negative for COVID at home.  She has tried OTC meds without relief.  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   Primary osteoarthritis of left knee 06/10/2022   History of total knee replacement (TKR) (01/10/2022) (Right) 02/25/2022   Decreased functional mobility 01/14/2022   Uncomplicated opioid dependence (HCC) 06/07/2020   Arthropathy of knee (Right) 06/07/2020   Tricompartment osteoarthritis of knee (Right) 06/07/2020   Elevated sed rate 08/08/2019   Elevated vitamin B12 level 08/08/2019   Chronic musculoskeletal pain 05/05/2019   Pharmacologic therapy 05/05/2019   Disorder of skeletal system 05/05/2019   Problems influencing health status 05/05/2019   Chronic knee pain (1ry area of Pain) (Bilateral) (L>R) 08/18/2017   Other specified health status 08/18/2017   Osteoarthritis of knee (Bilateral) (L>R) 11/12/2016   Migraine 11/11/2016   Chronic pain syndrome 11/07/2016    Elevated C-reactive protein (CRP) 11/24/2015   Encounter for chronic pain management 11/22/2015   Opiate use 08/30/2015   Long term prescription opiate use 08/30/2015   Long term current use of opiate analgesic 08/30/2015   Encounter for therapeutic drug level monitoring 08/30/2015   Status post bariatric surgery 08/30/2015   Class 3 severe obesity due to excess calories with serious comorbidity and body mass index (BMI) of 40.0 to 44.9 in adult (HCC) 08/30/2015   Osteoarthritis 08/30/2015   Opioid use agreement exists 08/30/2015   Myofascial pain 08/30/2015   Muscle spasms of lower extremity 08/30/2015   Status post cataract extraction and insertion of intraocular lens of left eye 02/10/2015   Status post cataract extraction and insertion of intraocular lens of right eye 01/20/2015   Cataract, nuclear 12/26/2014   AMD (age related macular degeneration) 12/26/2014   Cornea guttata 12/26/2014   Posterior subcapsular polar age-related cataract of both eyes 12/26/2014   Age-related nuclear cataract of both eyes 12/26/2014   Iron deficiency anemia 11/15/2013   Vitamin D deficiency 10/12/2012   Hyperlipidemia 12/02/2011   History of gastric bypass 05/30/2011   Chronic migraine 01/28/2011   Migraine without aura 01/28/2011   Impaired fasting glucose 05/22/2010   Type 2 diabetes mellitus treated without insulin (HCC) 05/22/2010   Essential hypertension 05/22/2010   Allergic rhinitis 02/08/2009   Esophageal reflux 10/27/2007   Depression 07/26/2007   Peptic ulcer 07/26/2007   Major depressive disorder, single episode 07/26/2007    Past  Surgical History:  Procedure Laterality Date   ABDOMINAL HYSTERECTOMY     BARIATRIC SURGERY     KNEE SURGERY     TONSILLECTOMY     WRIST SURGERY      OB History   No obstetric history on file.      Home Medications    Prior to Admission medications   Medication Sig Start Date End Date Taking? Authorizing Provider  amLODipine (NORVASC) 5 MG  tablet Take 10 mg by mouth daily.   Yes [provider]  atenolol (TENORMIN) 25 MG tablet Take 25 mg by mouth daily.   Yes [provider]  atorvastatin (LIPITOR) 20 MG tablet Take by mouth.   Yes [provider]  busPIRone (BUSPAR) 15 MG tablet Take 15 mg by mouth 2 (two) times daily. 06/09/23  Yes [provider]  calcium carbonate (OSCAL) 1500 (600 Ca) MG TABS tablet Take by mouth.   Yes [provider]  Cholecalciferol (VITAMIN D) 2000 UNITS tablet Take 2,000 Units by mouth daily.   Yes [provider]  clonazePAM (KLONOPIN) 0.5 MG tablet Take 0.5 mg by mouth daily. 11/11/23  Yes [provider]  diazepam (VALIUM) 5 MG tablet Take 5 mg by mouth every 8 (eight) hours as needed for anxiety (and prior to dental work).    Yes [provider]  DULoxetine (CYMBALTA) 30 MG capsule Take 30 mg by mouth daily. 06/30/23  Yes [provider]  escitalopram (LEXAPRO) 20 MG tablet Take 20 mg by mouth daily. 11/11/23  Yes [provider]  ferrous sulfate 325 (65 FE) MG EC tablet Take 325 mg by mouth daily with breakfast.   Yes [provider]  HYDROcodone-acetaminophen (NORCO) 7.5-325 MG tablet Take 1 tablet by mouth every 8 (eight) hours as needed for severe pain. Must last 30 days 07/29/22 11/26/23 Yes Delano Metz, MD  HYDROcodone-acetaminophen (NORCO) 7.5-325 MG tablet Take 1 tablet by mouth every 8 (eight) hours as needed for severe pain. Must last 30 days 08/28/22 11/26/23 Yes Delano Metz, MD  HYDROcodone-acetaminophen (NORCO) 7.5-325 MG tablet Take 1 tablet by mouth every 8 (eight) hours as needed for severe pain. Must last 30 days 09/27/22 11/26/23 Yes Delano Metz, MD  HYDROcodone-acetaminophen (NORCO/VICODIN) 5-325 MG tablet Take 1 tablet by mouth every 6 (six) hours for 7 days, THEN 1 tablet in the morning, at noon, and at bedtime for 7 days, THEN 1 tablet in the morning and at bedtime for 7 days,  THEN 1 tablet daily for 7 days. 10/27/22 11/26/23 Yes Delano Metz, MD  losartan-hydrochlorothiazide (HYZAAR) 100-25 MG tablet Take 1 tablet by mouth daily.   Yes [provider]  Multiple Vitamins-Minerals (MULTIVITAMIN WITH MINERALS) tablet Take 1 tablet by mouth at bedtime.    Yes [provider]  nortriptyline (PAMELOR) 50 MG capsule Take 50 mg by mouth at bedtime.  11/11/16  Yes [provider]  omeprazole (PRILOSEC OTC) 20 MG tablet Take 20 mg by mouth daily as needed.    Yes [provider]  oseltamivir (TAMIFLU) 75 MG capsule Take 1 capsule (75 mg total) by mouth every 12 (twelve) hours for 5 days. 11/26/23 12/01/23 Yes Shirlee Latch, PA-C  pregabalin (LYRICA) 75 MG capsule Take 75 mg by mouth 2 (two) times daily. 02/22/22  Yes [provider]  promethazine-dextromethorphan (PROMETHAZINE-DM) 6.25-15 MG/5ML syrup Take 5 mLs by mouth 4 (four) times daily as needed. 11/26/23  Yes Eusebio Friendly B, PA-C  propranolol ER (INDERAL LA) 120 MG  24 hr capsule Take 120 mg by mouth daily.   Yes [provider]  QULIPTA 60 MG TABS Take 1 tablet by mouth daily. 07/23/22  Yes [provider]  Rimegepant Sulfate (NURTEC) 75 MG TBDP Take by mouth.   Yes [provider]  rosuvastatin (CRESTOR) 5 MG tablet  05/21/22  Yes [provider]  sertraline (ZOLOFT) 100 MG tablet Take one and 1/2 tablets (150 mg) by mouth daily 09/06/16  Yes [provider]  SUMAtriptan (IMITREX) 100 MG tablet Take 100 mg by mouth once. as needed for Migraine (May repeat in 2 hours. Max 2/24hours.) 04/04/16  Yes [provider]  tiZANidine (ZANAFLEX) 4 MG tablet  09/23/17  Yes [provider]  vitamin B-12 (CYANOCOBALAMIN) 100 MCG tablet Take 500 mcg by mouth daily.    Yes [provider]    Family History Family History  Problem Relation Age of Onset   Cancer Mother    Heart disease Father     Social History Social  History   Tobacco Use   Smoking status: Never   Smokeless tobacco: Never  Vaping Use   Vaping status: Never Used  Substance Use Topics   Alcohol use: No    Alcohol/week: 0.0 standard drinks of alcohol   Drug use: No     Allergies   2,4-d dimethylamine; Diphenhydramine; Doxycycline; Ibuprofen; Morphine; Morphine and codeine; Nsaids; Sulfa antibiotics; Tetanus immune globulin; Tetanus toxoids; Diclofenac; and Buspirone   Review of Systems Review of Systems  Constitutional:  Positive for chills, fatigue and fever. Negative for diaphoresis.  HENT:  Positive for congestion, rhinorrhea and sore throat. Negative for ear pain, sinus pressure and sinus pain.   Respiratory:  Positive for cough. Negative for shortness of breath.   Cardiovascular:  Negative for chest pain.  Gastrointestinal:  Negative for abdominal pain, nausea and vomiting.  Musculoskeletal:  Positive for arthralgias and myalgias.  Skin:  Negative for rash.  Neurological:  Positive for headaches. Negative for weakness.  Hematological:  Negative for adenopathy.     Physical Exam Triage Vital Signs ED Triage Vitals  Encounter Vitals Group     BP 11/26/23 1445 (!) 144/106     Systolic BP Percentile --      Diastolic BP Percentile --      Pulse Rate 11/26/23 1445 80     Resp 11/26/23 1445 16     Temp 11/26/23 1445 (!) 102.6 F (39.2 C)     Temp Source 11/26/23 1445 Oral     SpO2 11/26/23 1445 99 %     Weight 11/26/23 1442 240 lb (108.9 kg)     Height 11/26/23 1442 5\' 6"  (1.676 m)     Head Circumference --      Peak Flow --      Pain Score 11/26/23 1447 4     Pain Loc --      Pain Education --      Exclude from Growth Chart --    No data found.  Updated Vital Signs BP (!) 144/106 (BP Location: Left Arm)   Pulse 80   Temp (!) 102.6 F (39.2 C) (Oral)   Resp 16   Ht 5\' 6"  (1.676 m)   Wt 240 lb (108.9 kg)   SpO2 99%   BMI 38.74 kg/m   Physical Exam Vitals and nursing note reviewed.  Constitutional:       General: She is not in acute distress.    Appearance: Normal appearance. She is ill-appearing.  She is not toxic-appearing.  HENT:     Head: Normocephalic and atraumatic.     Nose: Congestion present.     Mouth/Throat:     Mouth: Mucous membranes are moist.     Pharynx: Oropharynx is clear. Posterior oropharyngeal erythema present.  Eyes:     General: No scleral icterus.       Right eye: No discharge.        Left eye: No discharge.     Conjunctiva/sclera: Conjunctivae normal.  Cardiovascular:     Rate and Rhythm: Normal rate and regular rhythm.     Heart sounds: Normal heart sounds.  Pulmonary:     Effort: Pulmonary effort is normal. No respiratory distress.     Breath sounds: Normal breath sounds.  Musculoskeletal:     Cervical back: Neck supple.  Skin:    General: Skin is dry.  Neurological:     General: No focal deficit present.     Mental Status: She is alert. Mental status is at baseline.     Motor: No weakness.     Gait: Gait normal.  Psychiatric:        Mood and Affect: Mood normal.        Behavior: Behavior normal.      UC Treatments / Results  Labs (all labs ordered are listed, but only abnormal results are displayed) Labs Reviewed  RESP PANEL BY RT-PCR (FLU A&B, COVID) ARPGX2 - Abnormal; Notable for the following components:      Result Value   Influenza A by PCR POSITIVE (*)    All other components within normal limits    EKG   Radiology No results found.  Procedures Procedures (including critical care time)  Medications Ordered in UC Medications  acetaminophen (TYLENOL) tablet 975 mg (975 mg Oral Given 11/26/23 1526)    Initial Impression / Assessment and Plan / UC Course  I have reviewed the triage vital signs and the nursing notes.  Pertinent labs & imaging results that were available during my care of the patient were reviewed by me and considered in my medical decision making (see chart for details).   63 year old female presents for  2-day history of fever, fatigue, cough, congestion, body aches, headaches and sore throat.  Blood pressure elevated at 144/106.  Temp elevated at 102.6 degrees.  Patient was given Tylenol by nursing staff for fever.  On exam she is ill-appearing but nontoxic.  Has nasal congestion and mild posterior pharyngeal erythema.  Chest is clear.  Heart regular rate and rhythm.  Respiratory panel obtained.  Positive influenza A.  Reviewed results patient.  Sent Tamiflu to pharmacy.  Also sent Promethazine DM.  Reviewed current CDC guidelines, isolation protocol and ED precautions for flu.  Work note given.  Acute illness with systemic symptoms.   Final Clinical Impressions(s) / UC Diagnoses   Final diagnoses:  Influenza A  Acute cough  Nasal congestion     Discharge Instructions      -Positive for influenza A. - I sent Tamiflu to the pharmacy. - I also sent cough medicine.  Increase rest and fluids. - Tylenol as needed for fever but you should be breaking the fever in a couple more days. - Need to be seen again if uncontrolled fever, weakness or shortness of breath.  Flu symptoms can last for a couple weeks.     ED Prescriptions     Medication Sig Dispense Auth. Provider   oseltamivir (TAMIFLU) 75 MG capsule Take 1 capsule (75  mg total) by mouth every 12 (twelve) hours for 5 days. 10 capsule Shirlee Latch, PA-C   promethazine-dextromethorphan (PROMETHAZINE-DM) 6.25-15 MG/5ML syrup Take 5 mLs by mouth 4 (four) times daily as needed. 118 mL Shirlee Latch, PA-C      I have reviewed the PDMP during this encounter.   Shirlee Latch, PA-C 11/26/23 1550

## 2023-11-27 ENCOUNTER — Ambulatory Visit: Payer: Self-pay
